# Patient Record
Sex: Male | Born: 1990 | Race: Black or African American | Hispanic: No | Marital: Single | Smoking: Current every day smoker
Health system: Southern US, Community
[De-identification: ages and names within clinical notes are randomized; demographics above are authoritative.]

## PROBLEM LIST (undated history)

## (undated) ENCOUNTER — Emergency Department (HOSPITAL_COMMUNITY): Discharge status: Absent without leave | Payer: PRIVATE HEALTH INSURANCE

## (undated) DIAGNOSIS — K121 Other forms of stomatitis: Secondary | ICD-10-CM

## (undated) DIAGNOSIS — Z22322 Carrier or suspected carrier of Methicillin resistant Staphylococcus aureus: Secondary | ICD-10-CM

## (undated) DIAGNOSIS — K1379 Other lesions of oral mucosa: Secondary | ICD-10-CM

## (undated) DIAGNOSIS — K1230 Oral mucositis (ulcerative), unspecified: Secondary | ICD-10-CM

## (undated) DIAGNOSIS — B009 Herpesviral infection, unspecified: Secondary | ICD-10-CM

## (undated) DIAGNOSIS — M352 Behcet's disease: Secondary | ICD-10-CM

## (undated) HISTORY — PX: BIOPSY PHARYNX: SUR141

---

## 2010-11-26 ENCOUNTER — Emergency Department (HOSPITAL_BASED_OUTPATIENT_CLINIC_OR_DEPARTMENT_OTHER)
Admission: EM | Admit: 2010-11-26 | Discharge: 2010-11-26 | Disposition: A | Discharge status: Home / Self Care | Payer: Medicaid Other | Attending: Emergency Medicine | Admitting: Emergency Medicine

## 2010-11-26 DIAGNOSIS — L989 Disorder of the skin and subcutaneous tissue, unspecified: Secondary | ICD-10-CM | POA: Insufficient documentation

## 2010-11-26 DIAGNOSIS — B009 Herpesviral infection, unspecified: Secondary | ICD-10-CM | POA: Insufficient documentation

## 2011-07-08 ENCOUNTER — Emergency Department (HOSPITAL_BASED_OUTPATIENT_CLINIC_OR_DEPARTMENT_OTHER)
Admission: EM | Admit: 2011-07-08 | Discharge: 2011-07-08 | Disposition: A | Discharge status: Home / Self Care | Payer: Self-pay | Attending: Emergency Medicine | Admitting: Emergency Medicine

## 2011-07-08 ENCOUNTER — Encounter: Payer: Self-pay | Admitting: *Deleted

## 2011-07-08 DIAGNOSIS — F17200 Nicotine dependence, unspecified, uncomplicated: Secondary | ICD-10-CM | POA: Insufficient documentation

## 2011-07-08 DIAGNOSIS — H00019 Hordeolum externum unspecified eye, unspecified eyelid: Secondary | ICD-10-CM | POA: Insufficient documentation

## 2011-07-08 MED ORDER — DOXYCYCLINE HYCLATE 100 MG PO CAPS: 100.0000 mg | ORAL_CAPSULE | Freq: Two times a day (BID) | ORAL | Status: AC

## 2011-07-08 MED ORDER — TOBRAMYCIN 0.3 % OP SOLN: 1.0000 [drp] | OPHTHALMIC | Status: AC

## 2011-07-08 NOTE — ED Provider Notes (Signed)
History     CSN: [id number] Arrival date & time: 07/08/2011  7:25 PM  Chief Complaint  Patient presents with  . Stye    (Consider location/radiation/quality/duration/timing/severity/associated sxs/prior treatment) Patient is a 20 y.o. male presenting with eye problem. The history is provided by the patient and the spouse. No language interpreter was used.  Eye Problem  This is a new problem. The current episode started more than 2 days ago. The problem occurs constantly. The problem has been gradually worsening. There is pain in the left eye. There was no injury mechanism. The pain is at a severity of 6/10. The pain is moderate. There is no history of trauma to the eye. There is no known exposure to pink eye. He does not wear contacts. Associated symptoms include eye redness. He has tried nothing for the symptoms. The treatment provided no relief.  Pt complains of swelling to left eyelid, pt has drainage from eye. Pt has a history of mrsa.  History reviewed. No pertinent past medical history.  History reviewed. No pertinent past surgical history.  History reviewed. No pertinent family history.  History  Substance Use Topics  . Smoking status: Current Some Day Smoker  . Smokeless tobacco: Not on file  . Alcohol Use: No      Review of Systems  Unable to perform ROS Eyes: Positive for redness.  All other systems reviewed and are negative.    Allergies  Review of patient's allergies indicates no known allergies.  Home Medications   Current Outpatient Rx  Name Route Sig Dispense Refill  . VALACYCLOVIR HCL 1 G PO TABS Oral Take 1,000 mg by mouth 2 (two) times daily.      Marland Kitchen VALACYCLOVIR HCL 500 MG PO TABS Oral Take 500 mg by mouth daily.        BP 141/91  Pulse 79  Temp(Src) 98.1 F (36.7 C) (Oral)  Resp 16  SpO2 100%  Physical Exam  Nursing note and vitals reviewed. Constitutional: He appears well-developed and well-nourished.  HENT:  Head: Normocephalic and  atraumatic.  Eyes: Conjunctivae and EOM are normal. Pupils are equal, round, and reactive to light. Left eye exhibits discharge.       Erythematous, swollen arealeft upper eyelid  Neck: Normal range of motion. Neck supple.  Cardiovascular: Normal rate.   Pulmonary/Chest: Effort normal.  Musculoskeletal: Normal range of motion.  Neurological: He is alert.  Skin: Skin is warm and dry.  Psychiatric: He has a normal mood and affect.    ED Course  Procedures (including critical care time)  Labs Reviewed - No data to display No results found.   No diagnosis found.    MDM  Pt counseled on stye and need fot follow up.        Langston Masker, [place] 07/08/11 2025

## 2011-07-08 NOTE — ED Notes (Signed)
Pt with left eye irritation x 2 weeks pt with drainage

## 2011-07-09 NOTE — ED Provider Notes (Signed)
Medical screening examination/treatment/procedure(s) were performed by non-physician practitioner and as supervising physician I was immediately available for consultation/collaboration.   Labarron Durnin A Glendon Dunwoody, MD 07/09/11 1746 

## 2011-07-17 ENCOUNTER — Emergency Department (HOSPITAL_COMMUNITY)
Admission: EM | Admit: 2011-07-17 | Discharge: 2011-07-17 | Disposition: A | Discharge status: Home / Self Care | Payer: Self-pay | Attending: Emergency Medicine | Admitting: Emergency Medicine

## 2011-07-17 DIAGNOSIS — B009 Herpesviral infection, unspecified: Secondary | ICD-10-CM | POA: Insufficient documentation

## 2011-07-17 LAB — URINALYSIS, ROUTINE W REFLEX MICROSCOPIC
Glucose, UA: NEGATIVE mg/dL
Ketones, ur: 15 mg/dL — AB
pH: 6 (ref 5.0–8.0)

## 2011-07-17 LAB — URINE MICROSCOPIC-ADD ON

## 2011-07-18 LAB — URINE CULTURE

## 2011-07-18 LAB — GC/CHLAMYDIA PROBE AMP, GENITAL: GC Probe Amp, Genital: NEGATIVE

## 2012-03-02 ENCOUNTER — Emergency Department (HOSPITAL_BASED_OUTPATIENT_CLINIC_OR_DEPARTMENT_OTHER)
Admission: EM | Admit: 2012-03-02 | Discharge: 2012-03-02 | Disposition: A | Discharge status: Home / Self Care | Payer: Self-pay | Attending: Emergency Medicine | Admitting: Emergency Medicine

## 2012-03-02 ENCOUNTER — Encounter (HOSPITAL_BASED_OUTPATIENT_CLINIC_OR_DEPARTMENT_OTHER): Payer: Self-pay | Admitting: Emergency Medicine

## 2012-03-02 DIAGNOSIS — K1370 Unspecified lesions of oral mucosa: Secondary | ICD-10-CM | POA: Insufficient documentation

## 2012-03-02 DIAGNOSIS — K121 Other forms of stomatitis: Secondary | ICD-10-CM

## 2012-03-02 DIAGNOSIS — Z8614 Personal history of Methicillin resistant Staphylococcus aureus infection: Secondary | ICD-10-CM | POA: Insufficient documentation

## 2012-03-02 DIAGNOSIS — F17200 Nicotine dependence, unspecified, uncomplicated: Secondary | ICD-10-CM | POA: Insufficient documentation

## 2012-03-02 HISTORY — DX: Carrier or suspected carrier of methicillin resistant Staphylococcus aureus: Z22.322

## 2012-03-02 MED ORDER — ACYCLOVIR 400 MG PO TABS: 400.0000 mg | ORAL_TABLET | Freq: Three times a day (TID) | ORAL | Status: AC

## 2012-03-02 MED ORDER — LIDOCAINE VISCOUS 2 % MT SOLN: 20.0000 mL | OROMUCOSAL | Status: AC | PRN

## 2012-03-02 MED ORDER — IBUPROFEN 800 MG PO TABS: 800.0000 mg | ORAL_TABLET | Freq: Three times a day (TID) | ORAL | Status: AC

## 2012-03-02 NOTE — ED Notes (Signed)
Pt having mouth ulcers for three days.  Pt states he has been tested in the past and was told he had MRSA.  Pt states ulcers are painful and difficult to eat.

## 2012-03-02 NOTE — ED Provider Notes (Signed)
History     CSN: [id number]  Arrival date & time 03/02/12  1141   First MD Initiated Contact with Patient 03/02/12 1206      12:10 PM Pt reports oral lesions forming for 3 days. States initially felt like burning in his moth. Reports previously diagnosed with oral herpes. States usually lesions begin in his mouth and then start on his lips. States he does not have a pcp after losing his insurance.  Patient is a 21 y.o. male presenting with mouth sores.  Mouth Lesions  The current episode started 3 to 5 days ago. The onset was gradual. The problem occurs continuously. The problem has been gradually worsening. The problem is moderate. The symptoms are relieved by nothing. The symptoms are aggravated by eating and drinking. Associated symptoms include mouth sores. Pertinent negatives include no fever, no nausea, no vomiting, no congestion, no headaches, no rhinorrhea, no sore throat, no swollen glands and no cough. He has been drinking less than usual and eating less than usual.    Past Medical History  Diagnosis Date  . Mouth ulcers   . MRSA (methicillin resistant staph aureus) culture positive     History reviewed. No pertinent past surgical history.  History reviewed. No pertinent family history.  History  Substance Use Topics  . Smoking status: Current Some Day Smoker  . Smokeless tobacco: Not on file  . Alcohol Use: No      Review of Systems  Constitutional: Negative for fever.  HENT: Positive for mouth sores. Negative for congestion, sore throat, rhinorrhea and trouble swallowing.   Respiratory: Negative for cough.   Gastrointestinal: Negative for nausea and vomiting.  Neurological: Negative for headaches.  All other systems reviewed and are negative.    Allergies  Review of patient's allergies indicates no known allergies.  Home Medications  No current outpatient prescriptions on file.  BP 127/99  Pulse 84  Temp(Src) 97.9 F (36.6 C) (Oral)  Resp 14  Ht 6'  (1.829 m)  Wt 155 lb (70.308 kg)  BMI 21.02 kg/m2  SpO2 100%  Physical Exam  Constitutional: He is oriented to person, place, and time. He appears well-developed and well-nourished.  HENT:  Head: Normocephalic and atraumatic.  Right Ear: Tympanic membrane, external ear and ear canal normal.  Left Ear: Tympanic membrane, external ear and ear canal normal.  Nose: Nose normal.  Mouth/Throat: Oropharynx is clear and moist and mucous membranes are normal. Oral lesions (small blister on right tongue. Small ulcerations on right buccal mucosa. ) present.  Eyes: Pupils are equal, round, and reactive to light.  Neurological: He is alert and oriented to person, place, and time.  Skin: Skin is warm and dry. No rash noted. No erythema. No pallor.  Psychiatric: He has a normal mood and affect. His behavior is normal.    ED Course  Procedures   MDM   Treat patient with acyclovir, viscous lidocaine, and resource guide and referral for community clinic in [facility]. Advised preventive measures at the first sign of herpes outbreak but he must followup with his primary care physician. Patient and family agree with plan and are ready for discharge     Thomasene Lot, Cordelia Poche 03/02/12 1240

## 2012-03-02 NOTE — Discharge Instructions (Signed)
RESOURCE GUIDE  Dental Problems  Patients with Medicaid: [facility]                     [phone] [address]                                           Phone:  [phone]                                                  If unable to pay or uninsured, contact:  [facility] or [facility]. to become qualified for the adult dental clinic.  Chronic Pain Problems Contact [facility]  [phone] Patients need to be referred by their primary care doctor.  Insufficient Money for Medicine Contact [employer] Way:  call "211" or [facility] [phone].  No Primary Care Doctor Call [facility]  [phone] Other agencies that provide inexpensive medical care    [facility]  [phone]    [facility]  [phone]    [facility]  [phone]    [facility]  [phone]    [facility]  [phone]    [facility]  [phone]  [facility] clinic of [facility] [address] [place], [place] [address] Telephone number [phone]  Substance Abuse Resources [facility] Drug Services  [phone] [facility] [phone] [facility] [phone] [facility] [phone] Residential & Outpatient Substance Abuse Program  [phone]  Psychological Services [facility] Behavioral Health  [phone] [facility]  [phone] [facility]   [phone] (emergency services [phone])  Abuse/Neglect [facility] Child Abuse Hotline [phone] [facility] Child Abuse Hotline [phone] (After Hours)  Emergency Shelter [facility] [phone]  Maternity Homes [facility] [place] [facility] [phone] [facility] [phone]  MRSA Hotline #:   401-130-0138    [facility] Resources  [facility]  [employer]                           [facility] Dept. [address]  [place]                     [address]         [address] [place] [address]  Blondell Reveal Phone:  [phone]                                  Phone:  [phone]                   Phone:  [phone]  [facility] Mental Health Phone:  [phone]  [facility] Child Abuse Hotline [phone] [phone] (After Hours)

## 2012-03-03 NOTE — ED Provider Notes (Signed)
History/physical exam/procedure(s) were performed by non-physician practitioner and as supervising physician I was immediately available for consultation/collaboration. I have reviewed all notes and am in agreement with care and plan.   Hilario Quarry, MD 03/03/12 [phone]

## 2012-03-08 ENCOUNTER — Encounter (HOSPITAL_BASED_OUTPATIENT_CLINIC_OR_DEPARTMENT_OTHER): Payer: Self-pay

## 2012-03-08 ENCOUNTER — Emergency Department (HOSPITAL_BASED_OUTPATIENT_CLINIC_OR_DEPARTMENT_OTHER)
Admission: EM | Admit: 2012-03-08 | Discharge: 2012-03-08 | Disposition: A | Discharge status: Home / Self Care | Payer: Self-pay | Attending: Emergency Medicine | Admitting: Emergency Medicine

## 2012-03-08 DIAGNOSIS — Z8614 Personal history of Methicillin resistant Staphylococcus aureus infection: Secondary | ICD-10-CM | POA: Insufficient documentation

## 2012-03-08 DIAGNOSIS — F17200 Nicotine dependence, unspecified, uncomplicated: Secondary | ICD-10-CM | POA: Insufficient documentation

## 2012-03-08 DIAGNOSIS — K1379 Other lesions of oral mucosa: Secondary | ICD-10-CM

## 2012-03-08 DIAGNOSIS — K1370 Unspecified lesions of oral mucosa: Secondary | ICD-10-CM | POA: Insufficient documentation

## 2012-03-08 HISTORY — DX: Herpesviral infection, unspecified: B00.9

## 2012-03-08 MED ORDER — HYDROCODONE-ACETAMINOPHEN 5-325 MG PO TABS: 2.0000 | ORAL_TABLET | ORAL | Status: DC | PRN

## 2012-03-08 MED ORDER — HYDROCODONE-ACETAMINOPHEN 5-325 MG PO TABS
2.0000 | ORAL_TABLET | Freq: Once | ORAL | Status: AC
  Administered 2012-03-08: 2 via ORAL
  Filled 2012-03-08: qty 2

## 2012-03-08 NOTE — ED Provider Notes (Signed)
History     CSN: [id number]  Arrival date & time 03/08/12  1948   First MD Initiated Contact with Patient 03/08/12 2009      Chief Complaint  Patient presents with  . Mouth Lesions    (Consider location/radiation/quality/duration/timing/severity/associated sxs/prior treatment) Patient is a 21 y.o. male presenting with mouth sores. The history is provided by the patient. No language interpreter was used.  Mouth Lesions  The current episode started 5 to 7 days ago. The problem occurs continuously. The problem has been rapidly worsening. The problem is moderate. The symptoms are relieved by nothing. The symptoms are aggravated by nothing. Associated symptoms include mouth sores. He has been drinking less than usual.  Pt complains of a mouth pain.   Pt has a history of oral herpes.   Pt reports worse today,  Can not drink because of pain.   Past Medical History  Diagnosis Date  . Mouth ulcers   . MRSA (methicillin resistant staph aureus) culture positive   . Herpes     History reviewed. No pertinent past surgical history.  No family history on file.  History  Substance Use Topics  . Smoking status: Current Everyday Smoker  . Smokeless tobacco: Not on file  . Alcohol Use: No      Review of Systems  HENT: Positive for mouth sores.   Skin: Positive for wound.  All other systems reviewed and are negative.    Allergies  Review of patient's allergies indicates no known allergies.  Home Medications   Current Outpatient Rx  Name Route Sig Dispense Refill  . ACYCLOVIR 400 MG PO TABS Oral Take 1 tablet (400 mg total) by mouth 3 (three) times daily. 30 tablet 0  . IBUPROFEN 800 MG PO TABS Oral Take 1 tablet (800 mg total) by mouth 3 (three) times daily. 21 tablet 0  . LIDOCAINE VISCOUS 2 % MT SOLN Oral Take 20 mLs by mouth as needed for pain. 100 mL 0    Gargle swish and spit out q 3 hours, prn pain     BP 111/84  Pulse 106  Temp(Src) 98.4 F (36.9 C) (Oral)  Resp 16   Ht 6' (1.829 m)  Wt 140 lb (63.504 kg)  BMI 18.99 kg/m2  SpO2 98%  Physical Exam  Nursing note and vitals reviewed. Constitutional: He is oriented to person, place, and time. He appears well-developed and well-nourished.  HENT:  Head: Normocephalic and atraumatic.  Nose: Nose normal.       Multiple oral lesions scabbed areas and new blisters,  Eyes: Conjunctivae and EOM are normal. Pupils are equal, round, and reactive to light.  Neck: Normal range of motion. Neck supple.  Cardiovascular: Normal rate and normal heart sounds.   Pulmonary/Chest: Effort normal.  Musculoskeletal: Normal range of motion.  Neurological: He is alert and oriented to person, place, and time.  Skin: Skin is warm and dry.  Psychiatric: He has a normal mood and affect.    ED Course  Procedures (including critical care time)  Labs Reviewed - No data to display No results found.   No diagnosis found.    MDM  Cont acyclovir.   Pt given 2 vicodin and rx for 10.   Primary care referrals and note for job.        Lonia Skinner Glendo, [place] 03/08/12 2025

## 2012-03-08 NOTE — ED Notes (Signed)
C/o mouth lesions since 02/28/12-was seen here taking meds but states he can not eat or drink and can not work-wife is talking on pt behalf due to pt c/o pain when talks

## 2012-03-08 NOTE — Discharge Instructions (Signed)
Oral Ulcers  Oral ulcers are painful, shallow sores around the lining of the mouth. They can affect the gums, the inside of the lips and the cheeks (sores on the outside of the lips and on the face are different). They typically first occur in school aged children and teenagers. Oral ulcers may also be called canker sores or cold sores.  CAUSES   Canker sores and cold sores can be caused by many factors including:  · Infection.  · Injury.  · Sun exposure.  · Medications.  · Emotional stress.  · Food allergies.  · Vitamin deficiencies.  · Toothpastes containing sodium lauryl sulfate.  The Herpes Virus can be the cause of mouth ulcers. The first infection can be severe and cause 10 or more ulcers on the gums, tongue and lips with fever and difficulty in swallowing. This infection usually occurs between the ages of 1 and 3 years.   SYMPTOMS   The typical sore is about ¼ inch (6 mm) in size, is an oval or round ulcer with red borders.  DIAGNOSIS   Your caregiver can diagnose simple oral ulcers by examination. Additional testing is usually not required.   TREATMENT   Treatment is aimed at pain relief. Generally, oral ulcers resolve by themselves within 1 to 2 weeks without medication and are not contagious unless caused by Herpes (and other viruses). Antibiotics are not effective with mouth sores. Avoid direct contact with others until the ulcer is completely healed. See your caregiver for follow-up care as recommended. Also:  · Offer a soft diet.  · Encourage plenty of fluids to prevent dehydration. Popsicles and milk shakes can be helpful.  · Avoid acidic and salty foods and drinks such as orange juice.  · Infants and young children will often refuse to drink because of pain. Using a teaspoon, cup or syringe to give small amounts of fluids frequently can help prevent dehydration.  · Cold compresses on the face may help reduce pain.  · Pain medication can help control soreness.  · A solution of diphenhydramine mixed  with a liquid antacid can be useful to decrease the soreness of ulcers. Consult a caregiver for the dosing.  · Liquids or ointments with a numbing ingredient may be helpful when used as recommended.  · Older children and teenagers can rinse their mouth with a salt-water mixture (1/2 teaspoonof salt in 8 ounces of water) four times a day. This treatment is uncomfortable but may reduce the time the ulcers are present.  · There are many over the counter throat lozenges and medications available for oral ulcers. There effectiveness has not been studied.  · Consult your medical caregiver prior to using homeopathic treatments for oral ulcers.  SEEK MEDICAL CARE IF:   · You think your child needs to be seen.  · The pain worsens and you cannot control it.  · There are 4 or more ulcers.  · The lips and gums begin to bleed and crust.  · A single mouth ulcer is near a tooth that is causing a toothache or pain.  · Your child has a fever, swollen face, or swollen glands.  · The ulcers began after starting a medication.  · Mouth ulcers keep re-occurring or last more than 2 weeks.  · You think your child is not taking adequate fluids.  SEEK IMMEDIATE MEDICAL CARE IF:   · Your child has a high fever.  · Your child is unable to swallow or becomes dehydrated.  ·   a chemical your child accidentally put in their mouth.  Document Released: 11/02/2004 Document Revised: 09/14/2011 Document Reviewed: 06/17/2009 [facility] Patient Information 2012 [place], [place]. Go to www.goodrx.com to look up your medications. This will give you a list of where you can find your prescriptions at the most affordable prices.   Call [facility]  [phone]  If you have no primary doctor, here are some resources that may be helpful:  Medicaid-accepting [facility] Providers:   - [facility]- [address]      [phone]      Mon-Fri 9am-7pm, Sat 9am-1pm   - [facility]- [address] [place]      [phone]   - [facility]- [address] [place]      [phone]   [facility]- [address]      [phone]   - Renaye Rakers- [address] [place], [address]      [phone]      Only accepts [place] patients       after they have her name applied to their card   Self Pay (no insurance) in [place]:   - Sickle Cell Patients: Dr Willey Blade, [facility]      [address] [place]      [phone]   - [facility] [phone]   - Physician Referral Service- [phone]   - [facility]- [address] [place]      [phone]   [facility]- [address]   - [facility]- see information above      (Speak to [employer] if you do not have insurance)   - [facility]- [address] [place]      [phone]   - [facility] [place]- [address]      [phone]   - [facility]- [address]      [phone]   - Dr Julio Sicks-  [address], [place]      [phone]   - [facility]- [address]      [phone]   - [facility]- [address]      [phone]      Also [address]      [phone]   - [facility]- [address]      [phone]      1st and 3rd Saturday every month, 10am-1pm    Other agencies that provide inexpensive medical care:     [facility]  [phone]    [facility] Internal Medicine  [phone]    [facility]  [phone]    [facility]  [phone] [address] [place] [address]    [facility]  [phone]    [facility]  [phone] [facility] [phone]   [address] [place], [place] [address]  Chronic Pain Problems Contact [facility]  [phone] Patients need to be referred by their primary care doctor.  [facility] of [place]     [employer]  [facility] Dept. [address] [place]                       [address]      [address] [place] [address]   [phone] (After Hours)  General Information: Finding a doctor when you do not have health insurance can be tricky. Although you are not limited by an insurance plan, you are of course limited by her finances and how much but he can pay out of pocket.  What are your options if you don't have health insurance?   1) Find a Librarian, academic and Pay Out of Pocket Although you won't have to find out who is covered by your insurance plan, it is a good idea to ask around and get recommendations. You will then need to call the office and see if the doctor you have chosen will accept you as a new patient and what types of options they offer for patients who are self-pay. Some doctors offer discounts or will set up payment plans for their patients who do not have insurance, but you will need to ask so you aren't surprised when you get to your appointment.  2) Contact Your Local Health Department Not all health departments have doctors that can see patients for sick visits, but many do, so it is worth a call to see if yours does. If you don't know where your local health department is, you can check in your phone book. The CDC also has a tool to help you locate your state's health department, and many state websites also have listings of all of their local health departments.  3) Find a Walk-in Clinic If your illness is not likely to be very severe or complicated, you may want to try a walk in clinic. These are popping up all over the country in pharmacies, drugstores, and shopping centers. They're usually staffed by nurse practitioners or physician assistants that have been trained to treat  common illnesses and complaints. They're usually fairly quick and inexpensive. However, if you have serious medical issues or chronic medical problems, these are probably not your best option

## 2012-03-08 NOTE — ED Provider Notes (Signed)
Medical screening examination/treatment/procedure(s) were performed by non-physician practitioner and as supervising physician I was immediately available for consultation/collaboration.  Evadne Ose, MD 03/08/12 2050 

## 2012-03-08 NOTE — ED Notes (Signed)
Per patient's wife, pt has had these lesions since he was 23 and they have always gone away with medication, pt states he has been taking the acyclovir medication and using lidocaine but patient cont. To be unable to eat and drink

## 2012-04-17 ENCOUNTER — Emergency Department (HOSPITAL_BASED_OUTPATIENT_CLINIC_OR_DEPARTMENT_OTHER)
Admission: EM | Admit: 2012-04-17 | Discharge: 2012-04-17 | Disposition: A | Discharge status: Home / Self Care | Payer: Self-pay | Attending: Emergency Medicine | Admitting: Emergency Medicine

## 2012-04-17 ENCOUNTER — Encounter (HOSPITAL_BASED_OUTPATIENT_CLINIC_OR_DEPARTMENT_OTHER): Payer: Self-pay | Admitting: *Deleted

## 2012-04-17 DIAGNOSIS — F17200 Nicotine dependence, unspecified, uncomplicated: Secondary | ICD-10-CM | POA: Insufficient documentation

## 2012-04-17 DIAGNOSIS — Z8614 Personal history of Methicillin resistant Staphylococcus aureus infection: Secondary | ICD-10-CM | POA: Insufficient documentation

## 2012-04-17 DIAGNOSIS — B009 Herpesviral infection, unspecified: Secondary | ICD-10-CM | POA: Insufficient documentation

## 2012-04-17 MED ORDER — HYDROCODONE-ACETAMINOPHEN 5-500 MG PO TABS: 1.0000 | ORAL_TABLET | Freq: Four times a day (QID) | ORAL | Status: AC | PRN

## 2012-04-17 MED ORDER — ACYCLOVIR 400 MG PO TABS: 400.0000 mg | ORAL_TABLET | Freq: Four times a day (QID) | ORAL | Status: AC

## 2012-04-17 NOTE — ED Notes (Signed)
MD at bedside. 

## 2012-04-17 NOTE — ED Notes (Signed)
Pt c/o herpes in mouth , seen here x 1 month ago for same

## 2012-04-17 NOTE — ED Provider Notes (Signed)
History     CSN: [id number]  Arrival date & time 04/17/12  2018   First MD Initiated Contact with Patient 04/17/12 2106      Chief Complaint  Patient presents with  . Herpes Zoster    (Consider location/radiation/quality/duration/timing/severity/associated sxs/prior treatment) HPI Comments: Patient presents with complaints of mouth pain. He has a history of oral herpes and presents with about a 3 week history of new oral lesions. He was treated in May with acyclovir for the same lesions. He states she's had multiple recurrent episodes of oral herpes. He has blistering on his tongue and soreness on his tongue with difficulty eating due to the blistering and soreness. He denies any sore throat, fever, vomiting, diarrhea, or other skin lesions. Denies any genital lesion  The history is provided by the patient.    Past Medical History  Diagnosis Date  . Mouth ulcers   . MRSA (methicillin resistant staph aureus) culture positive   . Herpes     History reviewed. No pertinent past surgical history.  History reviewed. No pertinent family history.  History  Substance Use Topics  . Smoking status: Current Everyday Smoker  . Smokeless tobacco: Not on file  . Alcohol Use: No      Review of Systems  Constitutional: Negative for fever, chills, diaphoresis and fatigue.  HENT: Positive for mouth sores. Negative for congestion, rhinorrhea and sneezing.   Eyes: Negative.   Respiratory: Negative for cough, chest tightness and shortness of breath.   Cardiovascular: Negative for chest pain and leg swelling.  Gastrointestinal: Negative for nausea, vomiting, abdominal pain, diarrhea and blood in stool.  Genitourinary: Negative for frequency, hematuria, flank pain and difficulty urinating.  Musculoskeletal: Negative for back pain and arthralgias.  Skin: Negative for rash.  Neurological: Negative for dizziness, speech difficulty, weakness, numbness and headaches.    Allergies  Review of  patient's allergies indicates no known allergies.  Home Medications   Current Outpatient Rx  Name Route Sig Dispense Refill  . VALACYCLOVIR HCL 1 G PO TABS Oral Take 1,000 mg by mouth 2 (two) times daily.    . ACYCLOVIR 400 MG PO TABS Oral Take 1 tablet (400 mg total) by mouth 4 (four) times daily. 20 tablet 0  . HYDROCODONE-ACETAMINOPHEN 5-500 MG PO TABS Oral Take 1-2 tablets by mouth every 6 (six) hours as needed for pain. 15 tablet 0    BP 117/86  Pulse 71  Temp 98 F (36.7 C) (Oral)  Ht 6' (1.829 m)  Wt 150 lb (68.04 kg)  BMI 20.34 kg/m2  SpO2 100%  Physical Exam  Constitutional: He is oriented to person, place, and time. He appears well-developed and well-nourished.  HENT:  Head: Normocephalic and atraumatic.  Mouth/Throat: Oropharynx is clear and moist.       Patient has multiple blisters on his tongue. Some are older and scabbed over and some are  fluid filled blisters.  Eyes: Pupils are equal, round, and reactive to light.  Neck: Normal range of motion. Neck supple.  Cardiovascular: Normal rate, regular rhythm and normal heart sounds.   Pulmonary/Chest: Effort normal and breath sounds normal. No respiratory distress. He has no wheezes. He has no rales. He exhibits no tenderness.  Abdominal: Soft. Bowel sounds are normal. There is no tenderness. There is no rebound and no guarding.  Musculoskeletal: Normal range of motion. He exhibits no edema.  Lymphadenopathy:    He has no cervical adenopathy.  Neurological: He is alert and oriented to person, place, and  time.  Skin: Skin is warm and dry. No rash noted.  Psychiatric: He has a normal mood and affect.    ED Course  Procedures (including critical care time)  Labs Reviewed - No data to display No results found.   1. Herpes simplex virus infection       MDM  Patient presents with a recurrence of his oral herpes. Will start acyclovir. He does not appear to be any distress. Does not appear to be dehydrated. He  has no difficulty swallowing or controlling his secretions        [name], MD 04/17/12 2209

## 2012-04-17 NOTE — ED Notes (Signed)
DR Belfi at bedside. 

## 2012-06-16 ENCOUNTER — Encounter (HOSPITAL_BASED_OUTPATIENT_CLINIC_OR_DEPARTMENT_OTHER): Payer: Self-pay | Admitting: *Deleted

## 2012-06-16 DIAGNOSIS — K6289 Other specified diseases of anus and rectum: Secondary | ICD-10-CM | POA: Insufficient documentation

## 2012-06-16 NOTE — ED Notes (Signed)
Pt. States that he had  "a little" rectal bleeding today after he had a bowel movment with pain to the rectal area.

## 2012-06-17 ENCOUNTER — Emergency Department (HOSPITAL_BASED_OUTPATIENT_CLINIC_OR_DEPARTMENT_OTHER)
Admission: EM | Admit: 2012-06-17 | Discharge: 2012-06-17 | Disposition: A | Discharge status: Home / Self Care | Payer: Self-pay | Attending: Emergency Medicine | Admitting: Emergency Medicine

## 2012-07-03 ENCOUNTER — Emergency Department (HOSPITAL_BASED_OUTPATIENT_CLINIC_OR_DEPARTMENT_OTHER)
Admission: EM | Admit: 2012-07-03 | Discharge: 2012-07-03 | Disposition: A | Discharge status: Home / Self Care | Payer: Medicaid Other | Attending: Emergency Medicine | Admitting: Emergency Medicine

## 2012-07-03 ENCOUNTER — Encounter (HOSPITAL_BASED_OUTPATIENT_CLINIC_OR_DEPARTMENT_OTHER): Payer: Self-pay

## 2012-07-03 DIAGNOSIS — Z8614 Personal history of Methicillin resistant Staphylococcus aureus infection: Secondary | ICD-10-CM | POA: Insufficient documentation

## 2012-07-03 DIAGNOSIS — B001 Herpesviral vesicular dermatitis: Secondary | ICD-10-CM

## 2012-07-03 DIAGNOSIS — F17200 Nicotine dependence, unspecified, uncomplicated: Secondary | ICD-10-CM | POA: Insufficient documentation

## 2012-07-03 DIAGNOSIS — B009 Herpesviral infection, unspecified: Secondary | ICD-10-CM | POA: Insufficient documentation

## 2012-07-03 MED ORDER — OXYCODONE-ACETAMINOPHEN 5-325 MG PO TABS: 1.0000 | ORAL_TABLET | ORAL | Status: DC | PRN

## 2012-07-03 MED ORDER — ACYCLOVIR 400 MG PO TABS: 400.0000 mg | ORAL_TABLET | Freq: Three times a day (TID) | ORAL | Status: DC

## 2012-07-03 NOTE — ED Notes (Signed)
Patient here with reported outbreak of herpes x 3 days. Reports only in mouth and lips, pain with speaking and swallowing

## 2012-07-03 NOTE — ED Provider Notes (Addendum)
History     CSN: [id number]  Arrival date & time 07/03/12  1507   First MD Initiated Contact with Patient 07/03/12 1556      Chief Complaint  Patient presents with  . Rash    (Consider location/radiation/quality/duration/timing/severity/associated sxs/prior treatment) Patient is a 21 y.o. male presenting with rash. The history is provided by the patient.  Rash   He has an outbreak of oral herpes infection which has been present for the last 3 days. He has had numerous episodes of the same infection. Oral antiviral medication is usually effective. Pain is severe and he rates it at 8/10. He is not having any difficulty swallowing. He denies fever or chills.  Past Medical History  Diagnosis Date  . Mouth ulcers   . MRSA (methicillin resistant staph aureus) culture positive   . Herpes     History reviewed. No pertinent past surgical history.  No family history on file.  History  Substance Use Topics  . Smoking status: Current Every Day Smoker  . Smokeless tobacco: Not on file  . Alcohol Use: Yes     occ      Review of Systems  Skin: Positive for rash.  All other systems reviewed and are negative.    Allergies  Review of patient's allergies indicates no known allergies.  Home Medications   Current Outpatient Rx  Name Route Sig Dispense Refill  . VALACYCLOVIR HCL 1 G PO TABS Oral Take 1,000 mg by mouth 2 (two) times daily.      BP 116/79  Pulse 84  Temp 97.6 F (36.4 C)  Resp 16  Wt 150 lb (68.04 kg)  SpO2 99%  Physical Exam  Nursing note and vitals reviewed. 21 year old male, resting comfortably and in no acute distress. Vital signs are normal. Oxygen saturation is 99%, which is normal. Head is normocephalic and atraumatic. PERRLA, EOMI. Oropharynx is clear. Numerous vesicles are present on the lips with mild swelling of the lips. This is consistent with the herpes labialis. Pharynx has no swelling and there no intraoral lesions. Neck is nontender and  supple without adenopathy or JVD. Back is nontender and there is no CVA tenderness. Lungs are clear without rales, wheezes, or rhonchi. Chest is nontender. Heart has regular rate and rhythm without murmur. Abdomen is soft, flat, nontender without masses or hepatosplenomegaly and peristalsis is normoactive. Extremities have no cyanosis or edema, full range of motion is present. Skin is warm and dry without rash. Neurologic: Mental status is normal, cranial nerves are intact, there are no motor or sensory deficits.   ED Course  Procedures (including critical care time)  Labs Reviewed - No data to display No results found.   No diagnosis found.    MDM  Recurrent oral herpes infection. Review of old records shows several ED visits with the same presentation. He is sent home with prescription for acyclovir and Percocet for pain. Discuss with him the need for evaluation for immune system problems given his recurrent infections. I have also discussed with him possible need for chronic suppressive therapy.    Dione Booze, MD 07/03/12 1615  Dione Booze, MD 07/03/12 [phone]

## 2012-07-07 ENCOUNTER — Inpatient Hospital Stay (HOSPITAL_COMMUNITY)
Admission: EM | Admit: 2012-07-07 | Discharge: 2012-07-12 | DRG: 866 | Disposition: A | Discharge status: Home / Self Care | Payer: Medicaid Other | Attending: Internal Medicine | Admitting: Internal Medicine

## 2012-07-07 ENCOUNTER — Encounter (HOSPITAL_COMMUNITY): Payer: Self-pay

## 2012-07-07 DIAGNOSIS — N179 Acute kidney failure, unspecified: Secondary | ICD-10-CM

## 2012-07-07 DIAGNOSIS — Z9119 Patient's noncompliance with other medical treatment and regimen: Secondary | ICD-10-CM

## 2012-07-07 DIAGNOSIS — R739 Hyperglycemia, unspecified: Secondary | ICD-10-CM | POA: Diagnosis present

## 2012-07-07 DIAGNOSIS — Z8614 Personal history of Methicillin resistant Staphylococcus aureus infection: Secondary | ICD-10-CM

## 2012-07-07 DIAGNOSIS — Z91199 Patient's noncompliance with other medical treatment and regimen due to unspecified reason: Secondary | ICD-10-CM

## 2012-07-07 DIAGNOSIS — F17210 Nicotine dependence, cigarettes, uncomplicated: Secondary | ICD-10-CM | POA: Diagnosis present

## 2012-07-07 DIAGNOSIS — R7309 Other abnormal glucose: Secondary | ICD-10-CM | POA: Diagnosis present

## 2012-07-07 DIAGNOSIS — E860 Dehydration: Secondary | ICD-10-CM | POA: Diagnosis present

## 2012-07-07 DIAGNOSIS — B0089 Other herpesviral infection: Secondary | ICD-10-CM

## 2012-07-07 DIAGNOSIS — A6001 Herpesviral infection of penis: Secondary | ICD-10-CM | POA: Diagnosis present

## 2012-07-07 DIAGNOSIS — F17200 Nicotine dependence, unspecified, uncomplicated: Secondary | ICD-10-CM | POA: Diagnosis present

## 2012-07-07 DIAGNOSIS — B009 Herpesviral infection, unspecified: Secondary | ICD-10-CM

## 2012-07-07 LAB — COMPREHENSIVE METABOLIC PANEL
Albumin: 4.2 g/dL (ref 3.5–5.2)
Alkaline Phosphatase: 87 U/L (ref 39–117)
BUN: 15 mg/dL (ref 6–23)
Chloride: 94 mEq/L — ABNORMAL LOW (ref 96–112)
Glucose, Bld: 80 mg/dL (ref 70–99)
Potassium: 3.9 mEq/L (ref 3.5–5.1)
Total Bilirubin: 1.3 mg/dL — ABNORMAL HIGH (ref 0.3–1.2)

## 2012-07-07 LAB — PROTIME-INR: Prothrombin Time: 13.9 seconds (ref 11.6–15.2)

## 2012-07-07 LAB — CBC
HCT: 47.4 % (ref 39.0–52.0)
Hemoglobin: 17.2 g/dL — ABNORMAL HIGH (ref 13.0–17.0)
RDW: 11.6 % (ref 11.5–15.5)
WBC: 6.8 10*3/uL (ref 4.0–10.5)

## 2012-07-07 MED ORDER — METHYLPREDNISOLONE SODIUM SUCC 125 MG IJ SOLR
125.0000 mg | Freq: Once | INTRAMUSCULAR | Status: AC
  Administered 2012-07-07: 125 mg via INTRAVENOUS
  Filled 2012-07-07: qty 2

## 2012-07-07 MED ORDER — MAGIC MOUTHWASH
5.0000 mL | Freq: Once | ORAL | Status: AC
  Administered 2012-07-07: 5 mL via ORAL
  Filled 2012-07-07: qty 5

## 2012-07-07 MED ORDER — HYDROMORPHONE HCL PF 1 MG/ML IJ SOLN
1.0000 mg | Freq: Once | INTRAMUSCULAR | Status: AC
  Administered 2012-07-07: 1 mg via INTRAVENOUS
  Filled 2012-07-07: qty 1

## 2012-07-07 MED ORDER — DEXTROSE 5 % IV SOLN
10.0000 mg/kg | Freq: Once | INTRAVENOUS | Status: AC
  Administered 2012-07-07: 680 mg via INTRAVENOUS
  Filled 2012-07-07: qty 13.6

## 2012-07-07 MED ORDER — SODIUM CHLORIDE 0.9 % IV SOLN
Freq: Once | INTRAVENOUS | Status: AC
  Administered 2012-07-07: 23:00:00 via INTRAVENOUS

## 2012-07-07 MED ORDER — ONDANSETRON HCL 4 MG/2ML IJ SOLN
4.0000 mg | Freq: Once | INTRAMUSCULAR | Status: AC
  Administered 2012-07-07: 4 mg via INTRAVENOUS
  Filled 2012-07-07: qty 2

## 2012-07-07 NOTE — ED Provider Notes (Signed)
History     CSN: [id number]  Arrival date & time 07/07/12  [place]   First MD Initiated Contact with Patient 07/07/12 2138      Chief Complaint  Patient presents with  . SEXUALLY TRANSMITTED DISEASE    Herpes per patient    (Consider location/radiation/quality/duration/timing/severity/associated sxs/prior treatment) HPI Comments: Patient was seen 3 days ago, diagnosed with oral herpes started on a cycle.  There states he is been taking his Naprosyn with worsening oral lesions throughout his entire mouth.  He also has lesions on his left eyelid and diffusely over trunk and extremities or painful.  He is unable to eat or drink except to take his medications due to pain  The history is provided by the patient and the spouse.    Past Medical History  Diagnosis Date  . Mouth ulcers   . MRSA (methicillin resistant staph aureus) culture positive   . Herpes     History reviewed. No pertinent past surgical history.  History reviewed. No pertinent family history.  History  Substance Use Topics  . Smoking status: Current Every Day Smoker -- 0.2 packs/day    Types: Cigarettes  . Smokeless tobacco: Not on file  . Alcohol Use: Yes     occ      Review of Systems  Constitutional: Positive for fever. Negative for chills.  HENT: Positive for mouth sores and trouble swallowing.   Gastrointestinal: Negative for nausea and vomiting.  Skin: Positive for rash and wound.  Neurological: Positive for weakness. Negative for dizziness and headaches.    Allergies  Review of patient's allergies indicates no known allergies.  Home Medications   No current outpatient prescriptions on file.  BP 117/76  Pulse 76  Temp 97.3 F (36.3 C) (Oral)  Resp 16  Ht 6' (1.829 m)  Wt 135 lb 2.3 oz (61.3 kg)  BMI 18.33 kg/m2  SpO2 100%  Physical Exam  Constitutional: He appears well-developed and well-nourished. He appears distressed.  HENT:  Head: Normocephalic.  Mouth/Throat: Mucous membranes  are dry. Oral lesions present. Oropharyngeal exudate present.  Eyes: Pupils are equal, round, and reactive to light.       Lesion on L upper eye lid  Neck: Normal range of motion.  Cardiovascular: Normal rate.   Pulmonary/Chest: Effort normal.  Abdominal: Soft. He exhibits no distension.  Genitourinary: Rectum normal and penis normal.  Musculoskeletal: Normal range of motion. He exhibits no edema and no tenderness.  Skin: Rash noted.       Lesion on extremities, back, face neck     ED Course  Procedures (including critical care time)  Labs Reviewed  CBC - Abnormal; Notable for the following:    Hemoglobin 17.2 (*)     MCHC 36.3 (*)     All other components within normal limits  COMPREHENSIVE METABOLIC PANEL - Abnormal; Notable for the following:    Sodium 134 (*)     Chloride 94 (*)     Total Protein 9.1 (*)     Total Bilirubin 1.3 (*)     All other components within normal limits  BASIC METABOLIC PANEL - Abnormal; Notable for the following:    Sodium 132 (*)     Chloride 95 (*)     Glucose, Bld 121 (*)     Creatinine, Ser 1.43 (*)     GFR calc non Af Amer 68 (*)     GFR calc Af Amer 79 (*)     All other components within normal  limits  PROTIME-INR  RAPID HIV SCREEN ([facility])  HERPES SIMPLEX VIRUS(HSV) DNA BY PCR  HERPES SIMPLEX VIRUS CULTURE  BASIC METABOLIC PANEL   No results found.   1. Herpes simplex with complication   2. Acute kidney injury       MDM  disseminated Herpes        Arman Filter, NP 07/08/12 2317

## 2012-07-07 NOTE — ED Notes (Signed)
Pt presents with herpes sores external to mouth and lips and genital area x 2 weeks.  Pt with increased herpes to mouth x 3 days.  Pt is able to speak and swallow.  Pt has not been complaint with medications

## 2012-07-07 NOTE — ED Notes (Signed)
Pt from home c/o oral and skin lesions. Patient unable to speak, eat or drink at this time. Sts he has not eaten in 3 days. Patient oral membranes blistered and not intact. Patient wife sts they are unable to keep his medication up to date due to the expense of acyclovir. Patient also has lesions on his arms, shoulders and his left eye and a small amount on his genitals.

## 2012-07-08 ENCOUNTER — Encounter (HOSPITAL_COMMUNITY): Payer: Self-pay

## 2012-07-08 DIAGNOSIS — N179 Acute kidney failure, unspecified: Secondary | ICD-10-CM

## 2012-07-08 DIAGNOSIS — R739 Hyperglycemia, unspecified: Secondary | ICD-10-CM | POA: Diagnosis present

## 2012-07-08 DIAGNOSIS — B0089 Other herpesviral infection: Principal | ICD-10-CM

## 2012-07-08 DIAGNOSIS — B009 Herpesviral infection, unspecified: Secondary | ICD-10-CM

## 2012-07-08 DIAGNOSIS — F17210 Nicotine dependence, cigarettes, uncomplicated: Secondary | ICD-10-CM | POA: Diagnosis present

## 2012-07-08 LAB — BASIC METABOLIC PANEL
CO2: 24 mEq/L (ref 19–32)
Chloride: 95 mEq/L — ABNORMAL LOW (ref 96–112)
Glucose, Bld: 121 mg/dL — ABNORMAL HIGH (ref 70–99)
Sodium: 132 mEq/L — ABNORMAL LOW (ref 135–145)

## 2012-07-08 MED ORDER — DEXTROSE 5 % IV SOLN
680.0000 mg | Freq: Three times a day (TID) | INTRAVENOUS | Status: DC
  Administered 2012-07-08: 680 mg via INTRAVENOUS
  Filled 2012-07-08 (×2): qty 13.6

## 2012-07-08 MED ORDER — ONDANSETRON HCL 4 MG/2ML IJ SOLN: 4.0000 mg | Freq: Four times a day (QID) | INTRAMUSCULAR | Status: DC | PRN

## 2012-07-08 MED ORDER — ACETAMINOPHEN 325 MG PO TABS: 650.0000 mg | ORAL_TABLET | Freq: Four times a day (QID) | ORAL | Status: DC | PRN

## 2012-07-08 MED ORDER — MORPHINE SULFATE 2 MG/ML IJ SOLN
2.0000 mg | INTRAMUSCULAR | Status: DC | PRN
  Administered 2012-07-08 (×2): 2 mg via INTRAVENOUS
  Filled 2012-07-08 (×2): qty 1

## 2012-07-08 MED ORDER — ACETAMINOPHEN 650 MG RE SUPP: 650.0000 mg | Freq: Four times a day (QID) | RECTAL | Status: DC | PRN

## 2012-07-08 MED ORDER — ONDANSETRON HCL 4 MG PO TABS: 4.0000 mg | ORAL_TABLET | Freq: Four times a day (QID) | ORAL | Status: DC | PRN

## 2012-07-08 MED ORDER — LIDOCAINE VISCOUS 2 % MT SOLN
20.0000 mL | OROMUCOSAL | Status: DC | PRN
  Administered 2012-07-08 (×2): 20 mL via OROMUCOSAL
  Filled 2012-07-08 (×3): qty 20

## 2012-07-08 MED ORDER — HALOPERIDOL LACTATE 5 MG/ML IJ SOLN: 2.0000 mg | Freq: Four times a day (QID) | INTRAMUSCULAR | Status: DC | PRN

## 2012-07-08 MED ORDER — SODIUM CHLORIDE 0.9 % IV SOLN
INTRAVENOUS | Status: DC
  Administered 2012-07-08: 1000 mL via INTRAVENOUS
  Administered 2012-07-09 (×2): via INTRAVENOUS
  Administered 2012-07-10: 1000 mL via INTRAVENOUS
  Administered 2012-07-10 – 2012-07-12 (×5): via INTRAVENOUS

## 2012-07-08 MED ORDER — ACYCLOVIR SODIUM 50 MG/ML IV SOLN
10.0000 mg/kg | Freq: Three times a day (TID) | INTRAVENOUS | Status: DC
  Administered 2012-07-08 – 2012-07-12 (×13): 615 mg via INTRAVENOUS
  Filled 2012-07-08 (×14): qty 12.3

## 2012-07-08 NOTE — Progress Notes (Signed)
ANTIBIOTIC CONSULT NOTE - INITIAL  Pharmacy Consult for IV Acyclovir Indication: Disseminated Herpes Simplex  No Known Allergies  Patient Measurements: Height: 6' (182.9 cm) Weight: 150 lb (68.04 kg) IBW/kg (Calculated) : 77.6    Vital Signs: Temp: 98.5 F (36.9 C) (09/30 0016) Temp src: Oral (09/30 0016) BP: 129/82 mmHg (09/30 0016) Pulse Rate: 104  (09/30 0016) Intake/Output from previous day:   Intake/Output from this shift:    Labs:  Basename 07/07/12 2312  WBC 6.8  HGB 17.2*  PLT 207  LABCREA --  CREATININE 1.02   Estimated Creatinine Clearance: 108.3 ml/min (by C-G formula based on Cr of 1.02). No results found for this basename: VANCOTROUGH:2,VANCOPEAK:2,VANCORANDOM:2,GENTTROUGH:2,GENTPEAK:2,GENTRANDOM:2,TOBRATROUGH:2,TOBRAPEAK:2,TOBRARND:2,AMIKACINPEAK:2,AMIKACINTROU:2,AMIKACIN:2, in the last 72 hours   Microbiology: No results found for this or any previous visit (from the past 720 hour(s)).  Medical History: Past Medical History  Diagnosis Date  . Mouth ulcers   . MRSA (methicillin resistant staph aureus) culture positive   . Herpes     Medications:  Scheduled:    . sodium chloride   Intravenous Once  . acyclovir  10 mg/kg Intravenous Once  .  HYDROmorphone (DILAUDID) injection  1 mg Intravenous Once  . magic mouthwash  5 mL Oral Once  . methylPREDNISolone (SOLU-MEDROL) injection  125 mg Intravenous Once  . ondansetron  4 mg Intravenous Once   Infusions:    . sodium chloride     Assessment:  21 year old with herpes simplex with lesions in mouth, lips, eyelid, arm and penis x 2 weeks.  Unable to eat or drink  Patient has had similar episodes in the past  Received IV Acyclovir 680mg  (10 mg/kg) in ED X 1 @ 23:34 on 07/07/12  IV Acyclovir to continue until patient can tolerate PO  Goal of Therapy:  Eradication of infection  Plan:  Acyclovir 680 mg (10mg /kg) IV q8h Follow plan to change to PO once appropriate.  Rogen Porte, Joselyn Glassman,  PharmD 07/08/2012,3:13 AM

## 2012-07-08 NOTE — ED Notes (Signed)
Patient being admitted to 1339. Report called to Pandora, Charity fundraiser. All questions answered.

## 2012-07-08 NOTE — Care Management Note (Signed)
    Page 1 of 1   07/08/2012     10:59:34 AM   CARE MANAGEMENT NOTE 07/08/2012  Patient:  Paul House, Paul House   Account Number:  [id number]  Date Initiated:  07/08/2012  Documentation initiated by:  Lorenda Ishihara  Subjective/Objective Assessment:   21 yo male admitted with failed OP treatment of herpes outbreak. PTA lived at home with spouse.     Action/Plan:   ID consulted   Anticipated DC Date:  07/10/2012   Anticipated DC Plan:  HOME/SELF CARE      DC Planning Services  CM consult  Medication Assistance  PCP issues      Choice offered to / List presented to:             Status of service:  In process, will continue to follow Medicare Important Message given?   (If response is "NO", the following Medicare IM given date fields will be blank) Date Medicare IM given:   Date Additional Medicare IM given:    Discharge Disposition:    Per UR Regulation:  Reviewed for med. necessity/level of care/duration of stay  If discussed at Long Length of Stay Meetings, dates discussed:    Comments:  07-08-12 Lorenda Ishihara RN CM 1100 Per chart patient does not have PCP currently, having issues with meds due to lack of money, recently lost insurance. Will follow for d/c planning, med assistance once ID confirms duration of treatment and suppressive therapy needed

## 2012-07-08 NOTE — ED Notes (Signed)
Pt c/o pain in stomach at this time. Denies nausea.

## 2012-07-08 NOTE — Progress Notes (Signed)
Pt has blisters in mouth and lips that are bleeding. There is crusting on his lips. He is having a difficult time eating and drinking. He is rating his mouth pain a 10 and states the morephine 2mg  IV doesn't help that much. Also using the lidocaine mouth rinse. Ambulating to the bathroom with standby assist. Wife remains in the room with him. Herpes virus culture obtained .
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
## 2012-07-08 NOTE — Progress Notes (Signed)
INITIAL ADULT NUTRITION ASSESSMENT Date: 07/08/2012   Time: 11:21 AM Reason for Assessment: Nutrition risk  INTERVENTION: Anti-emetics per MD. Hopefully magic mouthwash can help with pt's oral pain symptoms. Assisted pt with ordering lunch. Recommend nursing re-weigh pt. Will monitor.     ASSESSMENT: Male 21 y.o.  Dx: Herpes simplex infection  Food/Nutrition Related Hx: Pt reports being unable to eat for the past 3 days r/t oral herpes infection outbreak and pain in that region. Pt reports before then he was eating small amounts of food. Pt reports drinking water without difficulty but had not ordered clear liquid diet yet. Pt denies any difficulty swallowing. Noted pt's weight on 9/25 was 150 pounds, and weight today was 135 pounds, recommend nursing re-weigh pt to ensure accurate weight.   Hx:  Past Medical History  Diagnosis Date  . Mouth ulcers   . MRSA (methicillin resistant staph aureus) culture positive   . Herpes    Related Meds:  Scheduled Meds:   . sodium chloride   Intravenous Once  . acyclovir  10 mg/kg Intravenous Q8H  . acyclovir  10 mg/kg Intravenous Once  .  HYDROmorphone (DILAUDID) injection  1 mg Intravenous Once  . magic mouthwash  5 mL Oral Once  . methylPREDNISolone (SOLU-MEDROL) injection  125 mg Intravenous Once  . ondansetron  4 mg Intravenous Once  . DISCONTD: acyclovir  680 mg Intravenous Q8H   Continuous Infusions:   . sodium chloride     PRN Meds:.acetaminophen, acetaminophen, lidocaine, morphine injection, ondansetron (ZOFRAN) IV, ondansetron, DISCONTD: haloperidol lactate  Ht: 6' (182.9 cm)  Wt: 135 lb 2.3 oz (61.3 kg)  Ideal Wt: 178 lb % Ideal Wt: 76  Usual Wt: 150 lb per pt report  % Usual Wt: 90  Wt Readings from Last 10 Encounters:  07/08/12 135 lb 2.3 oz (61.3 kg)  07/03/12 150 lb (68.04 kg)  04/17/12 150 lb (68.04 kg)  03/08/12 140 lb (63.504 kg)  03/02/12 155 lb (70.308 kg)    Body mass index is 18.33 kg/(m^2).   Labs:    CMP     Component Value Date/Time   NA 132* 07/08/2012 0515   K 4.7 07/08/2012 0515   CL 95* 07/08/2012 0515   CO2 24 07/08/2012 0515   GLUCOSE 121* 07/08/2012 0515   BUN 21 07/08/2012 0515   CREATININE 1.43* 07/08/2012 0515   CALCIUM 9.3 07/08/2012 0515   PROT 9.1* 07/07/2012 2312   ALBUMIN 4.2 07/07/2012 2312   AST 18 07/07/2012 2312   ALT 10 07/07/2012 2312   ALKPHOS 87 07/07/2012 2312   BILITOT 1.3* 07/07/2012 2312   GFRNONAA 68* 07/08/2012 0515   GFRAA 79* 07/08/2012 0515   No intake or output data in the 24 hours ending 07/08/12 1130  Last BM - 9/27  Diet Order: Clear Liquid   IVF:    sodium chloride    Estimated Nutritional Needs:   Kcal:2100-2450 Protein:90-110g Fluid:2.1-2.4L  NUTRITION DIAGNOSIS: -Inadequate oral intake (NI-2.1).  Status: Ongoing  RELATED TO: mouth pain from oral herpes simplex   AS EVIDENCE BY: pt statement, clear liquid diet  MONITORING/EVALUATION(Goals): Advance diet as tolerated to regular diet.   EDUCATION NEEDS: -No education needs identified at this time   Dietitian #: [phone]  DOCUMENTATION CODES Per approved criteria  -Not applicable     Marshall Cork 07/08/2012, 11:21 AM

## 2012-07-08 NOTE — Progress Notes (Signed)
[facility] PROGRESS NOTE  Paul House ZOX:[id number] DOB: 07-24-1991 DOA: 07/07/2012 PCP: No primary provider on file.   Brief narrative: 21 year old male with history of recurrent herpes simplex infection who was recently seen in the ED for  herpes simplex  involving the lips ,  Right arm,  left eyelid and penis and sent home on a course of acyclovir return to the ED with worsening of symptoms and unable to take medication by mouth.   Assessment/Plan: Principal Problem:  *Herpes simplex infection Has diffuse lesions over his lips, tongue , left  eyelid, right forearm and penis. Patient informs having a recurrent herpetic breakouts for past 10 years and because of insurance issues has not followed with a physician regularly. He also has been having difficulty keeping up jobs because of these breakouts and goes to the ED to get medications. -Patient started on IV acyclovir every 8 hours which will be continued. -Discussed with ID Dr. Orvan Falconer who recommends getting a viral culture to confirm HSV and differentiating it from any of her autoimmune skin lesions. -Continue IV hydration. -Clear liquids as tolerated. Pain control with as needed morphine and oral lidocaine viscous solution -ID consult for duration of treatment and for the suppressive therapy given the recurrent symptoms.  Acute kidney injury Likely in the setting of dehydration. We'll continue with IV hydration and monitor.  Code Status: Full code Family Communication: Wife at bedside Disposition Plan: Home once stable    Consultants:  Infectious disease  Procedures:  None  Antibiotics:  IV acyclovir since 9/29  HPI/Subjective: Has a lot of pain around the mouth and tongue.  Objective: Filed Vitals:   07/07/12 2250 07/08/12 0016 07/08/12 0343 07/08/12 0624  BP: 138/87 129/82 126/77 116/68  Pulse: 93 104 88 91  Temp: 99.5 F (37.5 C) 98.5 F (36.9 C) 97.9 F (36.6 C) 98.2 F (36.8 C)    TempSrc: Oral Oral Oral Oral  Resp: 17 20 20 16   Height:   6' (1.829 m)   Weight:   61.3 kg (135 lb 2.3 oz)   SpO2: 97% 99% 99% 98%   No intake or output data in the 24 hours ending 07/08/12 0933 Filed Weights   07/07/12 1935 07/08/12 0343  Weight: 68.04 kg (150 lb) 61.3 kg (135 lb 2.3 oz)    Exam:   General: young male lying in bed in no acute distress  HEENT: herpetic lesions over upper and lower lips and tongue and left eye lids, has difficulty speaking and protruding his tongue out due to pain. No pallor, no conjunctival congestion, pupils ractive b/l, EOMI   Cardiovascular: NS1&S2, no murmurs  Respiratory:clear b/l  Abdomen: SOFT, nt, nd, bs+  EXT: Warm, 3 small herpetic lesions over left forearm, 3 small lesions over the shaft of penis  CNS: AAOX3  Data Reviewed: Basic Metabolic Panel:  Lab 07/08/12 8119 07/07/12 2312  NA 132* 134*  K 4.7 3.9  CL 95* 94*  CO2 24 23  GLUCOSE 121* 80  BUN 21 15  CREATININE 1.43* 1.02  CALCIUM 9.3 9.7  MG -- --  PHOS -- --   Liver Function Tests:  Lab 07/07/12 2312  AST 18  ALT 10  ALKPHOS 87  BILITOT 1.3*  PROT 9.1*  ALBUMIN 4.2   No results found for this basename: LIPASE:5,AMYLASE:5 in the last 168 hours No results found for this basename: AMMONIA:5 in the last 168 hours CBC:  Lab 07/07/12 2312  WBC 6.8  NEUTROABS --  HGB 17.2*  HCT 47.4  MCV 87.8  PLT 207   Cardiac Enzymes: No results found for this basename: CKTOTAL:5,CKMB:5,CKMBINDEX:5,TROPONINI:5 in the last 168 hours BNP (last 3 results) No results found for this basename: PROBNP:3 in the last 8760 hours CBG: No results found for this basename: GLUCAP:5 in the last 168 hours  No results found for this or any previous visit (from the past 240 hour(s)).   Studies: No results found.  Scheduled Meds:   . sodium chloride   Intravenous Once  . acyclovir  10 mg/kg Intravenous Once  . acyclovir  680 mg Intravenous Q8H  .  HYDROmorphone (DILAUDID)  injection  1 mg Intravenous Once  . magic mouthwash  5 mL Oral Once  . methylPREDNISolone (SOLU-MEDROL) injection  125 mg Intravenous Once  . ondansetron  4 mg Intravenous Once   Continuous Infusions:   . sodium chloride        Time spent: 25 Minutes    Paul House  [facility] Pager [phone] If 8PM-8AM, please contact night-coverage at [email or web address], password [facility] 07/08/2012, 9:33 AM  LOS: 1 day

## 2012-07-08 NOTE — H&P (Signed)
History and Physical  Paul House NWG:[id number] DOB: Dec 21, 1990 DOA: 07/07/2012  Referring physician: Mont Dutton, NP PCP: No primary provider on file.   Chief Complaint: mouth pain  HPI:  21 year old man PMH herpes simplex presented to ED with lesions in mouth, on lips, left eyelid, right arm and penis for 2 weeks. Unable to eat or drink. Was given steroids and IV acyclovir and referred for admission for severe mucocutaneous/disseminated herpes simplex.   Patient has a history of multiple episodes of same in the past and it has been disseminated in the past. Treated with acyclovir in the past. He was seen in ED 5 days ago and given Rx for acyclovir but was unable to take properly secondary to finances. He denies any visual changes in the left eye.  In ED noted to be afebrile with stable vitals. HIV screen negative.  Chart Review:  07/03/2012 ED visit--diagnosed with recurrent oral herpes infection. Discharged from ED with Rx for acyclovir and Percocet.  Review of Systems:  Negative for fever, visual changes, new muscle aches, chest pain, SOB, dysuria, bleeding.  Positive for nausea.  Past Medical History  Diagnosis Date  . Mouth ulcers   . MRSA (methicillin resistant staph aureus) culture positive   . Herpes     History reviewed. No pertinent past surgical history.  Social History:  reports that he has been smoking.  He does not have any smokeless tobacco history on file. He reports that he drinks alcohol. He reports that he does not use illicit drugs.  No Known Allergies  No family history on file.   Prior to Admission medications   Medication Sig Start Date End Date Taking? Authorizing Provider  acyclovir (ZOVIRAX) 400 MG tablet Take 1 tablet (400 mg total) by mouth 3 (three) times daily. 07/03/12  Yes Dione Booze, MD   Physical Exam: Filed Vitals:   07/07/12 1925 07/07/12 1935 07/07/12 2250 07/08/12 0016  BP: 125/79 126/88 138/87 129/82  Pulse: 92 88 93 104    Temp:  99.3 F (37.4 C) 99.5 F (37.5 C) 98.5 F (36.9 C)  TempSrc:  Oral Oral Oral  Resp: 18 18 17 20   Height:  6' (1.829 m)    Weight:  68.04 kg (150 lb)    SpO2: 100% 100% 97% 99%    General:  Examined in ED, appears calm, mildly uncomforable. Eyes: pupils/sclera/corneas appear normal bilaterally; left upper eyelid with crusted vesicle. Irises appear normal. ENT: grossly normal hearing. Dry mucous membranes, severe mucocutaneous involvement of the lips. Neck: no LAD, masses or thyromegaly Cardiovascular: No LE edema. Respiratory: Normal respiratory effort. Abdomen: soft, ntnd Skin: few vesicles seen right arm, crusted GU: vesicle noted on circumcised penis, scrotum appears unremarkable Musculoskeletal: grossly normal tone BUE/BLE Psychiatric: grossly normal mood and affect, speech fluent and appropriate Neurologic: grossly non-focal.   Labs on Admission:  Basic Metabolic Panel:  Lab 07/07/12 5784  NA 134*  K 3.9  CL 94*  CO2 23  GLUCOSE 80  BUN 15  CREATININE 1.02  CALCIUM 9.7  MG --  PHOS --    Liver Function Tests:  Lab 07/07/12 2312  AST 18  ALT 10  ALKPHOS 87  BILITOT 1.3*  PROT 9.1*  ALBUMIN 4.2   CBC:  Lab 07/07/12 2312  WBC 6.8  NEUTROABS --  HGB 17.2*  HCT 47.4  MCV 87.8  PLT 207   Principal Problem:  *Herpes simplex infection   Assessment/Plan 1. Recurrent herpes simplex, disseminated--severe mucocutaneous involvement. Admit for IV  fluids, IV acyclovir until can tolerate PO. No evidence of involvement of eye itself at this time. Monitor. Viscous lidocaine. Per CDC guidelines--contact precautions. 2. Dehydration--IVF. 3. Non-compliance--if discharged on oral therapy will need medication assistance if possible.  Code Status: Full code Family Communication: discussed with wife at bedside Disposition Plan: home when improved  Time spent: 98 minutes  Brendia Sacks, MD  [facility] Team 6 Pager [phone]. If 8PM-8AM, please  contact night-coverage at [email or web address], password [facility] 07/08/2012, 12:49 AM

## 2012-07-09 LAB — URINALYSIS, ROUTINE W REFLEX MICROSCOPIC
Hgb urine dipstick: NEGATIVE
Leukocytes, UA: NEGATIVE
Nitrite: NEGATIVE
Protein, ur: NEGATIVE mg/dL
Urobilinogen, UA: 1 mg/dL (ref 0.0–1.0)

## 2012-07-09 LAB — BASIC METABOLIC PANEL
Calcium: 8.7 mg/dL (ref 8.4–10.5)
GFR calc Af Amer: 80 mL/min — ABNORMAL LOW (ref >90)
GFR calc non Af Amer: 69 mL/min — ABNORMAL LOW (ref >90)
Potassium: 3.7 mEq/L (ref 3.5–5.1)
Sodium: 137 mEq/L (ref 135–145)

## 2012-07-09 MED ORDER — BOOST / RESOURCE BREEZE PO LIQD
1.0000 | Freq: Three times a day (TID) | ORAL | Status: DC
  Administered 2012-07-09: 1 via ORAL

## 2012-07-09 MED ORDER — MORPHINE SULFATE 2 MG/ML IJ SOLN
2.0000 mg | INTRAMUSCULAR | Status: DC | PRN
  Administered 2012-07-09: 2 mg via INTRAVENOUS
  Filled 2012-07-09: qty 1

## 2012-07-09 NOTE — Progress Notes (Signed)
   INFECTIOUS DISEASE PROGRESS NOTE  ID: Paul House is a 21 y.o. male with   Principal Problem:  *Herpes simplex infection Active Problems:  Acute kidney injury  Hyperglycemia  Cigarette smoker  Subjective: No distress, not able to eat  Abtx:  Anti-infectives     Start     Dose/Rate Route Frequency Ordered Stop   07/08/12 1600   acyclovir (ZOVIRAX) 615 mg in dextrose 5 % 100 mL IVPB        10 mg/kg  61.3 kg 112.3 mL/hr over 60 Minutes Intravenous Every 8 hours 07/08/12 1033     07/08/12 0800   acyclovir (ZOVIRAX) 680 mg in dextrose 5 % 100 mL IVPB  Status:  Discontinued        680 mg 113.6 mL/hr over 60 Minutes Intravenous Every 8 hours 07/08/12 0321 07/08/12 1033   07/07/12 2315   acyclovir (ZOVIRAX) 680 mg in dextrose 5 % 100 mL IVPB        10 mg/kg  68 kg 113.6 mL/hr over 60 Minutes Intravenous  Once 07/07/12 2253 07/08/12 0034          Medications:  Scheduled:   . acyclovir  10 mg/kg Intravenous Q8H    Objective: Vital signs in last 24 hours: Temp:  [97.3 F (36.3 C)-97.4 F (36.3 C)] 97.4 F (36.3 C) (10/01 1478) Pulse Rate:  [69-81] 81  (10/01 0633) Resp:  [16] 16  (10/01 0633) BP: (113-137)/(70-85) 113/70 mmHg (10/01 0633) SpO2:  [100 %] 100 % (10/01 2956)   General appearance: alert, cooperative and mild distress Throat: abnormal findings: edema of lips, herpetic lesion and marked swelling of lips, crusting. Resp: clear to auscultation bilaterally Cardio: regular rate and rhythm GI: normal findings: bowel sounds normal and soft, non-tender  Lab Results  Basename 07/09/12 0340 07/08/12 0515 07/07/12 2312  WBC -- -- 6.8  HGB -- -- 17.2*  HCT -- -- 47.4  NA 137 132* --  K 3.7 4.7 --  CL 101 95* --  CO2 25 24 --  BUN 21 21 --  CREATININE 1.42* 1.43* --  GLU -- -- --   Liver Panel  Basename 07/07/12 2312  PROT 9.1*  ALBUMIN 4.2  AST 18  ALT 10  ALKPHOS 87  BILITOT 1.3*  BILIDIR --  IBILI --   Sedimentation Rate No  results found for this basename: ESRSEDRATE in the last 72 hours C-Reactive Protein No results found for this basename: CRP:2 in the last 72 hours  Microbiology: Recent Results (from the past 240 hour(s))  HERPES SIMPLEX VIRUS CULTURE     Status: Normal (Preliminary result)   Collection Time   07/08/12  7:20 PM      Component Value Range Status Comment   Specimen Description MOUTH   Final    Special Requests Normal   Final    Culture Culture has been initiated.   Final    Report Status PENDING   Incomplete     Studies/Results: No results found.   Assessment/Plan: Day 5 ACV  Disseminated HSV Await viral Cx (although notoriously not very sensitive). PCR would be a better test, not sure it would be useful at this point after 4 days of ACV Would check CH50, serum Ig's,  The etiology of his continued recurrences is bewildering.   Paul House Infectious Diseases [phone] 07/09/2012, 1:54 PM   LOS: 2 days

## 2012-07-09 NOTE — ED Provider Notes (Signed)
Medical screening examination/treatment/procedure(s) were performed by non-physician practitioner and as supervising physician I was immediately available for consultation/collaboration.  On my exam the patient was in no distress.  I saw the relevant labs and studies - I agree with the interpretation.  Young M w systemic herpes.  Admit.   Gerhard Munch, MD 07/09/12 [phone]

## 2012-07-09 NOTE — Progress Notes (Signed)
Nutrition Brief Note  Intervention: Resource Breeze TID, pt can add water and mix if pt thinks too sweet, in order to maximize nutrition of liquids he is able to take in. Pt willing to try. Will monitor.   Pt continues with no oral intake other than water r/t mouth pain. RN reports pt has been not able to wrap lips around straw to suck in liquids, however wife thinks he will do better with normal straw rather than tiny straw that comes with [employer] container. If inadequate oral intake continues, recommend MD discuss temporary enteral nutrition if panda tube able to be placed.   Levon Hedger MS, RD, LDN [phone] Pager [phone] After Hours Pager

## 2012-07-09 NOTE — Progress Notes (Signed)
[facility] PROGRESS NOTE  Paul House ZOX:[id number] DOB: 1991/05/28 DOA: 07/07/2012 PCP: No primary provider on file.  Brief narrative:  21 year old male with history of recurrent herpes simplex infection who was recently seen in the ED for herpes simplex involving the lips , Right arm, left eyelid and penis and sent home on a course of acyclovir return to the ED with worsening of symptoms and unable to take medication by mouth.    Assessment/Plan:  Principal Problem:  *Severe Herpes simplex infection  Has diffuse lesions over his lips, tongue , left eyelid, right forearm and penis. Patient informs having a recurrent herpetic breakouts for past 10 years and because of insurance issues has not followed with a physician regularly. He also has been having difficulty keeping up jobs because of these breakouts and goes to the ED to get medications.  -Patient started on IV acyclovir every 8 hours which will be continued. (Day 2) -Discussed with ID Dr. Orvan Falconer who recommends getting a viral culture to confirm HSV and differentiating it from any of her autoimmune skin lesions. Culture sent out and pending. Further recommendation for duration of treatment and long-term suppression therapy per ID. -Continue IV hydration.  -Clear liquids as tolerated.  Pain control with as needed morphine and oral lidocaine viscous solution . Increase morphine to every 2 hours.  Acute kidney injury  Likely in the setting of dehydration. Will check UA and urine sodium. continue with IV hydration.  Code Status: Full code  Family Communication: Wife at bedside  Disposition Plan: Home once stable        Consultants:  Cliffton Asters (ID)  Procedures:  None  Antibiotics:  On IV acyclovir since 9/30  HPI/Subjective: Complains of worsening pain over his lips.  Objective: Filed Vitals:   07/08/12 0624 07/08/12 1400 07/08/12 2238 07/09/12 0633  BP: 116/68 137/85 117/76 113/70  Pulse: 91  69 76 81  Temp: 98.2 F (36.8 C) 97.4 F (36.3 C) 97.3 F (36.3 C) 97.4 F (36.3 C)  TempSrc: Oral Axillary Oral Oral  Resp: 16 16 16 16   Height:      Weight:      SpO2: 98% 100% 100% 100%    Intake/Output Summary (Last 24 hours) at 07/09/12 1324 Last data filed at 07/09/12 0700  Gross per 24 hour  Intake 1459.97 ml  Output      0 ml  Net 1459.97 ml   Filed Weights   07/07/12 1935 07/08/12 0343  Weight: 68.04 kg (150 lb) 61.3 kg (135 lb 2.3 oz)    Exam:  General: young male lying in bed in no acute distress  HEENT: History all herpetic lesions over upper and lower lips and tongue and left eye lids, some of the blisters over his lips are bleeding.  has difficulty speaking and protruding his tongue out due to pain. No pallor, no conjunctival congestion, pupils reactive b/l, EOMI  Cardiovascular: NS1&S2, no murmurs  Respiratory:clear b/l  Abdomen: SOFT, nt, nd, bs+  EXT: Warm, 3 small herpetic lesions over left forearm, 3 small lesions over the shaft of penis  CNS: AAOX3   Data Reviewed: Basic Metabolic Panel:  Lab 07/09/12 8119 07/08/12 0515 07/07/12 2312  NA 137 132* 134*  K 3.7 4.7 3.9  CL 101 95* 94*  CO2 25 24 23   GLUCOSE 103* 121* 80  BUN 21 21 15   CREATININE 1.42* 1.43* 1.02  CALCIUM 8.7 9.3 9.7  MG -- -- --  PHOS -- -- --   Liver  Function Tests:  Lab 07/07/12 2312  AST 18  ALT 10  ALKPHOS 87  BILITOT 1.3*  PROT 9.1*  ALBUMIN 4.2   No results found for this basename: LIPASE:5,AMYLASE:5 in the last 168 hours No results found for this basename: AMMONIA:5 in the last 168 hours CBC:  Lab 07/07/12 2312  WBC 6.8  NEUTROABS --  HGB 17.2*  HCT 47.4  MCV 87.8  PLT 207   Cardiac Enzymes: No results found for this basename: CKTOTAL:5,CKMB:5,CKMBINDEX:5,TROPONINI:5 in the last 168 hours BNP (last 3 results) No results found for this basename: PROBNP:3 in the last 8760 hours CBG: No results found for this basename: GLUCAP:5 in the last 168  hours  No results found for this or any previous visit (from the past 240 hour(s)).   Studies: No results found.  Scheduled Meds:   . acyclovir  10 mg/kg Intravenous Q8H   Continuous Infusions:   . sodium chloride 125 mL/hr at 07/09/12 0530       Time spent: 25 minutes    Miki Blank  [facility] Pager [phone]. If 8PM-8AM, please contact night-coverage at [email or web address], password [facility] 07/09/2012, 1:24 PM  LOS: 2 days

## 2012-07-10 DIAGNOSIS — B009 Herpesviral infection, unspecified: Secondary | ICD-10-CM

## 2012-07-10 LAB — BASIC METABOLIC PANEL
CO2: 25 mEq/L (ref 19–32)
Chloride: 104 mEq/L (ref 96–112)
Creatinine, Ser: 1.15 mg/dL (ref 0.50–1.35)
Glucose, Bld: 96 mg/dL (ref 70–99)

## 2012-07-10 LAB — SODIUM, URINE, RANDOM: Sodium, Ur: 58 mEq/L

## 2012-07-10 LAB — IGG, IGA, IGM: IgM, Serum: 102 mg/dL (ref 41–251)

## 2012-07-10 NOTE — Progress Notes (Signed)
[facility] PROGRESS NOTE  Paul House UJW:[id number] DOB: 10-08-1991 DOA: 07/07/2012 PCP: No primary provider on file.  Brief narrative:  21 year old male with history of recurrent herpes simplex infection who was recently seen in the ED for herpes simplex involving the lips , Right arm, left eyelid and penis and sent home on a course of acyclovir return to the ED with worsening of symptoms and unable to take medication by mouth.    Assessment/Plan:  Principal Problem:  *Severe Herpes simplex infection  Has diffuse lesions over his lips, tongue , left eyelid, right forearm and penis. Patient informs having a recurrent herpetic breakouts for past 10 years and because of insurance issues has not followed with a physician regularly. He also has been having difficulty keeping up jobs because of these breakouts and goes to the ED to get medications.  -Patient started on IV acyclovir every 8 hours which will be continued.  -Discussed with ID Dr. Orvan Falconer who recommends getting a viral culture to confirm HSV and differentiating it from any of her autoimmune skin lesions. Culture sent out and pending. Further recommendation for duration of treatment and long-term suppression therapy per ID. -Continue IV hydration.  -Clear liquids as tolerated.  Pain control with as needed morphine and oral lidocaine viscous solution . Increase morphine to every 2 hours.  Acute kidney injury : RESOLVED. Likely in the setting of dehydration.  continue with IV hydration.  Code Status: Full code  Family Communication: Wife at bedside  Disposition Plan: Home once stable        Consultants:  Cliffton Asters (ID)  Procedures:  None  Antibiotics:  On IV acyclovir since 9/30  HPI/Subjective: Complains of worsening pain over his lips.  Objective: Filed Vitals:   07/08/12 2238 07/09/12 0633 07/09/12 1439 07/09/12 2230  BP: 117/76 113/70 121/77 122/64  Pulse: 76 81 68 85  Temp: 97.3 F  (36.3 C) 97.4 F (36.3 C) 97.6 F (36.4 C) 98.2 F (36.8 C)  TempSrc: Oral Oral  Oral  Resp: 16 16 20 18   Height:      Weight:      SpO2: 100% 100% 100% 100%    Intake/Output Summary (Last 24 hours) at 07/10/12 1246 Last data filed at 07/10/12 0700  Gross per 24 hour  Intake 3674.13 ml  Output      0 ml  Net 3674.13 ml   Filed Weights   07/07/12 1935 07/08/12 0343  Weight: 68.04 kg (150 lb) 61.3 kg (135 lb 2.3 oz)    Exam:  General: young male lying in bed in no acute distress  HEENT: History all herpetic lesions over upper and lower lips and tongue and left eye lids, some of the blisters over his lips are bleeding.  has difficulty speaking and protruding his tongue out due to pain. No pallor, no conjunctival congestion, pupils reactive b/l, EOMI  Cardiovascular: NS1&S2, no murmurs  Respiratory:clear b/l  Abdomen: SOFT, nt, nd, bs+  EXT: Warm, 3 small herpetic lesions over left forearm, 3 small lesions over the shaft of penis  CNS: AAOX3   Data Reviewed: Basic Metabolic Panel:  Lab 07/10/12 5621 07/09/12 0340 07/08/12 0515 07/07/12 2312  NA 137 137 132* 134*  K 4.0 3.7 4.7 3.9  CL 104 101 95* 94*  CO2 25 25 24 23   GLUCOSE 96 103* 121* 80  BUN 18 21 21 15   CREATININE 1.15 1.42* 1.43* 1.02  CALCIUM 8.7 8.7 9.3 9.7  MG -- -- -- --  PHOS -- -- -- --  Liver Function Tests:  Lab 07/07/12 2312  AST 18  ALT 10  ALKPHOS 87  BILITOT 1.3*  PROT 9.1*  ALBUMIN 4.2   No results found for this basename: LIPASE:5,AMYLASE:5 in the last 168 hours No results found for this basename: AMMONIA:5 in the last 168 hours CBC:  Lab 07/07/12 2312  WBC 6.8  NEUTROABS --  HGB 17.2*  HCT 47.4  MCV 87.8  PLT 207   Cardiac Enzymes: No results found for this basename: CKTOTAL:5,CKMB:5,CKMBINDEX:5,TROPONINI:5 in the last 168 hours BNP (last 3 results) No results found for this basename: PROBNP:3 in the last 8760 hours CBG: No results found for this basename: GLUCAP:5 in the  last 168 hours  Recent Results (from the past 240 hour(s))  HERPES SIMPLEX VIRUS CULTURE     Status: Normal (Preliminary result)   Collection Time   07/08/12  7:20 PM      Component Value Range Status Comment   Specimen Description MOUTH   Final    Special Requests Normal   Final    Culture Culture has been initiated.   Final    Report Status PENDING   Incomplete      Studies: No results found.  Scheduled Meds:    . acyclovir  10 mg/kg Intravenous Q8H  . feeding supplement  1 Container Oral TID BM   Continuous Infusions:    . sodium chloride 1,000 mL (07/10/12 1051)       Time spent: 25 minutes    [facility]  [facility] Pager [phone]. If 8PM-8AM, please contact night-coverage at [email or web address], password [facility] 07/10/2012, 12:46 PM  LOS: 3 days

## 2012-07-10 NOTE — Progress Notes (Addendum)
ANTIBIOTIC CONSULT NOTE - FOLLOW-UP  Pharmacy Consult for IV Acyclovir Indication: Disseminated Herpes Simplex  No Known Allergies  Patient Measurements: Height: 6' (182.9 cm) Weight: 135 lb 2.3 oz (61.3 kg) IBW/kg (Calculated) : 77.6    Vital Signs:   Intake/Output from previous day: 10/01 0701 - 10/02 0700 In: 3674.1 [P.O.:720; I.V.:2728.8; IV Piggyback:225.3] Out: -  Intake/Output from this shift:    Labs:  Basename 07/10/12 0446 07/09/12 0340 07/08/12 0515 07/07/12 2312  WBC -- -- -- 6.8  HGB -- -- -- 17.2*  PLT -- -- -- 207  LABCREA -- -- -- --  CREATININE 1.15 1.42* 1.43* --   Estimated Creatinine Clearance: 86.6 ml/min (by C-G formula based on Cr of 1.15). No results found for this basename: VANCOTROUGH:2,VANCOPEAK:2,VANCORANDOM:2,GENTTROUGH:2,GENTPEAK:2,GENTRANDOM:2,TOBRATROUGH:2,TOBRAPEAK:2,TOBRARND:2,AMIKACINPEAK:2,AMIKACINTROU:2,AMIKACIN:2, in the last 72 hours   Microbiology: Recent Results (from the past 720 hour(s))  HERPES SIMPLEX VIRUS CULTURE     Status: Normal (Preliminary result)   Collection Time   07/08/12  7:20 PM      Component Value Range Status Comment   Specimen Description MOUTH   Final    Special Requests Normal   Final    Culture Culture has been initiated.   Final    Report Status PENDING   Incomplete     Medical History: Past Medical History  Diagnosis Date  . Mouth ulcers   . MRSA (methicillin resistant staph aureus) culture positive   . Herpes     Medications:  Scheduled:     . acyclovir  10 mg/kg Intravenous Q8H  . feeding supplement  1 Container Oral TID BM   Infusions:     . sodium chloride 1,000 mL (07/10/12 1051)   Assessment:  21 year old with herpes simplex with lesions in mouth, lips, eyelid, arm and penis x 2 weeks.  Unable to eat or drink  Patient has had similar episodes in the past  Received IV Acyclovir 680mg  (10 mg/kg) in ED X 1 @ 23:34 on 07/07/12  IV Acyclovir to continue until patient can  tolerate PO  ID following  D4 acyclovir 615 IV every 8 hours  HSV culture pending  Goal of Therapy:  Eradication of infection  Plan:  Continue Acyclovir 615 mg (10mg /kg) IV q8h Will follow renal function Follow plan to change to PO once appropriate.  Renisha Cockrum, Lorra Hals, PharmD 07/10/2012,11:34 AM

## 2012-07-10 NOTE — Progress Notes (Signed)
INFECTIOUS DISEASE PROGRESS NOTE  ID: Paul House is a 21 y.o. male with   Principal Problem:  *Herpes simplex infection Active Problems:  Acute kidney injury  Hyperglycemia  Cigarette smoker  Subjective: Trying to start eating. Thinks swelling may be better.  Abtx:  Anti-infectives     Start     Dose/Rate Route Frequency Ordered Stop   07/08/12 1600   acyclovir (ZOVIRAX) 615 mg in dextrose 5 % 100 mL IVPB        10 mg/kg  61.3 kg 112.3 mL/hr over 60 Minutes Intravenous Every 8 hours 07/08/12 1033     07/08/12 0800   acyclovir (ZOVIRAX) 680 mg in dextrose 5 % 100 mL IVPB  Status:  Discontinued        680 mg 113.6 mL/hr over 60 Minutes Intravenous Every 8 hours 07/08/12 0321 07/08/12 1033   07/07/12 2315   acyclovir (ZOVIRAX) 680 mg in dextrose 5 % 100 mL IVPB        10 mg/kg  68 kg 113.6 mL/hr over 60 Minutes Intravenous  Once 07/07/12 2253 07/08/12 0034          Medications:  Scheduled:   . acyclovir  10 mg/kg Intravenous Q8H  . feeding supplement  1 Container Oral TID BM    Objective: Vital signs in last 24 hours: Temp:  [98.2 F (36.8 C)-98.6 F (37 C)] 98.6 F (37 C) (10/02 1442) Pulse Rate:  [64-85] 64  (10/02 1442) Resp:  [16-18] 16  (10/02 1442) BP: (122-124)/(64-81) 124/81 mmHg (10/02 1442) SpO2:  [100 %] 100 % (10/02 1442)   General appearance: alert, cooperative and no distress Throat: abnormal findings: bloody ulcerations of lips. Resp: clear to auscultation bilaterally Cardio: regular rate and rhythm GI: normal findings: bowel sounds normal and soft, non-tender  Lab Results  Basename 07/10/12 0446 07/09/12 0340 07/07/12 2312  WBC -- -- 6.8  HGB -- -- 17.2*  HCT -- -- 47.4  NA 137 137 --  K 4.0 3.7 --  CL 104 101 --  CO2 25 25 --  BUN 18 21 --  CREATININE 1.15 1.42* --  GLU -- -- --   Liver Panel  Basename 07/07/12 2312  PROT 9.1*  ALBUMIN 4.2  AST 18  ALT 10  ALKPHOS 87  BILITOT 1.3*  BILIDIR --  IBILI --     Sedimentation Rate No results found for this basename: ESRSEDRATE in the last 72 hours C-Reactive Protein No results found for this basename: CRP:2 in the last 72 hours  Microbiology: Recent Results (from the past 240 hour(s))  HERPES SIMPLEX VIRUS CULTURE     Status: Normal (Preliminary result)   Collection Time   07/08/12  7:20 PM      Component Value Range Status Comment   Specimen Description MOUTH   Final    Special Requests Normal   Final    Culture     Final    Value: This specimen is toxic for cell cultures. The specimen will be diluted and the culture repeated.   Report Status PENDING   Incomplete     Studies/Results: No results found.   Assessment/Plan:  Day 6 ACV  Disseminated HSV  Await viral Cx (although notoriously not very sensitive). PCR would be a better test, not sure it would be useful at this point after 5 days of ACV  Would check CH50,  Serum Ig's not decreased. HIV (-)  Key may be getting good follow up, drug assistance program to  make sure he can stay on suppresive ACV or valtrex   [name] Infect[facility] Diseases [phone] 07/10/2012, 3:50 PM   LOS: 3 days

## 2012-07-11 NOTE — Progress Notes (Signed)
Nutrition Brief Note  - Met with pt who reports mouth pain improving. Pt reports eating better and pt has been consuming 50-75% of meals on regular diet which consisted of soft solid foods like eggs and grits. Pt does not like [employer] - will d/c. Will continue to monitor intake.   Levon Hedger MS, RD, LDN [phone] Pager [phone] After Hours Pager

## 2012-07-11 NOTE — Progress Notes (Signed)
   INFECTIOUS DISEASE PROGRESS NOTE  ID: Paul House is a 21 y.o. male with  Principal Problem:  *Herpes simplex infection Active Problems:  Acute kidney injury  Hyperglycemia  Cigarette smoker  Subjective: Without complaints  Abtx:  Anti-infectives     Start     Dose/Rate Route Frequency Ordered Stop   07/08/12 1600   acyclovir (ZOVIRAX) 615 mg in dextrose 5 % 100 mL IVPB        10 mg/kg  61.3 kg 112.3 mL/hr over 60 Minutes Intravenous Every 8 hours 07/08/12 1033     07/08/12 0800   acyclovir (ZOVIRAX) 680 mg in dextrose 5 % 100 mL IVPB  Status:  Discontinued        680 mg 113.6 mL/hr over 60 Minutes Intravenous Every 8 hours 07/08/12 0321 07/08/12 1033   07/07/12 2315   acyclovir (ZOVIRAX) 680 mg in dextrose 5 % 100 mL IVPB        10 mg/kg  68 kg 113.6 mL/hr over 60 Minutes Intravenous  Once 07/07/12 2253 07/08/12 0034          Medications:  Scheduled:   . acyclovir  10 mg/kg Intravenous Q8H  . feeding supplement  1 Container Oral TID BM    Objective: Vital signs in last 24 hours: Temp:  [97.4 F (36.3 C)-98.4 F (36.9 C)] 98.4 F (36.9 C) (10/03 1349) Pulse Rate:  [56-63] 56  (10/03 1349) Resp:  [16-18] 16  (10/03 1349) BP: (107-126)/(73-76) 118/75 mmHg (10/03 1349) SpO2:  [99 %-100 %] 99 % (10/03 1349)   General appearance: alert, cooperative and no distress Throat: decreased erythema, scabbing. able to open more.   Lab Results  Basename 07/10/12 0446 07/09/12 0340  WBC -- --  HGB -- --  HCT -- --  NA 137 137  K 4.0 3.7  CL 104 101  CO2 25 25  BUN 18 21  CREATININE 1.15 1.42*  GLU -- --   Liver Panel No results found for this basename: PROT:2,ALBUMIN:2,AST:2,ALT:2,ALKPHOS:2,BILITOT:2,BILIDIR:2,IBILI:2 in the last 72 hours Sedimentation Rate No results found for this basename: ESRSEDRATE in the last 72 hours C-Reactive Protein No results found for this basename: CRP:2 in the last 72 hours  Microbiology: Recent Results (from  the past 240 hour(s))  HERPES SIMPLEX VIRUS CULTURE     Status: Normal (Preliminary result)   Collection Time   07/08/12  7:20 PM      Component Value Range Status Comment   Specimen Description MOUTH   Final    Special Requests Normal   Final    Culture CULTURE IN PROGRESS   Final    Report Status PENDING   Incomplete     Studies/Results: No results found.   Assessment/Plan: Day 7 ACV  Disseminated HSV  Await viral Cx (although notoriously not very sensitive). PCR would be a better test... Would check CH50,  Serum Ig's not decreased. HIV (-)  Key may be getting good follow up, drug assistance program to make sure he can stay on suppresive ACV or valtrex  He is trying to eat more.     [name] Infectious Diseases [phone] 07/11/2012, 3:11 PM   LOS: 4 days

## 2012-07-11 NOTE — Progress Notes (Signed)
[facility] PROGRESS NOTE  Durand Wittmeyer AVW:[id number] DOB: 21-Dec-1990 DOA: 07/07/2012 PCP: No primary provider on file.  Brief narrative:  21 year old male with history of recurrent herpes simplex infection who was recently seen in the ED for herpes simplex involving the lips , Right arm, left eyelid and penis and sent home on a course of acyclovir return to the ED with worsening of symptoms and unable to take medication by mouth.    Assessment/Plan:  Principal Problem:  *Severe Herpes simplex infection  Has diffuse lesions over his lips, tongue , left eyelid, right forearm and penis. Patient informs having a recurrent herpetic breakouts for past 10 years and because of insurance issues has not followed with a physician regularly. He also has been having difficulty keeping up jobs because of these breakouts and goes to the ED to get medications.  -Patient started on IV acyclovir every 8 hours which will be continued.  -Discussed with ID Dr. Orvan Falconer who recommends getting a viral culture to confirm HSV and differentiating it from any of her autoimmune skin lesions. Culture sent out and pending. Further recommendation for duration of treatment and long-term suppression therapy per ID. -Continue IV hydration.  -Clear liquids as tolerated.  Pain control with as needed morphine and oral lidocaine viscous solution . Increase morphine to every 2 hours.  Acute kidney injury : RESOLVED. Likely in the setting of dehydration.  continue with IV hydration.  Code Status: Full code  Family Communication: Wife at bedside  Disposition Plan: Home once stable        Consultants:  Cliffton Asters (ID)  Procedures:  None  Antibiotics:  On IV acyclovir since 9/30  HPI/Subjective: Complains of worsening pain over his lips.  Objective: Filed Vitals:   07/10/12 1442 07/10/12 2200 07/11/12 0501 07/11/12 1349  BP: 124/81 126/76 107/73 118/75  Pulse: 64 63 62 56  Temp: 98.6 F  (37 C) 97.5 F (36.4 C) 97.4 F (36.3 C) 98.4 F (36.9 C)  TempSrc:  Axillary Axillary   Resp: 16 16 18 16   Height:      Weight:      SpO2: 100% 100% 100% 99%    Intake/Output Summary (Last 24 hours) at 07/11/12 1735 Last data filed at 07/11/12 1121  Gross per 24 hour  Intake    240 ml  Output   1150 ml  Net   -910 ml   Filed Weights   07/07/12 1935 07/08/12 0343  Weight: 68.04 kg (150 lb) 61.3 kg (135 lb 2.3 oz)    Exam:  General: young male lying in bed in no acute distress  HEENT: History all herpetic lesions over upper and lower lips and tongue and left eye lids, some of the blisters over his lips are bleeding.  has difficulty speaking and protruding his tongue out due to pain. No pallor, no conjunctival congestion, pupils reactive b/l, EOMI  Cardiovascular: NS1&S2, no murmurs  Respiratory:clear b/l  Abdomen: SOFT, nt, nd, bs+  EXT: Warm, 3 small herpetic lesions over left forearm, 3 small lesions over the shaft of penis  CNS: AAOX3   Data Reviewed: Basic Metabolic Panel:  Lab 07/10/12 8295 07/09/12 0340 07/08/12 0515 07/07/12 2312  NA 137 137 132* 134*  K 4.0 3.7 4.7 3.9  CL 104 101 95* 94*  CO2 25 25 24 23   GLUCOSE 96 103* 121* 80  BUN 18 21 21 15   CREATININE 1.15 1.42* 1.43* 1.02  CALCIUM 8.7 8.7 9.3 9.7  MG -- -- -- --  PHOS -- -- -- --   Liver Function Tests:  Lab 07/07/12 2312  AST 18  ALT 10  ALKPHOS 87  BILITOT 1.3*  PROT 9.1*  ALBUMIN 4.2   No results found for this basename: LIPASE:5,AMYLASE:5 in the last 168 hours No results found for this basename: AMMONIA:5 in the last 168 hours CBC:  Lab 07/07/12 2312  WBC 6.8  NEUTROABS --  HGB 17.2*  HCT 47.4  MCV 87.8  PLT 207   Cardiac Enzymes: No results found for this basename: CKTOTAL:5,CKMB:5,CKMBINDEX:5,TROPONINI:5 in the last 168 hours BNP (last 3 results) No results found for this basename: PROBNP:3 in the last 8760 hours CBG: No results found for this basename: GLUCAP:5 in the  last 168 hours  Recent Results (from the past 240 hour(s))  HERPES SIMPLEX VIRUS CULTURE     Status: Normal (Preliminary result)   Collection Time   07/08/12  7:20 PM      Component Value Range Status Comment   Specimen Description MOUTH   Final    Special Requests Normal   Final    Culture CULTURE IN PROGRESS   Final    Report Status PENDING   Incomplete      Studies: No results found.  Scheduled Meds:    . acyclovir  10 mg/kg Intravenous Q8H  . DISCONTD: feeding supplement  1 Container Oral TID BM   Continuous Infusions:    . sodium chloride 125 mL/hr at 07/11/12 0534       Time spent: 25 minutes    [facility]  [facility] Pager [phone]. If 8PM-8AM, please contact night-coverage at [email or web address], password [facility] 07/11/2012, 5:35 PM  LOS: 4 days

## 2012-07-12 LAB — BASIC METABOLIC PANEL
CO2: 25 mEq/L (ref 19–32)
Calcium: 8.6 mg/dL (ref 8.4–10.5)
Glucose, Bld: 110 mg/dL — ABNORMAL HIGH (ref 70–99)
Sodium: 141 mEq/L (ref 135–145)

## 2012-07-12 LAB — HERPES SIMPLEX VIRUS CULTURE
Culture: NOT DETECTED
Special Requests: NORMAL

## 2012-07-12 MED ORDER — VALACYCLOVIR HCL 500 MG PO TABS: 500.0000 mg | ORAL_TABLET | Freq: Two times a day (BID) | ORAL | Status: AC

## 2012-07-12 MED ORDER — POTASSIUM CHLORIDE 20 MEQ/15ML (10%) PO LIQD
40.0000 meq | Freq: Once | ORAL | Status: AC
  Administered 2012-07-12: 40 meq via ORAL
  Filled 2012-07-12: qty 30

## 2012-07-12 MED ORDER — VALACYCLOVIR HCL 500 MG PO TABS: 500.0000 mg | ORAL_TABLET | Freq: Every day | ORAL | Status: DC

## 2012-07-12 MED ORDER — LIDOCAINE VISCOUS 2 % MT SOLN: 20.0000 mL | OROMUCOSAL | Status: DC | PRN

## 2012-07-12 NOTE — Discharge Summary (Signed)
Physician Discharge Summary  Paul House JXB:[id number] DOB: 1991/07/27 DOA: 07/07/2012  PCP: No primary provider on file.  Admit date: 07/07/2012 Discharge date: 07/12/2012  Recommendations for Outpatient Follow-up:  1. Follow up with ID in 1 to 2 weeks.  Discharge Diagnoses:  Principal Problem:  *Herpes simplex infection Active Problems:  Acute kidney injury  Hyperglycemia  Cigarette smoker   Discharge Condition: stable  Diet recommendation: regular  Filed Weights   07/07/12 1935 07/08/12 0343  Weight: 68.04 kg (150 lb) 61.3 kg (135 lb 2.3 oz)    History of present illness:  21 year old male with history of recurrent herpes simplex infection who was recently seen in the ED for herpes simplex involving the lips , Right arm, left eyelid and penis and sent home on a course of acyclovir return to the ED with worsening of symptoms and unable to take medication by mouth.   Hospital Course:   Severe Herpes simplex infection  Has diffuse lesions over his lips, tongue , left eyelid, right forearm and penis. Patient informs having a recurrent herpetic breakouts for past 10 years and because of insurance issues has not followed with a physician regularly. He also has been having difficulty keeping up jobs because of these breakouts and goes to the ED to get medications.  -Patient started on IV acyclovir every 8 hours whichWAS TRANSITIONED TO oral valtrex today. He will be discharged home with 6 more days of valtrex after which he will be on a daily dose. Recommend following up with ID clinic in 1 to 2 weeks.   Pain control with as needed morphine and oral lidocaine viscous solution .   Acute kidney injury : RESOLVED.  Likely in the setting of dehydration. continue with IV hydration.   Consultations:  ID  Discharge Exam: Filed Vitals:   07/11/12 0501 07/11/12 1349 07/11/12 2152 07/12/12 0635  BP: 107/73 118/75 136/85 113/79  Pulse: 62 56 69 59  Temp: 97.4 F (36.3 C)  98.4 F (36.9 C) 97.7 F (36.5 C) 97.5 F (36.4 C)  TempSrc: Axillary  Oral Oral  Resp: 18 16 18 20   Height:      Weight:      SpO2: 100% 99% 100% 100%    General: young male lying in bed in no acute distress  HEENT: History all herpetic lesions over upper and lower lips and tongue and left eye lids, some of the blisters over his lips are bleeding. has difficulty speaking and protruding his tongue out due to pain. No pallor, no conjunctival congestion, pupils reactive b/l, Cardiovascular: NS1&S2, no murmurs  Respiratory:clear b/l  Abdomen: SOFT, nt, nd, bs+  EXT: Warm, 3 small herpetic lesions over left forearm, 3 small lesions over the shaft of penis CNS: AAOX3   Discharge Instructions  Discharge Orders    Future Orders Please Complete By Expires   Diet general      Discharge instructions      Comments:   Follow up with ID in 2 weeks before the anti viral medications are completed.   Activity as tolerated - No restrictions          Medication List     As of 07/12/2012 11:51 AM    STOP taking these medications         acyclovir 400 MG tablet   Commonly known as: ZOVIRAX      TAKE these medications         lidocaine 2 % solution   Commonly known as: XYLOCAINE  Take 20 mLs by mouth every 3 (three) hours as needed.      valACYclovir 500 MG tablet   Commonly known as: VALTREX   Take 1 tablet (500 mg total) by mouth daily.      valACYclovir 500 MG tablet   Commonly known as: VALTREX   Take 1 tablet (500 mg total) by mouth 2 (two) times daily.   Start taking on: 07/13/2012          The results of significant diagnostics from this hospitalization (including imaging, microbiology, ancillary and laboratory) are listed below for reference.    Significant Diagnostic Studies: No results found.  Microbiology: Recent Results (from the past 240 hour(s))  HERPES SIMPLEX VIRUS CULTURE     Status: Normal (Preliminary result)   Collection Time   07/08/12  7:20 PM       Component Value Range Status Comment   Specimen Description MOUTH   Final    Special Requests Normal   Final    Culture CULTURE IN PROGRESS   Final    Report Status PENDING   Incomplete      Labs: Basic Metabolic Panel:  Lab 07/12/12 4098 07/10/12 0446 07/09/12 0340 07/08/12 0515 07/07/12 2312  NA 141 137 137 132* 134*  K 3.4* 4.0 3.7 4.7 3.9  CL 107 104 101 95* 94*  CO2 25 25 25 24 23   GLUCOSE 110* 96 103* 121* 80  BUN 10 18 21 21 15   CREATININE 0.98 1.15 1.42* 1.43* 1.02  CALCIUM 8.6 8.7 8.7 9.3 9.7  MG -- -- -- -- --  PHOS -- -- -- -- --   Liver Function Tests:  Lab 07/07/12 2312  AST 18  ALT 10  ALKPHOS 87  BILITOT 1.3*  PROT 9.1*  ALBUMIN 4.2   No results found for this basename: LIPASE:5,AMYLASE:5 in the last 168 hours No results found for this basename: AMMONIA:5 in the last 168 hours CBC:  Lab 07/07/12 2312  WBC 6.8  NEUTROABS --  HGB 17.2*  HCT 47.4  MCV 87.8  PLT 207   Cardiac Enzymes: No results found for this basename: CKTOTAL:5,CKMB:5,CKMBINDEX:5,TROPONINI:5 in the last 168 hours BNP: BNP (last 3 results) No results found for this basename: PROBNP:3 in the last 8760 hours CBG: No results found for this basename: GLUCAP:5 in the last 168 hours    Signed:  Nailah Luepke  [facility] 07/12/2012, 11:51 AM

## 2012-07-12 NOTE — Progress Notes (Signed)
CARE MANAGEMENT NOTE 07/12/2012  Patient:  Paul House, Paul House   Account Number:  [id number]  Date Initiated:  07/08/2012  Documentation initiated by:  Lorenda Ishihara  Subjective/Objective Assessment:   21 yo male admitted with failed OP treatment of herpes outbreak. PTA lived at home with spouse.     Action/Plan:   ID consulted   Anticipated DC Date:  07/10/2012   Anticipated DC Plan:  HOME/SELF CARE      DC Planning Services  CM consult  Medication Assistance  PCP issues      Choice offered to / List presented to:             Status of service:  In process, will continue to follow Medicare Important Message given?   (If response is "NO", the following Medicare IM given date fields will be blank) Date Medicare IM given:   Date Additional Medicare IM given:    Discharge Disposition:    Per UR Regulation:  Reviewed for med. necessity/level of care/duration of stay  If discussed at Long Length of Stay Meetings, dates discussed:    Comments:  07/12/12 MPearson, RN, BSN Generic brans for Valtrex, Zovirax Acyclovir 200mg  cap, can be purchased for $4.00 at [employer], [employer] and [employer]. Pt is aware and states he can afford to purchase for $4.00.   07-08-12 Lorenda Ishihara RN CM 1100 Per chart patient does not have PCP currently, having issues with meds due to lack of money, recently lost insurance. Will follow for d/c planning, med assistance once ID confirms duration of treatment and suppressive therapy needed

## 2012-07-12 NOTE — Progress Notes (Signed)
   INFECTIOUS DISEASE PROGRESS NOTE  ID: Paul House is a 21 y.o. male with   Principal Problem:  *Herpes simplex infection Active Problems:  Acute kidney injury  Hyperglycemia  Cigarette smoker  Subjective: Has been taking po, not clear if solids (from pt)  Abtx:  Anti-infectives     Start     Dose/Rate Route Frequency Ordered Stop   07/08/12 1600   acyclovir (ZOVIRAX) 615 mg in dextrose 5 % 100 mL IVPB        10 mg/kg  61.3 kg 112.3 mL/hr over 60 Minutes Intravenous Every 8 hours 07/08/12 1033     07/08/12 0800   acyclovir (ZOVIRAX) 680 mg in dextrose 5 % 100 mL IVPB  Status:  Discontinued        680 mg 113.6 mL/hr over 60 Minutes Intravenous Every 8 hours 07/08/12 0321 07/08/12 1033   07/07/12 2315   acyclovir (ZOVIRAX) 680 mg in dextrose 5 % 100 mL IVPB        10 mg/kg  68 kg 113.6 mL/hr over 60 Minutes Intravenous  Once 07/07/12 2253 07/08/12 0034          Medications:  Scheduled:   . acyclovir  10 mg/kg Intravenous Q8H  . DISCONTD: feeding supplement  1 Container Oral TID BM    Objective: Vital signs in last 24 hours: Temp:  [97.5 F (36.4 C)-98.4 F (36.9 C)] 97.5 F (36.4 C) (10/04 0635) Pulse Rate:  [56-69] 59  (10/04 0635) Resp:  [16-20] 20  (10/04 0635) BP: (113-136)/(75-85) 113/79 mmHg (10/04 0635) SpO2:  [99 %-100 %] 100 % (10/04 0635)   General appearance: alert, cooperative and no distress Throat: abnormal findings: multiple sores on his lips. he has decreased bleeding/wound on his lips.   Lab Results  Basename 07/12/12 0323 07/10/12 0446  WBC -- --  HGB -- --  HCT -- --  NA 141 137  K 3.4* 4.0  CL 107 104  CO2 25 25  BUN 10 18  CREATININE 0.98 1.15  GLU -- --   Liver Panel No results found for this basename: PROT:2,ALBUMIN:2,AST:2,ALT:2,ALKPHOS:2,BILITOT:2,BILIDIR:2,IBILI:2 in the last 72 hours Sedimentation Rate No results found for this basename: ESRSEDRATE in the last 72 hours C-Reactive Protein No results  found for this basename: CRP:2 in the last 72 hours  Microbiology: Recent Results (from the past 240 hour(s))  HERPES SIMPLEX VIRUS CULTURE     Status: Normal (Preliminary result)   Collection Time   07/08/12  7:20 PM      Component Value Range Status Comment   Specimen Description MOUTH   Final    Special Requests Normal   Final    Culture CULTURE IN PROGRESS   Final    Report Status PENDING   Incomplete     Studies/Results: No results found.   Assessment/Plan: Day 8 ACV  Disseminated HSV  Await viral Cx (although notoriously not very sensitive). CH50 normal,  Serum Ig's not decreased. HIV (-)  Spoke with CM, he can get his rx filled at [facility] pharmacy.  Would- complete 14 days of antiviral rx (when able to take Po, change to valtrex 500mg  bid to complete course). When he completes this, change to valtrex 500mg  qday.  Glad to see him in ID clinic for f/u if desired. Available if questions  [name] [facility] [phone] 07/12/2012, 9:07 AM   LOS: 5 days

## 2012-09-16 ENCOUNTER — Emergency Department (HOSPITAL_COMMUNITY)
Admission: EM | Admit: 2012-09-16 | Discharge: 2012-09-16 | Disposition: A | Discharge status: Home / Self Care | Payer: Medicaid Other | Attending: Emergency Medicine | Admitting: Emergency Medicine

## 2012-09-16 ENCOUNTER — Encounter (HOSPITAL_COMMUNITY): Payer: Self-pay | Admitting: *Deleted

## 2012-09-16 DIAGNOSIS — Z8619 Personal history of other infectious and parasitic diseases: Secondary | ICD-10-CM | POA: Insufficient documentation

## 2012-09-16 DIAGNOSIS — A5909 Other urogenital trichomoniasis: Secondary | ICD-10-CM | POA: Insufficient documentation

## 2012-09-16 DIAGNOSIS — F17200 Nicotine dependence, unspecified, uncomplicated: Secondary | ICD-10-CM | POA: Insufficient documentation

## 2012-09-16 DIAGNOSIS — J3489 Other specified disorders of nose and nasal sinuses: Secondary | ICD-10-CM | POA: Insufficient documentation

## 2012-09-16 DIAGNOSIS — Z8614 Personal history of Methicillin resistant Staphylococcus aureus infection: Secondary | ICD-10-CM | POA: Insufficient documentation

## 2012-09-16 DIAGNOSIS — A599 Trichomoniasis, unspecified: Secondary | ICD-10-CM

## 2012-09-16 LAB — WET PREP, GENITAL: Clue Cells Wet Prep HPF POC: NONE SEEN

## 2012-09-16 MED ORDER — CEFTRIAXONE SODIUM 250 MG IJ SOLR
250.0000 mg | Freq: Once | INTRAMUSCULAR | Status: AC
  Administered 2012-09-16: 250 mg via INTRAMUSCULAR
  Filled 2012-09-16: qty 250

## 2012-09-16 MED ORDER — METRONIDAZOLE 500 MG PO TABS: 500.0000 mg | ORAL_TABLET | Freq: Two times a day (BID) | ORAL | Status: DC

## 2012-09-16 MED ORDER — AZITHROMYCIN 1 G PO PACK
1.0000 g | PACK | Freq: Once | ORAL | Status: AC
  Administered 2012-09-16: 1 g via ORAL
  Filled 2012-09-16: qty 1

## 2012-09-16 MED ORDER — LIDOCAINE HCL (PF) 1 % IJ SOLN
INTRAMUSCULAR | Status: AC
  Administered 2012-09-16: 0.9 mL
  Filled 2012-09-16: qty 5

## 2012-09-16 NOTE — ED Notes (Signed)
Pt reports that his wife was tested 2 days ago and diagnosised with an STD.  Reports that he only has 1 sexual partner.  No symptoms of an STD-no discharge or itching or burning.

## 2012-09-16 NOTE — ED Notes (Signed)
Pt was exposed to GC/Chlamydia. Denies sx at this time.

## 2012-09-16 NOTE — ED Notes (Signed)
Discharge instructions reviewed. Pt verbalized understanding.  

## 2012-09-16 NOTE — ED Provider Notes (Signed)
History   This chart was scribed for Jones Skene, MD by Leone Payor, ED Scribe. This patient was seen in room TR09C/TR09C and the patient's care was started at 2:38PM .   CSN: [id number]  Arrival date & time 09/16/12  1438   None     Chief Complaint  Patient presents with  . SEXUALLY TRANSMITTED DISEASE     The history is provided by the patient. No language interpreter was used.    Paul House is a 21 y.o. male who presents to the Emergency Department complaining of potential STD (possibly trichomoniasis) starting 2 days ago. Pt reports that his wife was tested and diagnosed with an STD 2 days ago. Pt denies penile discharge, itching, burning, penis/testicular pain, chest pain, SOB, abdominal pain, nausea, vomiting, diarrhea. He has mild runny nose and congestion.   Pt has h/o mouth ulcers, herpes, MRSA. Pt is a current everyday smoker and occasional alcohol user. Past Medical History  Diagnosis Date  . Mouth ulcers   . MRSA (methicillin resistant staph aureus) culture positive   . Herpes     History reviewed. No pertinent past surgical history.  History reviewed. No pertinent family history.  History  Substance Use Topics  . Smoking status: Current Every Day Smoker -- 0.2 packs/day    Types: Cigarettes  . Smokeless tobacco: Not on file  . Alcohol Use: Yes     Comment: occ      Review of Systems At least 10pt or greater review of systems completed and are negative except where specified in the HPI. Allergies  Review of patient's allergies indicates no known allergies.  Home Medications   Current Outpatient Rx  Name  Route  Sig  Dispense  Refill  . VALACYCLOVIR HCL 500 MG PO TABS   Oral   Take 1 tablet (500 mg total) by mouth daily.   15 tablet   0     Please follow up with ID before the medication run ...     BP 141/83  Pulse 75  Temp 97.8 F (36.6 C) (Oral)  Resp 18  SpO2 100%  Physical Exam  Nursing notes reviewed.  Electronic  medical record reviewed. VITAL SIGNS:   Filed Vitals:   09/16/12 1449  BP: 141/83  Pulse: 75  Temp: 97.8 F (36.6 C)  TempSrc: Oral  Resp: 18  SpO2: 100%   CONSTITUTIONAL: Awake, oriented, appears non-toxic, wearing dirty clothing HENT: Atraumatic, normocephalic, oral mucosa pink and moist, airway patent. Nares patent without drainage. External ears normal. EYES: Conjunctiva clear, EOMI, PERRLA NECK: Trachea midline, non-tender, supple CARDIOVASCULAR: Normal heart rate, Normal rhythm, No murmurs, rubs, gallops PULMONARY/CHEST: Clear to auscultation, no rhonchi, wheezes, or rales. Symmetrical breath sounds. Non-tender. ABDOMINAL: Non-distended, soft, non-tender - no rebound or guarding.  BS normal. GU: Normal circumcised male, no testicular tenderness to palpation, no skin lesions, no lymphadenopathy, no hernias appreciated NEUROLOGIC: Non-focal, moving all four extremities, no gross sensory or motor deficits. EXTREMITIES: No clubbing, cyanosis, or edema SKIN: Warm, Dry, No erythema, No rash  ED Course  Procedures (including critical care time)  DIAGNOSTIC STUDIES: Oxygen Saturation is 100% on room air, normal by my interpretation.    COORDINATION OF CARE:  3:58 PM Discussed treatment plan which includes lab work with pt at bedside and pt agreed to plan.     Labs Reviewed - No data to display No results found.   1. Trichomonas       MDM  Patient exposed to Trichomonas, positive on  wet prep-will treat for GC, Chlamydia, and trichomonas. Patient is cautioned not to drink alcohol when taking metronidazole. Patient understands accepts medical plan as it's been dictated and does realize he should get tested for further STDs.  I explained the diagnosis and have given explicit precautions to return to the ER including any other new or worsening symptoms. The patient understands and accepts the medical plan as it's been dictated and I have answered their questions. Discharge  instructions concerning home care and prescriptions have been given.  The patient is STABLE and is discharged to home in good condition.   I personally performed the services described in this documentation, which was scribed in my presence. The recorded information has been reviewed and is accurate. Jones Skene, M.D.    Jones Skene, MD 09/16/12 1734

## 2012-10-02 ENCOUNTER — Inpatient Hospital Stay (HOSPITAL_COMMUNITY)
Admission: EM | Admit: 2012-10-02 | Discharge: 2012-10-07 | DRG: 607 | Disposition: A | Discharge status: Home / Self Care | Payer: Medicaid Other | Attending: Internal Medicine | Admitting: Internal Medicine

## 2012-10-02 ENCOUNTER — Encounter (HOSPITAL_COMMUNITY): Payer: Self-pay

## 2012-10-02 DIAGNOSIS — R739 Hyperglycemia, unspecified: Secondary | ICD-10-CM

## 2012-10-02 DIAGNOSIS — F17200 Nicotine dependence, unspecified, uncomplicated: Secondary | ICD-10-CM | POA: Diagnosis present

## 2012-10-02 DIAGNOSIS — Z8614 Personal history of Methicillin resistant Staphylococcus aureus infection: Secondary | ICD-10-CM

## 2012-10-02 DIAGNOSIS — N179 Acute kidney failure, unspecified: Secondary | ICD-10-CM

## 2012-10-02 DIAGNOSIS — K5900 Constipation, unspecified: Secondary | ICD-10-CM | POA: Diagnosis present

## 2012-10-02 DIAGNOSIS — R1310 Dysphagia, unspecified: Secondary | ICD-10-CM | POA: Diagnosis present

## 2012-10-02 DIAGNOSIS — F17210 Nicotine dependence, cigarettes, uncomplicated: Secondary | ICD-10-CM

## 2012-10-02 DIAGNOSIS — B009 Herpesviral infection, unspecified: Principal | ICD-10-CM

## 2012-10-02 DIAGNOSIS — A6001 Herpesviral infection of penis: Secondary | ICD-10-CM | POA: Diagnosis present

## 2012-10-02 LAB — COMPREHENSIVE METABOLIC PANEL
ALT: 10 U/L (ref 0–53)
Albumin: 3.8 g/dL (ref 3.5–5.2)
Calcium: 9.7 mg/dL (ref 8.4–10.5)
GFR calc Af Amer: >90 mL/min (ref >90)
Glucose, Bld: 85 mg/dL (ref 70–99)
Sodium: 136 mEq/L (ref 135–145)
Total Protein: 8.8 g/dL — ABNORMAL HIGH (ref 6.0–8.3)

## 2012-10-02 LAB — URINALYSIS, ROUTINE W REFLEX MICROSCOPIC
Glucose, UA: NEGATIVE mg/dL
Leukocytes, UA: NEGATIVE
Protein, ur: 100 mg/dL — AB
Specific Gravity, Urine: 1.042 — ABNORMAL HIGH (ref 1.005–1.030)
pH: 6 (ref 5.0–8.0)

## 2012-10-02 LAB — CBC WITH DIFFERENTIAL/PLATELET
Hemoglobin: 15.1 g/dL (ref 13.0–17.0)
Lymphocytes Relative: 23 % (ref 12–46)
Lymphs Abs: 1.6 10*3/uL (ref 0.7–4.0)
Monocytes Relative: 14 % — ABNORMAL HIGH (ref 3–12)
Neutro Abs: 4.2 10*3/uL (ref 1.7–7.7)
Neutrophils Relative %: 60 % (ref 43–77)
Platelets: 214 10*3/uL (ref 150–400)
RBC: 4.71 MIL/uL (ref 4.22–5.81)
WBC: 6.9 10*3/uL (ref 4.0–10.5)

## 2012-10-02 LAB — URINE MICROSCOPIC-ADD ON

## 2012-10-02 MED ORDER — ENOXAPARIN SODIUM 40 MG/0.4ML ~~LOC~~ SOLN
40.0000 mg | SUBCUTANEOUS | Status: DC
  Administered 2012-10-02 – 2012-10-06 (×5): 40 mg via SUBCUTANEOUS
  Filled 2012-10-02 (×7): qty 0.4

## 2012-10-02 MED ORDER — ACETAMINOPHEN 325 MG PO TABS: 650.0000 mg | ORAL_TABLET | Freq: Four times a day (QID) | ORAL | Status: DC | PRN

## 2012-10-02 MED ORDER — ONDANSETRON HCL 4 MG/2ML IJ SOLN
4.0000 mg | Freq: Once | INTRAMUSCULAR | Status: AC
  Administered 2012-10-02: 4 mg via INTRAVENOUS
  Filled 2012-10-02: qty 2

## 2012-10-02 MED ORDER — SODIUM CHLORIDE 0.9 % IV SOLN
INTRAVENOUS | Status: AC
  Administered 2012-10-02: 18:00:00 via INTRAVENOUS

## 2012-10-02 MED ORDER — HYDROMORPHONE HCL PF 1 MG/ML IJ SOLN
1.0000 mg | INTRAMUSCULAR | Status: AC | PRN
  Administered 2012-10-02: 1 mg via INTRAVENOUS
  Filled 2012-10-02: qty 1

## 2012-10-02 MED ORDER — MORPHINE SULFATE 2 MG/ML IJ SOLN
1.0000 mg | INTRAMUSCULAR | Status: DC | PRN
  Administered 2012-10-03 – 2012-10-04 (×2): 1 mg via INTRAVENOUS
  Filled 2012-10-02 (×3): qty 1

## 2012-10-02 MED ORDER — ONDANSETRON HCL 4 MG PO TABS: 4.0000 mg | ORAL_TABLET | Freq: Four times a day (QID) | ORAL | Status: DC | PRN

## 2012-10-02 MED ORDER — LIDOCAINE VISCOUS 2 % MT SOLN
20.0000 mL | OROMUCOSAL | Status: DC | PRN
  Filled 2012-10-02: qty 20

## 2012-10-02 MED ORDER — MORPHINE SULFATE 4 MG/ML IJ SOLN
4.0000 mg | Freq: Once | INTRAMUSCULAR | Status: AC
  Administered 2012-10-02: 4 mg via INTRAVENOUS
  Filled 2012-10-02: qty 1

## 2012-10-02 MED ORDER — ONDANSETRON HCL 4 MG/2ML IJ SOLN: 4.0000 mg | Freq: Three times a day (TID) | INTRAMUSCULAR | Status: AC | PRN

## 2012-10-02 MED ORDER — SODIUM CHLORIDE 0.9 % IV BOLUS (SEPSIS)
2000.0000 mL | Freq: Once | INTRAVENOUS | Status: AC
  Administered 2012-10-02: 2000 mL via INTRAVENOUS

## 2012-10-02 MED ORDER — ONDANSETRON HCL 4 MG/2ML IJ SOLN: 4.0000 mg | Freq: Four times a day (QID) | INTRAMUSCULAR | Status: DC | PRN

## 2012-10-02 MED ORDER — DEXTROSE 5 % IV SOLN
10.0000 mg/kg | Freq: Three times a day (TID) | INTRAVENOUS | Status: DC
  Administered 2012-10-02 – 2012-10-07 (×14): 610 mg via INTRAVENOUS
  Filled 2012-10-02 (×16): qty 12.2

## 2012-10-02 MED ORDER — METHYLPREDNISOLONE SODIUM SUCC 125 MG IJ SOLR: 60.0000 mg | Freq: Once | INTRAMUSCULAR | Status: DC

## 2012-10-02 NOTE — ED Notes (Signed)
Pt had herpes 1 and herpes 2 outbreak x 2 weeks. Pt is unable to talk or eat. Pt has outbreaks aprox every 3 months. Pt is currently on Acyclovir but has been unable to take it beginning Sunday due to severe oral pain. Pt wife is at bedside and is able to communicate HX

## 2012-10-02 NOTE — H&P (Signed)
[facility] History and Physical  Paul House WUJ:[id number] DOB: [birth year] DOA: 10/02/2012   PCP: Default, Provider, MD  Specialists: Dr Ninetta Lights in the past  Chief Complaint: pain and recurrence of HSV in the mouth.   HPI: Paul House is a 21 y.o. 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 He came in with oral ulcers with pain and erythema and with some pus draining from the gums.  He is unable to talk because of the pain in th emouth , but his airway is patent. He is being admitted to medical service for management of disseminated HSV infection.    Review of Systems: The patient denies anorexia, fever, weight loss,, vision loss, decreased hearing, hoarseness, chest pain, syncope, dyspnea on exertion, peripheral edema, balance deficits, hemoptysis, abdominal pain, melena, hematochezia, severe indigestion/heartburn, hematuria, incontinence, muscle weakness,  transient blindness, difficulty walking, depression, unusual weight change,  enlarged lymph nodes, angioedema, and breast masses.    Past Medical History  Diagnosis Date  . Mouth ulcers   . MRSA (methicillin resistant staph aureus) culture positive   . Herpes    History reviewed. No pertinent past surgical history. Social History:  reports that he has been smoking Cigarettes.  He has been smoking about .25 packs per day. He has never used smokeless tobacco. He reports that he drinks alcohol. He reports that he does not use illicit drugs.  where does patient live--home,  No Known Allergies  Family History  Problem Relation Age of Onset  . Heart disease Mother     Prior to Admission medications   Medication Sig Start Date End Date Taking? Authorizing Provider  valACYclovir (VALTREX) 500 MG tablet Take 1 tablet (500 mg total) by mouth daily. 07/12/12  Yes Kathlen Mody, MD   Physical Exam: Filed Vitals:   10/02/12  1328 10/02/12 1700  BP: 146/87   Pulse: 96   Temp: 98.5 F (36.9 C)   TempSrc: Axillary   Resp: 16   Weight:  61.236 kg (135 lb)  SpO2: 100%     Constitutional: Vital signs reviewed.  Patient is a well-developed and well-nourished in no acute distress and cooperative with exam. Alert and oriented x3.  Head: Normocephalic and atraumatic Mouth: crusty lesions over the lips , ulcerations over the oral mucosa, tongue, gingiva. Foul odor. Airway is patent.  Eyes: PERRL, EOMI, conjunctivae normal, No scleral icterus.  Neck: Supple, Trachea midline normal ROM, No JVD, mass, thyromegaly, or carotid bruit present.  Cardiovascular: RRR, S1 normal, S2 normal, no MRG, pulses symmetric and intact bilaterally Pulmonary/Chest: CTAB, no wheezes, rales, or rhonchi Abdominal: Soft. Non-tender, non-distended, bowel sounds are normal, no masses, organomegaly, or guarding present.  Musculoskeletal: No joint deformities, erythema, or stiffness, ROM full and no nontender Hematology: no cervical, inginal, or axillary adenopathy.  Neurological: A&O x3, Strength is normal and symmetric bilaterally, cranial nerve II-XII are grossly intact, no focal motor deficit, sensory intact to light touch bilaterally.  Skin: Warm, dry and intact. No rash, cyanosis, or clubbing.      Labs on Admission:  Basic Metabolic Panel:  Lab 10/02/12 9562  NA 136  K 4.3  CL 97  CO2 25  GLUCOSE 85  BUN 16  CREATININE 1.02  CALCIUM 9.7  MG --  PHOS --   Liver Function Tests:  Lab 10/02/12 1504  AST 19  ALT 10  ALKPHOS 86  BILITOT 1.8*  PROT 8.8*  ALBUMIN 3.8  No results found for this basename: LIPASE:5,AMYLASE:5 in the last 168 hours No results found for this basename: AMMONIA:5 in the last 168 hours CBC:  Lab 10/02/12 1504  WBC 6.9  NEUTROABS 4.2  HGB 15.1  HCT 41.7  MCV 88.5  PLT 214   Cardiac Enzymes: No results found for this basename: CKTOTAL:5,CKMB:5,CKMBINDEX:5,TROPONINI:5 in the last 168  hours  BNP (last 3 results) No results found for this basename: PROBNP:3 in the last 8760 hours CBG: No results found for this basename: GLUCAP:5 in the last 168 hours  Radiological Exams on Admission: No results found.    Assessment/Plan Active Problems:    1.Disseminated HSV  Infection:  - admit for IV ACYCLOVIR - start IV SOLUMEDROL 60 mg Q12hr to decrease the inflammation.  -  Oral viscous lidocaine - pain control and IV Fluids -  Clear liquid diet. - will call ID consult in am for further recommendations.   2. DVT prophylaxis.    Code Status: full code Family Communication: none at bedside Disposition Plan: possibly 2 to 3 days.     [facility] Pager [phone]  If 7PM-7AM, please contact night-coverage [email or web address] Password [facility] 10/02/2012, 7:13 PM

## 2012-10-02 NOTE — ED Provider Notes (Signed)
History     CSN: [id number]  Arrival date & time 10/02/12  1308   First MD Initiated Contact with Patient 10/02/12 1314      No chief complaint on file.   (Consider location/radiation/quality/duration/timing/severity/associated sxs/prior treatment) HPI 21 year old male presents to the emergency department for chief complaint of herpetic outbreak break.  He has a past medical history of disseminated herpes simplex.  He has had more frequent outbreaks over the past year every 3 months.  He was recently admitted in September of this year for the same complaint.  His test results and workup by infectious disease Negative HIV.  Onset of symptoms was 5 days ago.  Has been worsening he has been unable to eat drink or swallow.  He is having to spit out his throat secretions and saliva.  He is been unable to take his acyclovir ear due to his odynophagia.  He has no difficulty breathing.  He has had some subjective fever.  He currently has an a genital outbreak as well.  He has no other complaints at this time.  History is gathered mostly by his wife.  He can nod yes and no.  Past Medical History  Diagnosis Date  . Mouth ulcers   . MRSA (methicillin resistant staph aureus) culture positive   . Herpes     No past surgical history on file.  No family history on file.  History  Substance Use Topics  . Smoking status: Current Every Day Smoker -- 0.2 packs/day    Types: Cigarettes  . Smokeless tobacco: Not on file  . Alcohol Use: Yes     Comment: occ      Review of Systems Ten systems reviewed and are negative for acute change, except as noted in the HPI.   Allergies  Review of patient's allergies indicates no known allergies.  Home Medications   Current Outpatient Rx  Name  Route  Sig  Dispense  Refill  . VALACYCLOVIR HCL 500 MG PO TABS   Oral   Take 1 tablet (500 mg total) by mouth daily.   15 tablet   0     Please follow up with ID before the medication run ...     BP  146/87  Pulse 96  Temp 98.5 F (36.9 C) (Axillary)  Resp 16  SpO2 100%  Physical Exam Physical Exam  Nursing note and vitals reviewed. Constitutional: He appears well-developed and well-nourished. No distress.  HENT:  Head: Normocephalic and atraumatic.  Eyes: Conjunctivae normal are normal. No scleral icterus.  Neck: Normal range of motion. Neck supple.  Cardiovascular: Normal rate, regular rhythm and normal heart sounds.   Pulmonary/Chest: Effort normal and breath sounds normal. No respiratory distress.  Abdominal: Soft. There is no tenderness.  Musculoskeletal: He exhibits no edema.  Neurological: He is alert.  Skin: Skin is warm and dry. He is not diaphoretic.  Patient has lesions covering the lips, oral mucosa, pharynx, tongue and gingiva.  He has trismus and swelling.  He has extremely foul odor of breath and appears to have purulent and mucousy discharge from the back of the throat.  His airway is patent.   Penile exam: circumcised, lesion on the left anterior surface and surrounding the meatus. Psychiatric: His behavior is normal.    ED Course  Procedures (including critical care time)  Labs Reviewed - No data to display No results found.   1. Herpes simplex infection       MDM  2:19 PM BP 146/87  Pulse 96  Temp 98.5 F (36.9 C) (Axillary)  Resp 16  SpO2 100% Patient appears distressed and in pain.  I  Am ordering UA as he had Very dark urine, which may be due to dehydration. May need IV acyclovir and admission.      5:01 PM Patient unable to swallow. I have spoken with Dr. Blake Divine who has agreed to admit the patient for IV acyclovir.  Arthor Captain, PA-C 10/03/12 1624

## 2012-10-02 NOTE — ED Notes (Signed)
RN to obtain labs with start of IV 

## 2012-10-03 DIAGNOSIS — K1370 Unspecified lesions of oral mucosa: Secondary | ICD-10-CM

## 2012-10-03 MED ORDER — DEXTROSE-NACL 5-0.9 % IV SOLN
INTRAVENOUS | Status: DC
  Administered 2012-10-03 – 2012-10-07 (×5): via INTRAVENOUS

## 2012-10-03 NOTE — Progress Notes (Signed)
[facility] PROGRESS NOTE  Paul House QIO:[id number] DOB: 1990-10-31 DOA: 10/02/2012 PCP: Default, Provider, MD  Assessment/Plan: Disseminated HSV Infection Reported taking valcyclovir 500 mg daily. Continue IV Acyclovir and IV Solumedrol 60 mg Q12hr to decrease the inflammation. Continue oral viscous lidocaine and pain control and IV Fluids. Clear liquid diet, advance as tolerated. Briefly discussed with ID, Dr. Ninetta Lights, who agreed with the above management. Has had immunologic workup in September and October of 2013.  Tobacco use Counseled on cessation.  Code Status: Full code. Family Communication: Significant other in the room. Disposition Plan: Pending.  Consultants:  Dr. Ninetta Lights, ID  Procedures:  None  Antibiotics/Antiviral therapy:  Acyclovir 10/02/2012  HPI/Subjective: Still having soreness in his mouth.  Objective: Filed Vitals:   10/02/12 1700 10/02/12 2044 10/02/12 2111 10/03/12 0600  BP:  115/60 120/75 135/76  Pulse:  82 87 94  Temp:  99.4 F (37.4 C) 100.1 F (37.8 C) 99.4 F (37.4 C)  TempSrc:  Oral Oral   Resp:  16 16 16   Height:   6' (1.829 m)   Weight: 61.236 kg (135 lb)  66.089 kg (145 lb 11.2 oz)   SpO2:  96% 99% 98%    Intake/Output Summary (Last 24 hours) at 10/03/12 1111 Last data filed at 10/03/12 0500  Gross per 24 hour  Intake 1729.7 ml  Output    400 ml  Net 1329.7 ml   Filed Weights   10/02/12 1700 10/02/12 2111  Weight: 61.236 kg (135 lb) 66.089 kg (145 lb 11.2 oz)    Exam: Physical Exam: General: Awake, Oriented, No acute distress. HEENT: Oral ulcers with dried blood in his lips. Neck: Supple CV: S1 and S2 Lungs: Clear to ascultation bilaterally Abdomen: Soft, Nontender, Nondistended, +bowel sounds. Ext: Good pulses. Trace edema.  Data Reviewed: Basic Metabolic Panel:  Lab 10/02/12 3244  NA 136  K 4.3  CL 97  CO2 25  GLUCOSE 85  BUN 16  CREATININE 1.02  CALCIUM 9.7  MG --  PHOS --   Liver  Function Tests:  Lab 10/02/12 1504  AST 19  ALT 10  ALKPHOS 86  BILITOT 1.8*  PROT 8.8*  ALBUMIN 3.8   No results found for this basename: LIPASE:5,AMYLASE:5 in the last 168 hours No results found for this basename: AMMONIA:5 in the last 168 hours CBC:  Lab 10/02/12 1504  WBC 6.9  NEUTROABS 4.2  HGB 15.1  HCT 41.7  MCV 88.5  PLT 214   Cardiac Enzymes: No results found for this basename: CKTOTAL:5,CKMB:5,CKMBINDEX:5,TROPONINI:5 in the last 168 hours BNP (last 3 results) No results found for this basename: PROBNP:3 in the last 8760 hours CBG: No results found for this basename: GLUCAP:5 in the last 168 hours  No results found for this or any previous visit (from the past 240 hour(s)).   Studies: No results found.  Scheduled Meds:   . acyclovir  10 mg/kg Intravenous Q8H  . enoxaparin (LOVENOX) injection  40 mg Subcutaneous Q24H  . methylPREDNISolone (SOLU-MEDROL) injection  60 mg Intravenous Once   Continuous Infusions:   Active Problems:  Herpes simplex infection    Time spent: 25 mins    Paul House A  [facility] Pager [phone]. If 8PM-8AM, please contact night-coverage at [email or web address], password [facility] 10/03/2012, 11:11 AM  LOS: 1 day

## 2012-10-03 NOTE — Progress Notes (Signed)
Nutrition Brief Note  Patient identified on the Malnutrition Screening Tool (MST) Report  Body mass index is 19.76 kg/(m^2). Pt meets criteria for wnl based on current BMI.   Current diet order is clear liquids, patient is consuming approximately <50% of meals at this time per his report. Labs and medications reviewed.   Pt states that he is able to consume some liquids when he take mouth-numbing medication.  Pt states that he lost ~6 lbs PTA.  Pt reports some appetite but decreased intake due to pain.  No nutrition interventions warranted at this time.  Will monitor for diet advancement with healing of mouth sores. If nutrition issues arise, please consult RD.   Loyce Dys, MS RD LDN Clinical Inpatient Dietitian Pager: [phone] Weekend/After hours pager: [phone]

## 2012-10-03 NOTE — Progress Notes (Signed)
Infectious disease called to clarify if pt needs to be on any precautions per MD request.  ID stated only standard precautions is needed.

## 2012-10-03 NOTE — Consult Note (Signed)
INFECTIOUS DISEASE CONSULT NOTE  Date of Admission:  10/02/2012  Date of Consult:  10/03/2012  Reason for Consult: Oral ulcers Referring Physician: Betti Cruz  Impression/Recommendation Oral Ulcers  Would- send specimen for HIV Cx, PCR.  Consider Bx of his lesions?  Comment- his case is very unusual. Acyclovir resistance in HSV is extremely rare (<1%) and typically seen in immunocompromised pts. I am suspect that HSV is truly his dx.    Thank you so much for this interesting consult,   Paul House [phone]  Paul House is an 21 y.o. male.  HPI: 21 yo M with hx of recurrent oral ulcers. He was last seen in [facility] 9-29 to 10-4 for this and was treated with acyclovir. His HSV cx was negative, HIV was (-), his complement level and IgG and IgA were all above normal. His IgM was normal  After d/c he was seen by his PCP at [facility] and was continued on his anti-viral rx. He was told to take 1 tab daily and 2 tabs when he has an outbreak.  He returns 12-25 with recurrent oral lesions and d/c from his gums despite taking his anti-viral rx. States lesions started ~ 1-2 weeks ago. He also has penile lesion. Has dysphagia, no sob, some cough.  ROS- has had fevers, no dysuria, has had constipation. See HPI.    Past Medical History  Diagnosis Date  . Mouth ulcers   . MRSA (methicillin resistant staph aureus) culture positive   . Herpes     History reviewed. No pertinent past surgical history.   No Known Allergies  Medications:  Scheduled:   . acyclovir  10 mg/kg Intravenous Q8H  . enoxaparin (LOVENOX) injection  40 mg Subcutaneous Q24H  . methylPREDNISolone (SOLU-MEDROL) injection  60 mg Intravenous Once    Total days of antibiotics 2 (acyclovir)          Social History:  reports that he has been smoking Cigarettes.  He has been smoking about .25 packs per day. He has never used smokeless tobacco. He reports that he drinks alcohol. He reports that he does not use illicit  drugs.  Family History  Problem Relation Age of Onset  . Heart disease Mother     General ROS: see above  Blood pressure 111/79, pulse 84, temperature 98.5 F (36.9 C), temperature source Oral, resp. rate 16, height 6' (1.829 m), weight 66.089 kg (145 lb 11.2 oz), SpO2 99.00%. General appearance: alert, cooperative and mild distress Eyes: negative findings: pupils equal, round, reactive to light and accomodation Throat: diffuse scabs, pustular lesion on his lips.  Neck: no adenopathy and supple, symmetrical, trachea midline Lungs: clear to auscultation bilaterally Heart: regular rate and rhythm Abdomen: normal findings: bowel sounds normal and soft, non-tender Male genitalia: abnormal findings: single, small ulcer on his L lateral corona. no d/c.  Extremities: edema none. Skin: Skin color, texture, turgor normal. No rashes or lesions or except as noted in other exams (throat and genital)   Results for orders placed during the hospital encounter of 10/02/12 (from the past 48 hour(s))  URINALYSIS, ROUTINE W REFLEX MICROSCOPIC     Status: Abnormal   Collection Time   10/02/12  2:15 PM      Component Value Range Comment   Color, Urine AMBER (*) YELLOW BIOCHEMICALS MAY BE AFFECTED BY COLOR   APPearance CLEAR  CLEAR    Specific Gravity, Urine 1.042 (*) 1.005 - 1.030    pH 6.0  5.0 - 8.0    Glucose,  UA NEGATIVE  NEGATIVE mg/dL    Hgb urine dipstick NEGATIVE  NEGATIVE    Bilirubin Urine MODERATE (*) NEGATIVE    Ketones, ur >80 (*) NEGATIVE mg/dL    Protein, ur 409 (*) NEGATIVE mg/dL    Urobilinogen, UA 1.0  0.0 - 1.0 mg/dL    Nitrite NEGATIVE  NEGATIVE    Leukocytes, UA NEGATIVE  NEGATIVE   URINE MICROSCOPIC-ADD ON     Status: Normal   Collection Time   10/02/12  2:15 PM      Component Value Range Comment   WBC, UA 0-2  <3 WBC/hpf    RBC / HPF 0-2  <3 RBC/hpf    Urine-Other MUCOUS PRESENT     CBC WITH DIFFERENTIAL     Status: Abnormal   Collection Time   10/02/12  3:04 PM       Component Value Range Comment   WBC 6.9  4.0 - 10.5 K/uL    RBC 4.71  4.22 - 5.81 MIL/uL    Hemoglobin 15.1  13.0 - 17.0 g/dL    HCT 81.1  91.4 - 78.2 %    MCV 88.5  78.0 - 100.0 fL    MCH 32.1  26.0 - 34.0 pg    MCHC 36.2 (*) 30.0 - 36.0 g/dL    RDW 95.6 (*) 21.3 - 15.5 %    Platelets 214  150 - 400 K/uL    Neutrophils Relative 60  43 - 77 %    Neutro Abs 4.2  1.7 - 7.7 K/uL    Lymphocytes Relative 23  12 - 46 %    Lymphs Abs 1.6  0.7 - 4.0 K/uL    Monocytes Relative 14 (*) 3 - 12 %    Monocytes Absolute 1.0  0.1 - 1.0 K/uL    Eosinophils Relative 3  0 - 5 %    Eosinophils Absolute 0.2  0.0 - 0.7 K/uL    Basophils Relative 0  0 - 1 %    Basophils Absolute 0.0  0.0 - 0.1 K/uL   COMPREHENSIVE METABOLIC PANEL     Status: Abnormal   Collection Time   10/02/12  3:04 PM      Component Value Range Comment   Sodium 136  135 - 145 mEq/L    Potassium 4.3  3.5 - 5.1 mEq/L    Chloride 97  96 - 112 mEq/L    CO2 25  19 - 32 mEq/L    Glucose, Bld 85  70 - 99 mg/dL    BUN 16  6 - 23 mg/dL    Creatinine, Ser 0.86  0.50 - 1.35 mg/dL    Calcium 9.7  8.4 - 57.8 mg/dL    Total Protein 8.8 (*) 6.0 - 8.3 g/dL    Albumin 3.8  3.5 - 5.2 g/dL    AST 19  0 - 37 U/L    ALT 10  0 - 53 U/L    Alkaline Phosphatase 86  39 - 117 U/L    Total Bilirubin 1.8 (*) 0.3 - 1.2 mg/dL    GFR calc non Af Amer >90  >90 mL/min    GFR calc Af Amer >90  >90 mL/min       Component Value Date/Time   SDES MOUTH 07/08/2012 1920   SPECREQUEST Normal 07/08/2012 1920   CULT No Herpes Simplex Virus detected. 07/08/2012 1920   REPTSTATUS 07/12/2012 FINAL 07/08/2012 1920   No results found. No results found for this or any previous  visit (from the past 240 hour(s)).    10/03/2012, 9:40 PM     LOS: 1 day

## 2012-10-04 LAB — CBC
Hemoglobin: 13.2 g/dL (ref 13.0–17.0)
MCH: 31.2 pg (ref 26.0–34.0)
Platelets: 225 10*3/uL (ref 150–400)
RBC: 4.23 MIL/uL (ref 4.22–5.81)
WBC: 5.3 10*3/uL (ref 4.0–10.5)

## 2012-10-04 LAB — BASIC METABOLIC PANEL
CO2: 27 mEq/L (ref 19–32)
Calcium: 9 mg/dL (ref 8.4–10.5)
Chloride: 100 mEq/L (ref 96–112)
Glucose, Bld: 99 mg/dL (ref 70–99)
Potassium: 3.7 mEq/L (ref 3.5–5.1)
Sodium: 135 mEq/L (ref 135–145)

## 2012-10-04 LAB — HERPES SIMPLEX VIRUS(HSV) DNA BY PCR: HSV 1 DNA: NOT DETECTED

## 2012-10-04 MED ORDER — MORPHINE SULFATE 2 MG/ML IJ SOLN
1.0000 mg | INTRAMUSCULAR | Status: DC | PRN
  Administered 2012-10-04 – 2012-10-05 (×2): 2 mg via INTRAVENOUS
  Filled 2012-10-04 (×2): qty 1

## 2012-10-04 NOTE — Progress Notes (Signed)
[facility] PROGRESS NOTE  Paul House WUJ:[id number] DOB: [birth year] DOA: 10/02/2012 PCP: Default, Provider, MD  Assessment/Plan: Presumed disseminated HSV Infection Reported taking valcyclovir 500 mg daily prior to admission. Continue IV Acyclovir and IV Solumedrol 60 mg Q12hr to decrease the inflammation. Continue oral viscous lidocaine and pain control and IV Fluids. Clear liquid diet, advance as tolerated.  Viral culture, herpes simplex virus DNA by PCR, and ANA pending. Has had immunologic workup in September and October of 2013.  Tobacco use Counseled on cessation.  Code Status: Full code. Family Communication: Significant other in the room. Disposition Plan: Pending.  Consultants:  Dr. Ninetta Lights, ID  Procedures:  None  Antibiotics/Antiviral therapy:  Acyclovir 10/02/2012  HPI/Subjective: Continues to complain of soreness in his mouth.  Objective: Filed Vitals:   10/03/12 0600 10/03/12 1620 10/03/12 2134 10/04/12 0605  BP: 135/76 125/74 111/79 121/79  Pulse: 94 90 84 83  Temp: 99.4 F (37.4 C) 97.9 F (36.6 C) 98.5 F (36.9 C) 98.3 F (36.8 C)  TempSrc:  Oral Oral Oral  Resp: 16 16 16 16   Height:      Weight:      SpO2: 98% 99% 99% 99%    Intake/Output Summary (Last 24 hours) at 10/04/12 1123 Last data filed at 10/04/12 0616  Gross per 24 hour  Intake 1476.6 ml  Output    350 ml  Net 1126.6 ml   Filed Weights   10/02/12 1700 10/02/12 2111  Weight: 61.236 kg (135 lb) 66.089 kg (145 lb 11.2 oz)    Exam: Physical Exam: General: Awake, Oriented, No acute distress. HEENT: Oral ulcers with dried blood in his lips. Neck: Supple CV: S1 and S2 Lungs: Clear to ascultation bilaterally Abdomen: Soft, Nontender, Nondistended, +bowel sounds. Ext: Good pulses. Trace edema.  Data Reviewed: Basic Metabolic Panel:  Lab 10/04/12 9562 10/02/12 1504  NA 135 136  K 3.7 4.3  CL 100 97  CO2 27 25  GLUCOSE 99 85  BUN 9 16  CREATININE 0.93  1.02  CALCIUM 9.0 9.7  MG -- --  PHOS -- --   Liver Function Tests:  Lab 10/02/12 1504  AST 19  ALT 10  ALKPHOS 86  BILITOT 1.8*  PROT 8.8*  ALBUMIN 3.8   No results found for this basename: LIPASE:5,AMYLASE:5 in the last 168 hours No results found for this basename: AMMONIA:5 in the last 168 hours CBC:  Lab 10/04/12 0450 10/02/12 1504  WBC 5.3 6.9  NEUTROABS -- 4.2  HGB 13.2 15.1  HCT 37.3* 41.7  MCV 88.2 88.5  PLT 225 214   Cardiac Enzymes: No results found for this basename: CKTOTAL:5,CKMB:5,CKMBINDEX:5,TROPONINI:5 in the last 168 hours BNP (last 3 results) No results found for this basename: PROBNP:3 in the last 8760 hours CBG: No results found for this basename: GLUCAP:5 in the last 168 hours  Recent Results (from the past 240 hour(s))  VIRAL CULTURE VIRC     Status: Normal (Preliminary result)   Collection Time   10/03/12 10:15 PM      Component Value Range Status Comment   Specimen Description MOUTH   Final    Special Requests Normal   Final    Culture Culture has been initiated.   Final    Report Status PENDING   Incomplete      Studies: No results found.  Scheduled Meds:    . acyclovir  10 mg/kg Intravenous Q8H  . enoxaparin (LOVENOX) injection  40 mg Subcutaneous Q24H  . methylPREDNISolone (SOLU-MEDROL) injection  60 mg Intravenous Once   Continuous Infusions:    . dextrose 5 % and 0.9% NaCl 75 mL/hr at 10/04/12 1610    Active Problems:  Herpes simplex infection    Time spent: 20 mins    Karisa Nesser A  [facility] Pager [phone]. If 8PM-8AM, please contact night-coverage at [email or web address], password [facility] 10/04/2012, 11:23 AM  LOS: 2 days

## 2012-10-04 NOTE — Progress Notes (Signed)
Patient complains of pain in his mouth, morphine 1mg  not effective as per patient.  Dr. Betti Cruz notified.  Morphine increased to 2mg .  Will continue to monitor.

## 2012-10-04 NOTE — Progress Notes (Signed)
   INFECTIOUS DISEASE PROGRESS NOTE  ID: Paul House is a 21 y.o. male with   Active Problems:  Herpes simplex infection  Subjective: Without complaints  Abtx:  Anti-infectives     Start     Dose/Rate Route Frequency Ordered Stop   10/02/12 1800   acyclovir (ZOVIRAX) 610 mg in dextrose 5 % 100 mL IVPB        10 mg/kg  61.2 kg 112.2 mL/hr over 60 Minutes Intravenous Every 8 hours 10/02/12 1658            Medications:  Scheduled:   . acyclovir  10 mg/kg Intravenous Q8H  . enoxaparin (LOVENOX) injection  40 mg Subcutaneous Q24H  . methylPREDNISolone (SOLU-MEDROL) injection  60 mg Intravenous Once    Objective: Vital signs in last 24 hours: Temp:  [97.6 F (36.4 C)-98.5 F (36.9 C)] 97.6 F (36.4 C) (12/27 1359) Pulse Rate:  [83-88] 88  (12/27 1359) Resp:  [16-18] 18  (12/27 1359) BP: (111-121)/(78-79) 119/78 mmHg (12/27 1359) SpO2:  [99 %-100 %] 100 % (12/27 1359)   General appearance: alert, cooperative and no distress Throat: abnormal findings: multiple ulcerations on lips, bleeding, crusting scabbing.   Lab Results  Basename 10/04/12 0450 10/02/12 1504  WBC 5.3 6.9  HGB 13.2 15.1  HCT 37.3* 41.7  NA 135 136  K 3.7 4.3  CL 100 97  CO2 27 25  BUN 9 16  CREATININE 0.93 1.02  GLU -- --   Liver Panel  Basename 10/02/12 1504  PROT 8.8*  ALBUMIN 3.8  AST 19  ALT 10  ALKPHOS 86  BILITOT 1.8*  BILIDIR --  IBILI --   Sedimentation Rate No results found for this basename: ESRSEDRATE in the last 72 hours C-Reactive Protein No results found for this basename: CRP:2 in the last 72 hours  Microbiology: Recent Results (from the past 240 hour(s))  VIRAL CULTURE VIRC     Status: Normal (Preliminary result)   Collection Time   10/03/12 10:15 PM      Component Value Range Status Comment   Specimen Description MOUTH   Final    Special Requests Normal   Final    Culture Culture has been initiated.   Final    Report Status PENDING   Incomplete      Studies/Results: No results found.   Assessment/Plan: Oral Ulcers  Await studies, zcank smear, viral Cx. His PCR is negative.  He needs local wound care, suction to bedside.  He needs to be eval at [facility] setting, derm ect to work this is up. Could be bechet's, severe aphthous disease. I am doubtful that this is HSV with acyclovir failure and his PCR (-)  Check ANA  Total days of antibiotics: 3 Acyclovir IV        [name] Infectious Diseases [phone] 10/04/2012, 4:21 PM   LOS: 2 days

## 2012-10-05 MED ORDER — VITAMINS A & D EX OINT
TOPICAL_OINTMENT | CUTANEOUS | Status: AC
  Administered 2012-10-05: 12:00:00
  Filled 2012-10-05: qty 10

## 2012-10-05 NOTE — Progress Notes (Signed)
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 Continue IV Acyclovir and IV Solumedrol 60 mg Q12hr to decrease the inflammation. Continue oral viscous lidocaine and pain control and IV Fluids. Clear liquid diet, advance as tolerated.  Viral culture pending, herpes simplex virus DNA by PCR negative, and ANA pending. Has had immunologic workup in September and October of 2013.  Discussed with Dr. Ninetta Lights, ID, who recommended the patient will eventually need followup at a tertiary center given recurrence of unclear etiology.  Dr. Ninetta Lights indicated that this could be done as an outpatient.  Discussed with patient and family who were opened the possibility that were wondering if a hospital to hospital transfer could be initiated.  [facility] Dr. Wonda Cheng, who preferred the patient to followup as outpatient in clinic and also indicated that they did not have any dermatology staff available over the weekend.  If the patient had persistent pain with poor oral intake to call back [facility] tomorrow or Monday for a possible transfer. Dr. Wonda Cheng, pager, [phone] pager, however he preferred to be called through [facility] call center.  Tobacco use Counseled on cessation.  Code Status: Full code. Family Communication: Significant other in the room. Disposition Plan: Pending.  Consultants:  Dr. Ninetta Lights, ID  Procedures:  None  Antibiotics/Antiviral therapy:  Acyclovir 10/02/2012  HPI/Subjective: Still has significant pain from the oral ulcers and currently tolerating only sips of clears.  Objective: Filed Vitals:   10/03/12 2134 10/04/12 0605 10/04/12 1359 10/04/12 2200  BP: 111/79 121/79 119/78 120/76  Pulse: 84 83 88 80  Temp: 98.5 F (36.9 C) 98.3 F (36.8 C) 97.6 F (36.4 C) 97.9 F (36.6  C)  TempSrc: Oral Oral Axillary Axillary  Resp: 16 16 18 18   Height:      Weight:      SpO2: 99% 99% 100% 99%    Intake/Output Summary (Last 24 hours) at 10/05/12 1051 Last data filed at 10/05/12 0600  Gross per 24 hour  Intake 2099.4 ml  Output   1200 ml  Net  899.4 ml   Filed Weights   10/02/12 1700 10/02/12 2111  Weight: 61.236 kg (135 lb) 66.089 kg (145 lb 11.2 oz)    Exam: Physical Exam: General: Awake, Oriented, No acute distress. HEENT: Oral ulcers with dried blood in his lips. Neck: Supple CV: S1 and S2 Lungs: Clear to ascultation bilaterally Abdomen: Soft, Nontender, Nondistended, +bowel sounds. Ext: Good pulses. Trace edema.  Data Reviewed: Basic Metabolic Panel:  Lab 10/04/12 1027 10/02/12 1504  NA 135 136  K 3.7 4.3  CL 100 97  CO2 27 25  GLUCOSE 99 85  BUN 9 16  CREATININE 0.93 1.02  CALCIUM 9.0 9.7  MG -- --  PHOS -- --   Liver Function Tests:  Lab 10/02/12 1504  AST 19  ALT 10  ALKPHOS 86  BILITOT 1.8*  PROT 8.8*  ALBUMIN 3.8   No results found for this basename: LIPASE:5,AMYLASE:5 in the last 168 hours No results found for this basename: AMMONIA:5 in the last 168 hours CBC:  Lab 10/04/12 0450 10/02/12 1504  WBC 5.3 6.9  NEUTROABS -- 4.2  HGB 13.2 15.1  HCT 37.3* 41.7  MCV 88.2 88.5  PLT 225 214   Cardiac Enzymes: No results found for this basename: CKTOTAL:5,CKMB:5,CKMBINDEX:5,TROPONINI:5 in the last 168 hours BNP (last 3 results) No  results found for this basename: PROBNP:3 in the last 8760 hours CBG: No results found for this basename: GLUCAP:5 in the last 168 hours  Recent Results (from the past 240 hour(s))  VIRAL CULTURE VIRC     Status: Normal (Preliminary result)   Collection Time   10/03/12 10:15 PM      Component Value Range Status Comment   Specimen Description MOUTH   Final    Special Requests Normal   Final    Culture Culture has been initiated.   Final    Report Status PENDING   Incomplete       Studies: No results found.  Scheduled Meds:    . acyclovir  10 mg/kg Intravenous Q8H  . enoxaparin (LOVENOX) injection  40 mg Subcutaneous Q24H  . methylPREDNISolone (SOLU-MEDROL) injection  60 mg Intravenous Once   Continuous Infusions:    . dextrose 5 % and 0.9% NaCl 75 mL/hr at 10/04/12 0616    Active Problems:  Herpes simplex infection    Time spent: 20 mins    Paul House A  [facility] Pager [phone]. If 8PM-8AM, please contact night-coverage at [email or web address], password [facility] 10/05/2012, 10:51 AM  LOS: 3 days

## 2012-10-05 NOTE — Progress Notes (Signed)
   INFECTIOUS DISEASE PROGRESS NOTE  ID: Paul House is a 21 y.o. male with   Active Problems:  Herpes simplex infection  Subjective: Still having difficulty with eating.   Abtx:  Anti-infectives     Start     Dose/Rate Route Frequency Ordered Stop   10/02/12 1800   acyclovir (ZOVIRAX) 610 mg in dextrose 5 % 100 mL IVPB        10 mg/kg  61.2 kg 112.2 mL/hr over 60 Minutes Intravenous Every 8 hours 10/02/12 1658            Medications:  Scheduled:   . acyclovir  10 mg/kg Intravenous Q8H  . enoxaparin (LOVENOX) injection  40 mg Subcutaneous Q24H  . methylPREDNISolone (SOLU-MEDROL) injection  60 mg Intravenous Once    Objective: Vital signs in last 24 hours: Temp:  [97.9 F (36.6 C)] 97.9 F (36.6 C) (12/27 2200) Pulse Rate:  [80] 80  (12/27 2200) Resp:  [18] 18  (12/27 2200) BP: (120)/(76) 120/76 mmHg (12/27 2200) SpO2:  [99 %] 99 % (12/27 2200)   General appearance: alert, cooperative and no distress Throat: lips with scabs, wounds.   Lab Results  Basename 10/04/12 0450 10/02/12 1504  WBC 5.3 6.9  HGB 13.2 15.1  HCT 37.3* 41.7  NA 135 136  K 3.7 4.3  CL 100 97  CO2 27 25  BUN 9 16  CREATININE 0.93 1.02  GLU -- --   Liver Panel  Basename 10/02/12 1504  PROT 8.8*  ALBUMIN 3.8  AST 19  ALT 10  ALKPHOS 86  BILITOT 1.8*  BILIDIR --  IBILI --   Sedimentation Rate No results found for this basename: ESRSEDRATE in the last 72 hours C-Reactive Protein No results found for this basename: CRP:2 in the last 72 hours  Microbiology: Recent Results (from the past 240 hour(s))  VIRAL CULTURE VIRC     Status: Normal (Preliminary result)   Collection Time   10/03/12 10:15 PM      Component Value Range Status Comment   Specimen Description MOUTH   Final    Special Requests Normal   Final    Culture Culture has been initiated.   Final    Report Status PENDING   Incomplete     Studies/Results: No results found.   Assessment/Plan: Oral  Ulcers Would- continue supportive care Have him f/u with tertiary care as outpt.  My great appreciation to Dr Betti Cruz F/u his viral cx, tzanck smear.   Again- i am doubtful that this is HSV. Could be severe aphthous disease, bechet's? Please let me know the results of his f/u.  Dr Drue Second is available if there are any questions.  thanks  Paul House Infectious Diseases [phone] 10/05/2012, 2:19 PM   LOS: 3 days

## 2012-10-06 DIAGNOSIS — F17200 Nicotine dependence, unspecified, uncomplicated: Secondary | ICD-10-CM

## 2012-10-06 LAB — CBC
HCT: 34.7 % — ABNORMAL LOW (ref 39.0–52.0)
Hemoglobin: 12 g/dL — ABNORMAL LOW (ref 13.0–17.0)
MCH: 30.8 pg (ref 26.0–34.0)
MCHC: 34.6 g/dL (ref 30.0–36.0)
MCV: 89 fL (ref 78.0–100.0)
RDW: 11.4 % — ABNORMAL LOW (ref 11.5–15.5)

## 2012-10-06 LAB — BASIC METABOLIC PANEL
BUN: 8 mg/dL (ref 6–23)
Creatinine, Ser: 1.06 mg/dL (ref 0.50–1.35)
GFR calc Af Amer: >90 mL/min (ref >90)
GFR calc non Af Amer: >90 mL/min (ref >90)
Glucose, Bld: 108 mg/dL — ABNORMAL HIGH (ref 70–99)
Potassium: 3.5 mEq/L (ref 3.5–5.1)

## 2012-10-06 NOTE — ED Provider Notes (Signed)
Medical screening examination/treatment/procedure(s) were performed by non-physician practitioner and as supervising physician I was immediately available for consultation/collaboration.  Vickey Ewbank R. Berley Gambrell, MD 10/06/12 0855 

## 2012-10-06 NOTE — Progress Notes (Signed)
[facility] PROGRESS NOTE  Paul House [id number]:[id number] DOB: 10/19/1990 DOA: 10/02/2012 PCP: Default, Provider, MD  Assessment/Plan: Active Problems:  Herpes simplex infection    1. 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 Apparently, he gets about 3 episodes per year. This year however, he has had about 5. He came in with painful oral ulcers, associated with erythema and purulent drainage. Dr [name] provided ID consultation, and he was placed on IV Acyclovir and Solumedrol, clear fluids and oral viscous lidocaine, as well as supportive managment with analgesics and iv fluids. Viral culture is pending, herpes simplex virus DNA by PCR negative, and ANA pending. Patient has had immunologic workup in September and October of 2013. Per Dr Reddy's discussion with Dr. Ninetta Lights, ID, patient is recommended followup at a tertiary center, possibly on an outpatient basis, given recurrence of unclear etiology. Dr Betti Cruz called Dr. Wonda Cheng at [facility], who preferred the patient to followup as outpatient in clinic and also indicated that they did not have any dermatology staff available over the weekend. Will contact [facility] on 10/07/12, for a possible transfer, as patient is currently stable. Have advanced diet today. (Dr. Wonda Cheng, pager, [phone] pager, however he preferred to be called through [facility] call center).  2. Tobacco use: Patient is a smoker. He has been counseled appropriately.     Code Status: Full Code.  Family Communication:  Disposition Plan: To be determined.    Brief narrative: 21 y.o. 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 He came in with oral ulcers with pain and erythema and with some pus draining from the gums. He is unable to talk because of the pain in the mouth, but had  no respiratory difficulties. He was being admitted to medical service for further management.    Consultants:  Dr [name], ID.   Procedures:  N/A.   Antibiotics:  IV Acyclovir.   HPI/Subjective: Feels better.   Objective: Vital signs in last 24 hours: Temp:  [97.3 F (36.3 C)-97.6 F (36.4 C)] 97.6 F (36.4 C) (12/29 0606) Pulse Rate:  [68-85] 68  (12/29 0606) Resp:  [18] 18  (12/29 0606) BP: (99-116)/(66-75) 99/66 mmHg (12/29 0606) SpO2:  [97 %-100 %] 97 % (12/29 0606) Weight change:  Last BM Date: 10/05/12  Intake/Output from previous day: 12/28 0701 - 12/29 0700 In: 900 [I.V.:900] Out: 600 [Urine:600]     Physical Exam: General: Comfortable, alert, communicative, fully oriented, not short of breath at rest.  HEENT:  No clinical pallor, no jaundice, no conjunctival injection or discharge. No clinical pallor. Hydration is satisfactory. Mouth: Has crusted lesions on lower lip, and shallow ulcers are seen in the mouth, with furring of tongue. Only "shotty" cervical and submental LN.  NECK:  Supple, JVP not seen, no carotid bruits, no palpable lymphadenopathy, no palpable goiter. CHEST:  Clinically clear to auscultation, no wheezes, no crackles. HEART:  Sounds 1 and 2 heard, normal, regular, no murmurs. ABDOMEN:  Flat, soft, non-tender, no palpable organomegaly, no palpable masses, normal bowel sounds. GENITALIA:  Not examined. LOWER EXTREMITIES:  No pitting edema, palpable peripheral pulses. MUSCULOSKELETAL SYSTEM:  Unremarkable. CENTRAL NERVOUS SYSTEM:  No focal neurologic deficit on gross examination.  Lab Results:  Basename 10/06/12 0510 10/04/12 0450  WBC 5.1 5.3  HGB 12.0* 13.2  HCT 34.7* 37.3*  PLT 245 225  Basename 10/06/12 0510 10/04/12 0450  NA 142 135  K 3.5 3.7  CL 105 100  CO2 26 27  GLUCOSE 108* 99  BUN 8 9  CREATININE 1.06 0.93  CALCIUM 8.3* 9.0   Recent Results (from the past 240 hour(s))  VIRAL CULTURE VIRC     Status:  Normal (Preliminary result)   Collection Time   10/03/12 10:15 PM      Component Value Range Status Comment   Specimen Description MOUTH   Final    Special Requests Normal   Final    Culture Culture has been initiated.   Final    Report Status PENDING   Incomplete      Studies/Results: No results found.  Medications: Scheduled Meds:   . acyclovir  10 mg/kg Intravenous Q8H  . enoxaparin (LOVENOX) injection  40 mg Subcutaneous Q24H  . methylPREDNISolone (SOLU-MEDROL) injection  60 mg Intravenous Once   Continuous Infusions:   . dextrose 5 % and 0.9% NaCl 75 mL/hr at 10/05/12 1451   PRN Meds:.acetaminophen, lidocaine, morphine injection, ondansetron (ZOFRAN) IV, ondansetron    LOS: 4 days   Paul House,Paul House  [facility] Pager [phone]. If 8PM-8AM, please contact night-coverage at [email or web address], password [facility] 10/06/2012, 1:45 PM  LOS: 4 days

## 2012-10-07 LAB — ANA: Anti Nuclear Antibody(ANA): NEGATIVE

## 2012-10-07 LAB — CBC
HCT: 33.7 % — ABNORMAL LOW (ref 39.0–52.0)
Hemoglobin: 11.7 g/dL — ABNORMAL LOW (ref 13.0–17.0)
MCH: 31 pg (ref 26.0–34.0)
MCHC: 34.7 g/dL (ref 30.0–36.0)
MCV: 89.4 fL (ref 78.0–100.0)

## 2012-10-07 LAB — COMPREHENSIVE METABOLIC PANEL
Alkaline Phosphatase: 67 U/L (ref 39–117)
BUN: 11 mg/dL (ref 6–23)
Creatinine, Ser: 1.23 mg/dL (ref 0.50–1.35)
GFR calc Af Amer: >90 mL/min (ref >90)
Glucose, Bld: 113 mg/dL — ABNORMAL HIGH (ref 70–99)
Potassium: 3.4 mEq/L — ABNORMAL LOW (ref 3.5–5.1)
Total Bilirubin: 0.4 mg/dL (ref 0.3–1.2)
Total Protein: 6.9 g/dL (ref 6.0–8.3)

## 2012-10-07 MED ORDER — VITAMINS A & D EX OINT
TOPICAL_OINTMENT | CUTANEOUS | Status: AC
  Filled 2012-10-07: qty 15

## 2012-10-07 MED ORDER — LIDOCAINE VISCOUS 2 % MT SOLN: 20.0000 mL | OROMUCOSAL | Status: DC | PRN

## 2012-10-07 MED ORDER — OXYCODONE HCL 5 MG PO CAPS: 5.0000 mg | ORAL_CAPSULE | ORAL | Status: DC | PRN

## 2012-10-07 MED ORDER — METRONIDAZOLE 500 MG PO TABS: 500.0000 mg | ORAL_TABLET | Freq: Three times a day (TID) | ORAL | Status: DC

## 2012-10-07 NOTE — Discharge Summary (Signed)
Physician Discharge Summary  Edwen Mclester ZOX:[id number] DOB: June 19, 1991 DOA: 10/02/2012  PCP: Default, Provider, MD  Admit date: 10/02/2012 Discharge date: 10/07/2012  Time spent: 40 minutes  Recommendations for Outpatient Follow-up:  1. Follow up at outpatient clinic, [facility]. An appointment has been scheduled for 11/01/12 at 2:30 PM, with Dr Sherrilyn Rist. [facility] will contact patient with further details.   Discharge Diagnoses:  Active Problems:  Herpes simplex infection   Discharge Condition: Satisfactory.   Diet recommendation: Regular.   Filed Weights   10/02/12 1700 10/02/12 2111  Weight: 61.236 kg (135 lb) 66.089 kg (145 lb 11.2 oz)    History of present illness:  21 y.o. 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 He came in with oral ulcers with pain and erythema and with some pus draining from the gums. He is unable to talk because of the pain in the mouth, but had no respiratory difficulties. He was being admitted to medical service for further management.    Hospital Course:  1. 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 Apparently, he gets about 3 episodes per year. This year however, he has had about 5. He came in with painful oral ulcers, associated with erythema and purulent drainage. Dr [name] provided ID consultation, and he was placed on IV Acyclovir, Solumedrol, clear fluids and oral viscous lidocaine, as well as supportive managment with analgesics and iv fluids, with gradual clinical improvement. He was able to tolerate a soft mechanical diet on by 10/06/12. Viral culture is still pending, Herpes Simplex virus 1 & 2 DNA by PCR was negative, and ANA was negative. Patient has had immunologic workup in September and October of 2013, all of which were  non-revealing. HIV test was negative in 11/2011. Etiology of his lesions, is therefore still unclear, and considerations such as severe aphthous ulcerations, vs Behcet's disease, etc have to be entertained. Per Dr Reddy's discussion with Dr. Ninetta Lights, Iinfectious diseases specialist, patient is likely to benefit from followup at a tertiary center, possibly on an outpatient basis, given recurrence of unclear etiology. Dr Betti Cruz discussed with Dr. Wonda Cheng at [facility], pager, [phone] on 10/05/12, as did I on 10/07/12, and as patient was clinically improved followup in outpatient in clinic is being pursued. Contacted [facility] on 10/07/12, to arrange an expedited outpatient appointment.  2. Tobacco use: Patient is a smoker. He has been counseled appropriately.    Procedures:  N/A.   Consultations: Dr [name], Infectious diseases specialist.    Discharge Exam: Filed Vitals:   10/06/12 0606 10/06/12 1400 10/06/12 2244 10/07/12 1350  BP: 99/66 122/77 96/51 118/65  Pulse: 68 63 83 66  Temp: 97.6 F (36.4 C) 97.6 F (36.4 C) 98.5 F (36.9 C) 97.8 F (36.6 C)  TempSrc: Axillary Axillary Axillary Axillary  Resp: 18 18 18 18   Height:      Weight:      SpO2: 97% 100% 98% 97%    General: Comfortable, alert, communicative, fully oriented, not short of breath at rest.  HEENT: No clinical pallor, no jaundice, no conjunctival injection or discharge. No clinical pallor. Hydration is satisfactory. Mouth: Has crusted lesions on lower lip, and shallow ulcers are seen in the mouth, with furring of tongue. Only "shotty" cervical and submental LN.  NECK: Supple, JVP not seen, no carotid bruits, no palpable lymphadenopathy,  no palpable goiter.  CHEST: Clinically clear to auscultation, no wheezes, no crackles.  HEART: Sounds 1 and 2 heard, normal, regular, no murmurs.  ABDOMEN: Flat, soft, non-tender, no palpable organomegaly, no palpable masses, normal bowel sounds.  GENITALIA: Not examined.  LOWER  EXTREMITIES: No pitting edema, palpable peripheral pulses.  MUSCULOSKELETAL SYSTEM: Unremarkable.  CENTRAL NERVOUS SYSTEM: No focal neurologic deficit on gross examination.  Discharge Instructions      Discharge Orders    Future Orders Please Complete By Expires   Diet general      Increase activity slowly          Medication List     As of 10/07/2012  5:27 PM    TAKE these medications         lidocaine 2 % solution   Commonly known as: XYLOCAINE   Take 20 mLs by mouth every 4 (four) hours as needed for pain.      oxycodone 5 MG capsule   Commonly known as: OXY-IR   Take 1 capsule (5 mg total) by mouth every 4 (four) hours as needed.      valACYclovir 500 MG tablet   Commonly known as: VALTREX   Take 1 tablet (500 mg total) by mouth daily.         Follow-up Information    Please follow up. (Follow up with primary MD as needed)       Please follow up. (Follow up with Dr Sherrilyn Rist at [facility] medical outpatient clinic. Dr Kerrin Champagne office will contact you with details of appointment, scheduled for 11/01/2012 at 2:30 PM.)           The results of significant diagnostics from this hospitalization (including imaging, microbiology, ancillary and laboratory) are listed below for reference.    Significant Diagnostic Studies: No results found.  Microbiology: Recent Results (from the past 240 hour(s))  VIRAL CULTURE VIRC     Status: Normal (Preliminary result)   Collection Time   10/03/12 10:15 PM      Component Value Range Status Comment   Specimen Description MOUTH   Final    Special Requests Normal   Final    Culture CONTINUING TO HOLD   Final    Report Status PENDING   Incomplete      Labs: Basic Metabolic Panel:  Lab 10/07/12 4540 10/06/12 0510 10/04/12 0450 10/02/12 1504  NA 142 142 135 136  K 3.4* 3.5 3.7 4.3  CL 107 105 100 97  CO2 29 26 27 25   GLUCOSE 113* 108* 99 85  BUN 11 8 9 16   CREATININE 1.23 1.06 0.93 1.02    CALCIUM 8.5 8.3* 9.0 9.7  MG -- -- -- --  PHOS -- -- -- --   Liver Function Tests:  Lab 10/07/12 0449 10/02/12 1504  AST 19 19  ALT 9 10  ALKPHOS 67 86  BILITOT 0.4 1.8*  PROT 6.9 8.8*  ALBUMIN 2.8* 3.8   No results found for this basename: LIPASE:5,AMYLASE:5 in the last 168 hours No results found for this basename: AMMONIA:5 in the last 168 hours CBC:  Lab 10/07/12 0449 10/06/12 0510 10/04/12 0450 10/02/12 1504  WBC 5.5 5.1 5.3 6.9  NEUTROABS -- -- -- 4.2  HGB 11.7* 12.0* 13.2 15.1  HCT 33.7* 34.7* 37.3* 41.7  MCV 89.4 89.0 88.2 88.5  PLT 277 245 225 214   Cardiac Enzymes: No results found for this basename: CKTOTAL:5,CKMB:5,CKMBINDEX:5,TROPONINI:5 in the last 168 hours BNP: BNP (last 3  results) No results found for this basename: PROBNP:3 in the last 8760 hours CBG: No results found for this basename: GLUCAP:5 in the last 168 hours     Signed:  Katera Rybka,Vasil  [facility] 10/07/2012, 5:27 PM

## 2012-10-07 NOTE — Progress Notes (Signed)
Patient discharge home with wife, alert and oriented, discharge instructions given, patient verbalize understanding of discharge orders given, patient with follow-up appointment with Dr at [facility], patient in stable condition at this time

## 2012-10-14 LAB — VIRAL CULTURE VIRC: Special Requests: NORMAL

## 2013-01-21 ENCOUNTER — Emergency Department (HOSPITAL_COMMUNITY)
Admission: EM | Admit: 2013-01-21 | Discharge: 2013-01-22 | Payer: Medicaid Other | Attending: Emergency Medicine | Admitting: Emergency Medicine

## 2013-01-21 ENCOUNTER — Encounter (HOSPITAL_COMMUNITY): Payer: Self-pay | Admitting: *Deleted

## 2013-01-21 DIAGNOSIS — Z8619 Personal history of other infectious and parasitic diseases: Secondary | ICD-10-CM | POA: Insufficient documentation

## 2013-01-21 DIAGNOSIS — F17200 Nicotine dependence, unspecified, uncomplicated: Secondary | ICD-10-CM | POA: Insufficient documentation

## 2013-01-21 DIAGNOSIS — K121 Other forms of stomatitis: Secondary | ICD-10-CM

## 2013-01-21 DIAGNOSIS — K1370 Unspecified lesions of oral mucosa: Secondary | ICD-10-CM | POA: Insufficient documentation

## 2013-01-21 DIAGNOSIS — Z8614 Personal history of Methicillin resistant Staphylococcus aureus infection: Secondary | ICD-10-CM | POA: Insufficient documentation

## 2013-01-21 LAB — CBC WITH DIFFERENTIAL/PLATELET
Basophils Absolute: 0 10*3/uL (ref 0.0–0.1)
HCT: 43.7 % (ref 39.0–52.0)
Lymphocytes Relative: 26 % (ref 12–46)
Neutro Abs: 4 10*3/uL (ref 1.7–7.7)
Platelets: 244 10*3/uL (ref 150–400)
RBC: 4.97 MIL/uL (ref 4.22–5.81)
RDW: 11.7 % (ref 11.5–15.5)
WBC: 6.7 10*3/uL (ref 4.0–10.5)

## 2013-01-21 LAB — COMPREHENSIVE METABOLIC PANEL
ALT: 15 U/L (ref 0–53)
AST: 23 U/L (ref 0–37)
Alkaline Phosphatase: 92 U/L (ref 39–117)
CO2: 24 mEq/L (ref 19–32)
Chloride: 98 mEq/L (ref 96–112)
GFR calc non Af Amer: >90 mL/min (ref >90)
Sodium: 136 mEq/L (ref 135–145)
Total Bilirubin: 0.8 mg/dL (ref 0.3–1.2)

## 2013-01-21 MED ORDER — LIDOCAINE VISCOUS 2 % MT SOLN
20.0000 mL | Freq: Once | OROMUCOSAL | Status: AC
  Administered 2013-01-21: 20 mL via OROMUCOSAL
  Filled 2013-01-21: qty 20

## 2013-01-21 MED ORDER — HYDROMORPHONE HCL PF 1 MG/ML IJ SOLN
1.0000 mg | Freq: Once | INTRAMUSCULAR | Status: AC
  Administered 2013-01-21: 1 mg via INTRAVENOUS
  Filled 2013-01-21: qty 1

## 2013-01-21 MED ORDER — SODIUM CHLORIDE 0.9 % IV BOLUS (SEPSIS)
1000.0000 mL | Freq: Once | INTRAVENOUS | Status: AC
  Administered 2013-01-21: 1000 mL via INTRAVENOUS

## 2013-01-21 MED ORDER — ONDANSETRON HCL 4 MG/2ML IJ SOLN
4.0000 mg | Freq: Once | INTRAMUSCULAR | Status: AC
  Administered 2013-01-21: 4 mg via INTRAVENOUS
  Filled 2013-01-21: qty 2

## 2013-01-21 NOTE — ED Notes (Signed)
Pt's wife states pt has a hx of mouth/throat sores, has been here twice and been tested for herpes told it wasn't that, has been admitted to hospital d/t not eating and drinking from pain, about a week ago developed same sore in mouth/throat, pt will not talk d/t pain.

## 2013-01-21 NOTE — ED Provider Notes (Signed)
History     CSN: [id number]  Arrival date & time 01/21/13  2057   First MD Initiated Contact with Patient 01/21/13 2146      Chief Complaint  Patient presents with  . Mouth Lesions    (Consider location/radiation/quality/duration/timing/severity/associated sxs/prior treatment) HPI Comments: 22yo African American male w a hx of recurrent ulcerative mouth lesions presents to the ER c/o an out break that began 10 days ago. Lesions are located on the roof of mouth, lips, and pt states they "feel like they are in my throat".  Pain worsened with eating and drinking causing pt to be anorexic x 2 days. No known alleviating factors. Patient denies fever, arthralgias, headache, dizziness, changes in vision or hearing, cough, sob, cp, n/v/d, changes in bowel / bladder habits, muscle or joint aches. Patient reports 2 previous outbreaks in September and December for which he was admitted to the hospital, diagnosed with hsv and prescribed acyclovir, percocet, and lidocaine. He states he has been adhering to his medication regiment and it has not seemed to decrease the frequency of outbreak. Pt was to f-u with ID at [facility], but was non compliant bc his symptoms resolved. Patient has hx of gonorrhea, chlyamdia, and trich within the last 12 months for which he was treated.   Patient is a 22 y.o. male presenting with mouth sores. The history is provided by the patient.  Mouth Lesions  Associated symptoms include mouth sores.    Past Medical History  Diagnosis Date  . Mouth ulcers   . MRSA (methicillin resistant staph aureus) culture positive   . Herpes     History reviewed. No pertinent past surgical history.  Family History  Problem Relation Age of Onset  . Heart disease Mother     History  Substance Use Topics  . Smoking status: Current Every Day Smoker -- 0.25 packs/day    Types: Cigarettes  . Smokeless tobacco: Never Used  . Alcohol Use: Yes     Comment: occ      Review of  Systems  HENT: Positive for mouth sores.   All other systems reviewed and are negative.    Allergies  Review of patient's allergies indicates no known allergies.  Home Medications   Current Outpatient Rx  Name  Route  Sig  Dispense  Refill  . valACYclovir (VALTREX) 500 MG tablet   Oral   Take 1 tablet (500 mg total) by mouth daily.   15 tablet   0     Please follow up with ID before the medication run ...     BP 132/85  Pulse 93  Temp(Src) 98.5 F (36.9 C) (Oral)  Resp 18  SpO2 100%  Physical Exam  Nursing note and vitals reviewed. Constitutional: He is oriented to person, place, and time. He appears well-developed and well-nourished. No distress.  Mumbled speech d/t pain from ulcers  HENT:  Head: Normocephalic and atraumatic.  Superficial ulcerations on gums, tongue, roof of mouth and tonsils.   Eyes: Conjunctivae and EOM are normal.  Neck: Normal range of motion.  Pulmonary/Chest: Effort normal.  Genitourinary:  Normal penis w no lesions.   Musculoskeletal: Normal range of motion.  Neurological: He is alert and oriented to person, place, and time.  Skin: Skin is warm and dry. No rash noted. He is not diaphoretic.  Psychiatric: He has a normal mood and affect. His behavior is normal.    ED Course  Procedures (including critical care time)  Labs Reviewed  CBC WITH  DIFFERENTIAL - Abnormal; Notable for the following:    Monocytes Relative 13 (*)    All other components within normal limits  COMPREHENSIVE METABOLIC PANEL - Abnormal; Notable for the following:    Total Protein 8.7 (*)    All other components within normal limits  URINALYSIS, ROUTINE W REFLEX MICROSCOPIC   No results found.   No diagnosis found.    MDM  Patient is a 22 year old male who presents emergency department for superficial ulceration oral outbreak.  Patient has been admitted and worked up for similar outbreaks and passed (9/29-10/4, 12/25-12/30). Workup at that time include  negative HIV antibodies, culture or and ANA.  Consultation to infectious disease recommended patient be worked up at [facility] for other possible etiology.  Patient denies any arthralgias, penile symptoms or family history of similar presentation. Pt is unable to eat or drink d/t pain. IVFs, steroids, pain medication & oral lidocaine given. Consult [facility] request the patient be transferred to [facility] this evening for further infectious disease evaluation.  Patient has been accepted by Dr. Eual Fines for admission.  Patient is aware of plan and agreeable.  The patient appears reasonably stabilized for admission considering the current resources, flow, and capabilities available in the ED at this time, and I doubt any other [facility] requiring further screening and/or treatment in the ED prior to admission.         Jaci Carrel, [place] 01/22/13 0131  Medical screening examination/treatment/procedure(s) were performed by non-physician practitioner and as supervising physician I was immediately available for consultation/collaboration.  Derwood Kaplan, MD 01/23/13 [phone]

## 2013-01-22 ENCOUNTER — Telehealth: Payer: Self-pay | Admitting: Family Medicine

## 2013-01-22 MED ORDER — METHYLPREDNISOLONE SODIUM SUCC 125 MG IJ SOLR
125.0000 mg | Freq: Once | INTRAMUSCULAR | Status: AC
  Administered 2013-01-22: 125 mg via INTRAVENOUS
  Filled 2013-01-22: qty 2

## 2013-01-22 NOTE — ED Notes (Signed)
[address] tower Room 15 Floor [phone]

## 2013-01-22 NOTE — ED Notes (Signed)
Pt being transported BLS by [facility]. [facility] aware. Report called to 8N.

## 2013-01-22 NOTE — Telephone Encounter (Signed)
Received call from emergency physician about patient with recurrent flare of HSV oral ulcers. Patient is noted to have had multiple admissions for this in the past with concurrent infectious disease consults. Last admission was in December 2013 with plan followup with [facility] for further management of this issue by [facility] Infectious disease recommendations. Per report, patient did not formally followup with [facility]. Had a relatively lengthy discussion with ED physician. We both agree that it would be prudent for patient to be transferred to [facility] for further management of this issue as the record indicates that there is no further options for management that can be offered here. ED physician will begin process to attempt transfer to [facility]. Will recontact if there are any issues.

## 2013-03-09 ENCOUNTER — Emergency Department (HOSPITAL_COMMUNITY)
Admission: EM | Admit: 2013-03-09 | Discharge: 2013-03-09 | Disposition: A | Discharge status: Home / Self Care | Payer: Medicaid Other | Attending: Emergency Medicine | Admitting: Emergency Medicine

## 2013-03-09 ENCOUNTER — Encounter (HOSPITAL_COMMUNITY): Payer: Self-pay | Admitting: *Deleted

## 2013-03-09 DIAGNOSIS — K120 Recurrent oral aphthae: Secondary | ICD-10-CM | POA: Insufficient documentation

## 2013-03-09 DIAGNOSIS — B009 Herpesviral infection, unspecified: Secondary | ICD-10-CM | POA: Insufficient documentation

## 2013-03-09 DIAGNOSIS — F17200 Nicotine dependence, unspecified, uncomplicated: Secondary | ICD-10-CM | POA: Insufficient documentation

## 2013-03-09 DIAGNOSIS — R000 Tachycardia, unspecified: Secondary | ICD-10-CM | POA: Insufficient documentation

## 2013-03-09 DIAGNOSIS — K121 Other forms of stomatitis: Secondary | ICD-10-CM

## 2013-03-09 DIAGNOSIS — Z8614 Personal history of Methicillin resistant Staphylococcus aureus infection: Secondary | ICD-10-CM | POA: Insufficient documentation

## 2013-03-09 LAB — BASIC METABOLIC PANEL
BUN: 14 mg/dL (ref 6–23)
GFR calc Af Amer: >90 mL/min (ref >90)
GFR calc non Af Amer: 88 mL/min — ABNORMAL LOW (ref >90)
Potassium: 4 mEq/L (ref 3.5–5.1)
Sodium: 135 mEq/L (ref 135–145)

## 2013-03-09 LAB — CBC WITH DIFFERENTIAL/PLATELET
Basophils Relative: 0 % (ref 0–1)
Eosinophils Absolute: 0.2 10*3/uL (ref 0.0–0.7)
MCH: 31.3 pg (ref 26.0–34.0)
MCHC: 35.5 g/dL (ref 30.0–36.0)
Monocytes Relative: 14 % — ABNORMAL HIGH (ref 3–12)
Neutrophils Relative %: 62 % (ref 43–77)
Platelets: 228 10*3/uL (ref 150–400)

## 2013-03-09 MED ORDER — SODIUM CHLORIDE 0.9 % IV BOLUS (SEPSIS)
1000.0000 mL | Freq: Once | INTRAVENOUS | Status: AC
  Administered 2013-03-09: 1000 mL via INTRAVENOUS

## 2013-03-09 MED ORDER — OXYCODONE-ACETAMINOPHEN 5-325 MG PO TABS: 1.0000 | ORAL_TABLET | Freq: Four times a day (QID) | ORAL | Status: DC | PRN

## 2013-03-09 MED ORDER — MORPHINE SULFATE 4 MG/ML IJ SOLN
4.0000 mg | Freq: Once | INTRAMUSCULAR | Status: AC
  Administered 2013-03-09: 4 mg via INTRAVENOUS
  Filled 2013-03-09: qty 1

## 2013-03-09 MED ORDER — OXYCODONE-ACETAMINOPHEN 5-325 MG PO TABS
2.0000 | ORAL_TABLET | Freq: Once | ORAL | Status: AC
  Administered 2013-03-09: 2 via ORAL
  Filled 2013-03-09: qty 2

## 2013-03-09 NOTE — ED Notes (Signed)
Pt has a hx of sores in his mouth, sores are back, pt not eating or drinking d/t pain, x 3 days.

## 2013-03-09 NOTE — ED Notes (Signed)
Thick white coating on tongue. Lips are dry and bloody.

## 2013-03-09 NOTE — ED Provider Notes (Signed)
History     CSN: [id number]  Arrival date & time 03/09/13  9528   First MD Initiated Contact with Patient 03/09/13 2001      Chief Complaint  Patient presents with  . 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 He has been evaluated at [facility] in April by ID, ENT and ophthalmology and after looking through records, diagnosis- Mouth ulcers Severe, recurrent aphthous stomatitis; aphthosis, etiology uncertain; no evidence of systemic illness. HIV, lupus, G6PD, Bechet among multiple other infectious and autoimmune diseases were ruled out. Had follow up last week and was prescribed mouthwash for the aphthosis which has viscous lidocaine which is not helping. Also on colchicine and valtrex. Sores are becoming so severe that he has not been able to eat or drink for the past three days. No fever or chills. Wife is giving history as it is too painful for patient to talk, however he does nod and shake his head to answer questions.  The history is provided by the spouse and the patient.    Past Medical History  Diagnosis Date  . Mouth ulcers   . MRSA (methicillin resistant staph aureus) culture positive   . Herpes     History reviewed. No pertinent past surgical history.  Family History  Problem Relation Age of Onset  . Heart disease Mother     History  Substance Use Topics  . Smoking status: Current Every Day Smoker -- 0.25 packs/day    Types: Cigarettes  . Smokeless tobacco: Never Used  . Alcohol Use: Yes     Comment: occ      Review of Systems  Constitutional: Positive for appetite change. Negative for fever and chills.  HENT: Positive for mouth sores. Negative for trouble swallowing.   All other systems reviewed and are negative.    Allergies  Review of  patient's allergies indicates no known allergies.  Home Medications   Current Outpatient Rx  Name  Route  Sig  Dispense  Refill  . lidocaine (XYLOCAINE) 2 % solution   Oral   Take 20 mLs by mouth as needed for pain.           BP 122/76  Pulse 102  Temp(Src) 98.6 F (37 C) (Oral)  Resp 20  SpO2 100%  Physical Exam  Nursing note and vitals reviewed. Constitutional: He is oriented to person, place, and time. He appears well-developed and well-nourished. No distress.  Not talking due to pain.  HENT:  Head: Normocephalic and atraumatic.  Nose: Nose normal.  Mouth/Throat: Uvula is midline.  Crusting, dryness across upper and lower lip with bleeding, pain to lips when opening mouth, thick white coating on tongue, superficial ulcerations on lower gingiva.  Eyes: Conjunctivae and EOM are normal.  Neck: Normal range of motion. Neck supple.  Cardiovascular: Regular rhythm and normal heart sounds.  Tachycardia present.   Pulmonary/Chest: Effort normal and breath sounds normal. No respiratory distress.  Musculoskeletal: Normal range of motion. He exhibits no edema.  Lymphadenopathy:    He has no cervical adenopathy.  Neurological: He is alert and oriented to person, place, and time.  Skin: Skin is warm and dry. No rash noted. He is not diaphoretic.  Psychiatric: He has a normal mood and affect. His behavior is normal.    ED Course  Procedures (  including critical care time)  Labs Reviewed  CBC WITH DIFFERENTIAL - Abnormal; Notable for the following:    Monocytes Relative 14 (*)    All other components within normal limits  BASIC METABOLIC PANEL - Abnormal; Notable for the following:    GFR calc non Af Amer 88 (*)    All other components within normal limits   No results found.   1. Stomatitis   2. HSV infection       MDM  22 y/o male with apthosis, pain severe at home he cannot eat. He is able to open his mouth, just with pain. IV fluids given, labs unremarkable. Pain  improved slightly with morphine, able to talk. Suggested admission if he cannot eat, however he would like to go home. Will give PO percocet and see if he can tolerate drinking and swallowing. Case discussed with Dr. Juleen China who also evaluated patient and agrees with plan of care. 11:12 PM Patient able to tolerate PO percocet, denies pain or difficulty with doing so. He feels ready to go home. Return precautions discussed. Rx for percocet. He will follow up with his specialist at [facility]. Patient states understanding of plan and is agreeable.   Trevor Mace, PA-C 03/09/13 2314

## 2013-03-10 NOTE — ED Provider Notes (Signed)
Medical screening examination/treatment/procedure(s) were conducted as a shared visit with non-physician practitioner(s) and myself.  I personally evaluated the patient during the encounter.  22 year old male with severe apthostomatitis. Symptomatic to the point currently that it is affecting his nutrition/hydration status. Patient was given IV fluids and pain medications. Patient's had an extensive workup of this including evaluation for possible autoimmune and other infectious etiologies. He is currently using a mouthwash with viscous lidocaine but is not adequately controlling his pain. Patient was given oral narcotics which she was able to tolerate in the emergency room. Given the severity of his symptoms though he was offered admission for further pain control and IV fluids. He is declining at this time would like to go home. I feel that this is reasonable at this time, but patient was instructed that he needs to return for further evaluation if he cannot tolerate adequate oral intake. Patient may ultimately need gastrostomy tube or other intervention if this continues to be significant ongoing issue.  Raeford Razor, MD 03/10/13 1537

## 2013-03-11 ENCOUNTER — Encounter (HOSPITAL_COMMUNITY): Payer: Self-pay | Admitting: *Deleted

## 2013-03-11 ENCOUNTER — Inpatient Hospital Stay (HOSPITAL_COMMUNITY)
Admission: EM | Admit: 2013-03-11 | Discharge: 2013-03-15 | DRG: 157 | Disposition: A | Discharge status: Home / Self Care | Payer: Medicaid Other | Attending: Internal Medicine | Admitting: Internal Medicine

## 2013-03-11 DIAGNOSIS — E43 Unspecified severe protein-calorie malnutrition: Secondary | ICD-10-CM | POA: Diagnosis present

## 2013-03-11 DIAGNOSIS — B009 Herpesviral infection, unspecified: Secondary | ICD-10-CM

## 2013-03-11 DIAGNOSIS — K120 Recurrent oral aphthae: Principal | ICD-10-CM | POA: Diagnosis present

## 2013-03-11 DIAGNOSIS — B9789 Other viral agents as the cause of diseases classified elsewhere: Secondary | ICD-10-CM

## 2013-03-11 DIAGNOSIS — F17200 Nicotine dependence, unspecified, uncomplicated: Secondary | ICD-10-CM | POA: Diagnosis present

## 2013-03-11 DIAGNOSIS — E876 Hypokalemia: Secondary | ICD-10-CM | POA: Diagnosis present

## 2013-03-11 DIAGNOSIS — K121 Other forms of stomatitis: Secondary | ICD-10-CM | POA: Diagnosis present

## 2013-03-11 DIAGNOSIS — IMO0002 Reserved for concepts with insufficient information to code with codable children: Secondary | ICD-10-CM

## 2013-03-11 DIAGNOSIS — B370 Candidal stomatitis: Secondary | ICD-10-CM

## 2013-03-11 DIAGNOSIS — K1230 Oral mucositis (ulcerative), unspecified: Secondary | ICD-10-CM

## 2013-03-11 DIAGNOSIS — E860 Dehydration: Secondary | ICD-10-CM | POA: Diagnosis present

## 2013-03-11 LAB — CBC WITH DIFFERENTIAL/PLATELET
Basophils Absolute: 0 10*3/uL (ref 0.0–0.1)
Basophils Relative: 0 % (ref 0–1)
Hemoglobin: 14.6 g/dL (ref 13.0–17.0)
MCHC: 35.9 g/dL (ref 30.0–36.0)
Monocytes Relative: 13 % — ABNORMAL HIGH (ref 3–12)
Neutro Abs: 4.1 10*3/uL (ref 1.7–7.7)
Neutrophils Relative %: 59 % (ref 43–77)
Platelets: 255 10*3/uL (ref 150–400)

## 2013-03-11 LAB — POCT I-STAT, CHEM 8
BUN: 14 mg/dL (ref 6–23)
Calcium, Ion: 1.03 mmol/L — ABNORMAL LOW (ref 1.12–1.23)
Chloride: 105 mEq/L (ref 96–112)
Glucose, Bld: 83 mg/dL (ref 70–99)
TCO2: 26 mmol/L (ref 0–100)

## 2013-03-11 MED ORDER — SODIUM CHLORIDE 0.9 % IV BOLUS (SEPSIS)
1000.0000 mL | Freq: Once | INTRAVENOUS | Status: AC
  Administered 2013-03-11: 1000 mL via INTRAVENOUS

## 2013-03-11 MED ORDER — HYDROMORPHONE HCL PF 1 MG/ML IJ SOLN
1.0000 mg | Freq: Once | INTRAMUSCULAR | Status: AC
  Administered 2013-03-11: 1 mg via INTRAVENOUS
  Filled 2013-03-11: qty 1

## 2013-03-11 MED ORDER — MAGIC MOUTHWASH
10.0000 mL | Freq: Once | ORAL | Status: AC
  Administered 2013-03-11: 10 mL via ORAL
  Filled 2013-03-11: qty 10

## 2013-03-11 NOTE — ED Notes (Signed)
Pt in c/o sores in his mouth x3 months with decreased PO intake recently, seen here recently for same. Refuses temperature in triage.

## 2013-03-11 NOTE — ED Provider Notes (Signed)
History     CSN: [id number]  Arrival date & time 03/11/13  2100   First MD Initiated Contact with Patient 03/11/13 2218      Chief Complaint  Patient presents with  . mouth sores     (Consider location/radiation/quality/duration/timing/severity/associated sxs/prior treatment) The history is provided by the patient.    Past Medical History  Diagnosis Date  . Mouth ulcers   . MRSA (methicillin resistant staph aureus) culture positive   . Herpes     History reviewed. No pertinent past surgical history.  Family History  Problem Relation Age of Onset  . Heart disease Mother     History  Substance Use Topics  . Smoking status: Current Every Day Smoker -- 0.25 packs/day    Types: Cigarettes  . Smokeless tobacco: Never Used  . Alcohol Use: Yes     Comment: occ      Review of Systems  Unable to perform ROS Constitutional: Positive for appetite change. Negative for fever and chills.  HENT: Positive for mouth sores and trouble swallowing. Negative for drooling.   Gastrointestinal: Negative for abdominal pain.  Genitourinary: Negative for decreased urine volume.  Neurological: Negative for dizziness and weakness.  All other systems reviewed and are negative.    Allergies  Review of patient's allergies indicates no known allergies.  Home Medications   Current Outpatient Rx  Name  Route  Sig  Dispense  Refill  . lidocaine (XYLOCAINE) 2 % solution   Oral   Take 20 mLs by mouth as needed for pain.         Marland Kitchen oxyCODONE-acetaminophen (PERCOCET) 5-325 MG per tablet   Oral   Take 1-2 tablets by mouth every 6 (six) hours as needed for pain.   20 tablet   0     BP 126/89  Pulse 91  Resp 20  SpO2 100%  Physical Exam  Nursing note and vitals reviewed. Constitutional: He is oriented to person, place, and time. He appears well-developed. No distress.  HENT:  Head: Normocephalic.  Mouth/Throat: Mucous membranes are dry. Oropharyngeal exudate and posterior  oropharyngeal erythema present.  Mouth ulcers lips dry and cracked tongue coated with white   Neck: Normal range of motion.  Cardiovascular: Normal rate.   Pulmonary/Chest: Effort normal.  Lymphadenopathy:    He has no cervical adenopathy.  Neurological: He is alert and oriented to person, place, and time.  Skin: Skin is warm. He is not diaphoretic.    ED Course  Procedures (including critical care time)  Labs Reviewed  CBC WITH DIFFERENTIAL - Abnormal; Notable for the following:    Monocytes Relative 13 (*)    All other components within normal limits  POCT I-STAT, CHEM 8 - Abnormal; Notable for the following:    Calcium, Ion 1.03 (*)    All other components within normal limits   No results found.   1. Stomatitis and mucositis       MDM  Will admit for pain control and hydration         Arman Filter, NP 03/12/13 [phone]

## 2013-03-12 ENCOUNTER — Encounter (HOSPITAL_COMMUNITY): Payer: Self-pay | Admitting: *Deleted

## 2013-03-12 DIAGNOSIS — K121 Other forms of stomatitis: Secondary | ICD-10-CM

## 2013-03-12 DIAGNOSIS — E43 Unspecified severe protein-calorie malnutrition: Secondary | ICD-10-CM | POA: Diagnosis present

## 2013-03-12 DIAGNOSIS — K1370 Unspecified lesions of oral mucosa: Secondary | ICD-10-CM

## 2013-03-12 DIAGNOSIS — E876 Hypokalemia: Secondary | ICD-10-CM | POA: Diagnosis not present

## 2013-03-12 LAB — COMPREHENSIVE METABOLIC PANEL
ALT: 9 U/L (ref 0–53)
BUN: 11 mg/dL (ref 6–23)
Calcium: 8.8 mg/dL (ref 8.4–10.5)
Chloride: 99 mEq/L (ref 96–112)
Creatinine, Ser: 0.99 mg/dL (ref 0.50–1.35)
GFR calc Af Amer: >90 mL/min (ref >90)
GFR calc non Af Amer: >90 mL/min (ref >90)
Potassium: 3.3 mEq/L — ABNORMAL LOW (ref 3.5–5.1)
Sodium: 136 mEq/L (ref 135–145)
Total Protein: 7.7 g/dL (ref 6.0–8.3)

## 2013-03-12 LAB — HIV ANTIBODY (ROUTINE TESTING W REFLEX): HIV: NONREACTIVE

## 2013-03-12 LAB — CBC
Hemoglobin: 12.3 g/dL — ABNORMAL LOW (ref 13.0–17.0)
MCHC: 34.5 g/dL (ref 30.0–36.0)
RBC: 4.09 MIL/uL — ABNORMAL LOW (ref 4.22–5.81)
WBC: 6.4 10*3/uL (ref 4.0–10.5)

## 2013-03-12 LAB — VITAMIN B12: Vitamin B-12: 721 pg/mL (ref 211–911)

## 2013-03-12 MED ORDER — HYDROMORPHONE HCL PF 1 MG/ML IJ SOLN
1.0000 mg | INTRAMUSCULAR | Status: DC | PRN
  Administered 2013-03-12: 1 mg via INTRAVENOUS
  Filled 2013-03-12: qty 1

## 2013-03-12 MED ORDER — ONDANSETRON HCL 4 MG/2ML IJ SOLN: 4.0000 mg | Freq: Three times a day (TID) | INTRAMUSCULAR | Status: AC | PRN

## 2013-03-12 MED ORDER — ENOXAPARIN SODIUM 40 MG/0.4ML ~~LOC~~ SOLN
40.0000 mg | SUBCUTANEOUS | Status: DC
  Administered 2013-03-12 – 2013-03-13 (×2): 40 mg via SUBCUTANEOUS
  Filled 2013-03-12 (×5): qty 0.4

## 2013-03-12 MED ORDER — POTASSIUM CHLORIDE 20 MEQ/15ML (10%) PO LIQD
40.0000 meq | Freq: Once | ORAL | Status: DC
  Filled 2013-03-12: qty 30

## 2013-03-12 MED ORDER — HYDROMORPHONE HCL PF 1 MG/ML IJ SOLN
1.0000 mg | Freq: Once | INTRAMUSCULAR | Status: AC
  Administered 2013-03-12: 1 mg via INTRAVENOUS
  Filled 2013-03-12: qty 1

## 2013-03-12 MED ORDER — DEXTROSE 5 % IV SOLN
10.0000 mg/kg | Freq: Three times a day (TID) | INTRAVENOUS | Status: DC
  Administered 2013-03-12: 670 mg via INTRAVENOUS
  Filled 2013-03-12 (×3): qty 13.4

## 2013-03-12 MED ORDER — HYDROCODONE-ACETAMINOPHEN 7.5-325 MG/15ML PO SOLN
10.0000 mL | ORAL | Status: DC | PRN
Start: 2013-03-12 — End: 2013-03-13
  Filled 2013-03-12: qty 15

## 2013-03-12 MED ORDER — COLCHICINE 0.6 MG PO TABS
0.6000 mg | ORAL_TABLET | Freq: Two times a day (BID) | ORAL | Status: DC
  Administered 2013-03-13 – 2013-03-15 (×5): 0.6 mg via ORAL
  Filled 2013-03-12 (×8): qty 1

## 2013-03-12 MED ORDER — GUAIFENESIN-DM 100-10 MG/5ML PO SYRP: 5.0000 mL | ORAL_SOLUTION | ORAL | Status: DC | PRN

## 2013-03-12 MED ORDER — ONDANSETRON HCL 4 MG/2ML IJ SOLN: 4.0000 mg | Freq: Four times a day (QID) | INTRAMUSCULAR | Status: DC | PRN

## 2013-03-12 MED ORDER — POTASSIUM CHLORIDE CRYS ER 20 MEQ PO TBCR
40.0000 meq | EXTENDED_RELEASE_TABLET | Freq: Once | ORAL | Status: AC
  Administered 2013-03-12: 40 meq via ORAL
  Filled 2013-03-12: qty 2

## 2013-03-12 MED ORDER — ACETAMINOPHEN 325 MG PO TABS: 650.0000 mg | ORAL_TABLET | Freq: Four times a day (QID) | ORAL | Status: DC | PRN

## 2013-03-12 MED ORDER — ACETAMINOPHEN 650 MG RE SUPP: 650.0000 mg | Freq: Four times a day (QID) | RECTAL | Status: DC | PRN

## 2013-03-12 MED ORDER — HYDROMORPHONE HCL PF 1 MG/ML IJ SOLN
1.0000 mg | INTRAMUSCULAR | Status: AC | PRN
  Administered 2013-03-12 (×2): 1 mg via INTRAVENOUS
  Filled 2013-03-12 (×2): qty 1

## 2013-03-12 MED ORDER — SODIUM CHLORIDE 0.45 % IV SOLN: INTRAVENOUS | Status: DC

## 2013-03-12 MED ORDER — MAGIC MOUTHWASH
10.0000 mL | ORAL | Status: DC | PRN
  Administered 2013-03-13: 10 mL via ORAL
  Filled 2013-03-12: qty 10

## 2013-03-12 MED ORDER — SODIUM CHLORIDE 0.9 % IV SOLN
INTRAVENOUS | Status: DC
  Administered 2013-03-12 – 2013-03-14 (×6): via INTRAVENOUS

## 2013-03-12 MED ORDER — MAGIC MOUTHWASH
10.0000 mL | Freq: Three times a day (TID) | ORAL | Status: DC | PRN
  Filled 2013-03-12: qty 10

## 2013-03-12 MED ORDER — ONDANSETRON HCL 4 MG PO TABS: 4.0000 mg | ORAL_TABLET | Freq: Four times a day (QID) | ORAL | Status: DC | PRN

## 2013-03-12 MED ORDER — LIDOCAINE VISCOUS 2 % MT SOLN
20.0000 mL | OROMUCOSAL | Status: DC | PRN
  Filled 2013-03-12: qty 20

## 2013-03-12 MED ORDER — FLUCONAZOLE IN SODIUM CHLORIDE 400-0.9 MG/200ML-% IV SOLN
400.0000 mg | INTRAVENOUS | Status: DC
  Administered 2013-03-12 – 2013-03-14 (×3): 400 mg via INTRAVENOUS
  Filled 2013-03-12 (×4): qty 200

## 2013-03-12 MED ORDER — POTASSIUM CHLORIDE CRYS ER 20 MEQ PO TBCR
40.0000 meq | EXTENDED_RELEASE_TABLET | Freq: Once | ORAL | Status: DC
  Filled 2013-03-12: qty 2

## 2013-03-12 NOTE — H&P (Signed)
[facility] History and Physical  Paul House UEA:[id number] DOB: 12/22/1990 DOA: 03/11/2013  Referring physician:  PCP: Default, Provider, MD   Chief Complaint: Sores in the mouth  HPI:  22 year old male with a history of breath emanated each as the infection, followed by infectious disease at [facility] who comes in with oral ulcerations erythema, inability to eat anything and dehydration. He was hospitalized for the same last year in December.Patient has a known history of recurrent HSV infection, since age 51 years, and is on prophylactic Acyclovir. Apparently, he gets about 3 episodes per year. He supposed to be an acyclovir prophylaxis. During his last admission Herpes Simplex virus 1 & 2 DNA by PCR was negative, and ANA was negative. Patient has had immunologic workup in September and October of 2013, all of which were non-revealing. HIV test was negative in 11/2011. Etiology of his lesions, is therefore still unclear, . Had follow up last week and was prescribed mouthwash for the aphthosis which has viscous lidocaine which is not helping. Also on colchicine and valtrex. Sores are becoming so severe that he has not been able to eat or drink for the past three days. No fever or chills. Wife is giving history as it is too painful for patient to talk He has been evaluated at [facility] in April by ID, ENT and ophthalmology and after looking through records, diagnosis- Mouth ulcers Severe, recurrent aphthous stomatitis; aphthosis, etiology uncertain; no evidence of systemic illness. HIV, lupus, G6PD, Bechet among multiple other infectious and autoimmune diseases were ruled out       Review of Systems: negative for the following  Constitutional: Denies fever, chills, diaphoresis, appetite change and fatigue.  HEENT: Denies photophobia, eye pain, redness, hearing loss, ear pain, congestion, positive for sore throat, rhinorrhea, sneezing, positive for mouth sores, trouble  swallowing, neck pain, neck stiffness and tinnitus.  Respiratory: Denies SOB, DOE, cough, chest tightness, and wheezing.  Cardiovascular: Denies chest pain, palpitations and leg swelling.  Gastrointestinal: Denies nausea, vomiting, abdominal pain, diarrhea, constipation, blood in stool and abdominal distention.  Genitourinary: Denies dysuria, urgency, frequency, hematuria, flank pain and difficulty urinating.  Musculoskeletal: Denies myalgias, back pain, joint swelling, arthralgias and gait problem.  Skin: Denies pallor, rash and wound.  Neurological: Denies dizziness, seizures, syncope, weakness, light-headedness, numbness and headaches.  Hematological: Denies adenopathy. Easy bruising, personal or family bleeding history  Psychiatric/Behavioral: Denies suicidal ideation, mood changes, confusion, nervousness, sleep disturbance and agitation       Past Medical History  Diagnosis Date  . Mouth ulcers   . MRSA (methicillin resistant staph aureus) culture positive   . Herpes      History reviewed. No pertinent past surgical history.    Social History:  reports that he has been smoking Cigarettes.  He has been smoking about 0.25 packs per day. He has never used smokeless tobacco. He reports that  drinks alcohol. He reports that he does not use illicit drugs.    No Known Allergies  Family History  Problem Relation Age of Onset  . Heart disease Mother      Prior to Admission medications   Medication Sig Start Date End Date Taking? Authorizing Provider  lidocaine (XYLOCAINE) 2 % solution Take 20 mLs by mouth as needed for pain.   Yes Historical Provider, MD  oxyCODONE-acetaminophen (PERCOCET) 5-325 MG per tablet Take 1-2 tablets by mouth every 6 (six) hours as needed for pain. 03/09/13  Yes Trevor Mace, PA-C     Physical Exam:  Filed Vitals:   03/11/13 2109  BP: 126/89  Pulse: 91  Resp: 20  SpO2: 100%     Constitutional: Vital signs reviewed. Patient is a well-developed  and well-nourished in no acute distress and cooperative with exam. Alert and oriented x3.  Head: Normocephalic and atraumatic  Ear: TM normal bilaterally  Mouth: no erythema or exudates, MMM  Eyes: PERRL, EOMI, conjunctivae normal, No scleral icterus.  Neck: Supple, Trachea midline normal ROM, No JVD, mass, thyromegaly, or carotid bruit present.  Cardiovascular: RRR, S1 normal, S2 normal, no MRG, pulses symmetric and intact bilaterally  Pulmonary/Chest: CTAB, no wheezes, rales, or rhonchi  Abdominal: Soft. Non-tender, non-distended, bowel sounds are normal, no masses, organomegaly, or guarding present.  GU: no CVA tenderness Musculoskeletal: No joint deformities, erythema, or stiffness, ROM full and no nontender Ext: no edema and no cyanosis, pulses palpable bilaterally (DP and PT)  Hematology: no cervical, inginal, or axillary adenopathy.  Neurological: A&O x3, Strenght is normal and symmetric bilaterally, cranial nerve II-XII are grossly intact, no focal motor deficit, sensory intact to light touch bilaterally.  Skin: Warm, dry and intact. No rash, cyanosis, or clubbing.  Psychiatric: Normal mood and affect. speech and behavior is normal. Judgment and thought content normal. Cognition and memory are normal.       Labs on Admission:    Basic Metabolic Panel:  Recent Labs Lab 03/09/13 2115 03/11/13 2322  NA 135 138  K 4.0 4.7  CL 96 105  CO2 25  --   GLUCOSE 87 83  BUN 14 14  CREATININE 1.15 0.90  CALCIUM 10.1  --    Liver Function Tests: No results found for this basename: AST, ALT, ALKPHOS, BILITOT, PROT, ALBUMIN,  in the last 168 hours No results found for this basename: LIPASE, AMYLASE,  in the last 168 hours No results found for this basename: AMMONIA,  in the last 168 hours CBC:  Recent Labs Lab 03/09/13 2115 03/11/13 2320 03/11/13 2322  WBC 6.6 6.9  --   NEUTROABS 4.1 4.1  --   HGB 16.1 14.6 15.6  HCT 45.4 40.7 46.0  MCV 88.2 87.5  --   PLT 228 255  --     Cardiac Enzymes: No results found for this basename: CKTOTAL, CKMB, CKMBINDEX, TROPONINI,  in the last 168 hours  BNP (last 3 results) No results found for this basename: PROBNP,  in the last 8760 hours    CBG: No results found for this basename: GLUCAP,  in the last 168 hours  Radiological Exams on Admission: No results found.  EKG: Independently reviewed.   Assessment/Plan Principal Problem:   Stomatitis, viral Active Problems:   Herpes simplex infection   Stomatitis We'll start the patient on IV acyclovir Continue Magic mouthwash He received IV Solu-Medrol during his last admission? Will consult infectious disease in the morning  Will repeat HIV PCR   Code Status:   full Family Communication: bedside Disposition Plan: admit   Time spent: 70 mins   [facility] Pager [phone]  If 7PM-7AM, please contact night-coverage [email or web address] Password [email or web address] 03/12/2013, 12:50 AM

## 2013-03-12 NOTE — Progress Notes (Signed)
[facility] PROGRESS NOTE  Paul House ZOX:[id number] DOB: 07-26-1991 DOA: 03/11/2013 PCP: Default, Provider, MD  Assessment/Plan: Acute on chronic  Stomatitis No clear etiology. Reportedly patient follows with ID and has been seen by ENT and opthalmology at [facility]. He was admitted in dec 2013 for similar complains and ruled out for HSV DNA by PCR and negative cx on tzank smear.  Continue IV acyclovir q8hr Prn viscous lidocaine and magic mouth wash ID consulted. HIV PCR repeated. Clear liquids as tolerated.  Code Status: full Family Communication: none Disposition Plan:home once improved   Consultants:  ID   Procedures:  none  Antibiotics:  IV acyclovir  HPI/Subjective: Paul House has mouth pain and difficulty swallowing . Able to take sips only  Objective: Filed Vitals:   03/12/13 0140 03/12/13 0141 03/12/13 0215 03/12/13 0500  BP:   124/72 105/53  Pulse:   96 97  Temp:   98.4 F (36.9 C) 98.5 F (36.9 C)  TempSrc:   Axillary Axillary  Resp:   18 20  Height:  6' (1.829 m) 6' (1.829 m)   Weight: 67.189 kg (148 lb 2 oz)  67.132 kg (148 lb)   SpO2:   100% 97%    Intake/Output Summary (Last 24 hours) at 03/12/13 0950 Last data filed at 03/12/13 0600  Gross per 24 hour  Intake 481.73 ml  Output      0 ml  Net 481.73 ml   Filed Weights   03/12/13 0140 03/12/13 0215  Weight: 67.189 kg (148 lb 2 oz) 67.132 kg (148 lb)    Exam:   General:  Young male lying in bed in NAD  HEENT: no pallor, crusted ulcers over lower lips, foul breath deeply coated tongue, dry mucosa, no obvious ulceration noted  Cardiovascular: N S1&S2, no murmurs  Respiratory: clear b/l, no added sounds  Abdomen: soft, NT, ND, BS+  Musculoskeletal: warm, no edema  CNS AAOX3  Data Reviewed: Basic Metabolic Panel:  Recent Labs Lab 03/09/13 2115 03/11/13 2322 03/12/13 0345  NA 135 138 136  K 4.0 4.7 3.3*  CL 96 105 99  CO2 25  --  24  GLUCOSE 87 83 92  BUN 14 14  11   CREATININE 1.15 0.90 0.99  CALCIUM 10.1  --  8.8   Liver Function Tests:  Recent Labs Lab 03/12/13 0345  AST 17  ALT 9  ALKPHOS 83  BILITOT 0.7  PROT 7.7  ALBUMIN 3.4*   No results found for this basename: LIPASE, AMYLASE,  in the last 168 hours No results found for this basename: AMMONIA,  in the last 168 hours CBC:  Recent Labs Lab 03/09/13 2115 03/11/13 2320 03/11/13 2322 03/12/13 0345  WBC 6.6 6.9  --  6.4  NEUTROABS 4.1 4.1  --   --   HGB 16.1 14.6 15.6 12.3*  HCT 45.4 40.7 46.0 35.7*  MCV 88.2 87.5  --  87.3  PLT 228 255  --  186   Cardiac Enzymes: No results found for this basename: CKTOTAL, CKMB, CKMBINDEX, TROPONINI,  in the last 168 hours BNP (last 3 results) No results found for this basename: PROBNP,  in the last 8760 hours CBG: No results found for this basename: GLUCAP,  in the last 168 hours  Recent Results (from the past 240 hour(s))  MRSA PCR SCREENING     Status: None   Collection Time    03/12/13  2:15 AM      Result Value Range Status   MRSA by  PCR NEGATIVE  NEGATIVE Final   Comment:            The GeneXpert MRSA Assay (FDA     approved for NASAL specimens     only), is one component of a     comprehensive MRSA colonization     surveillance program. It is not     intended to diagnose MRSA     infection nor to guide or     monitor treatment for     MRSA infections.     Studies: No results found.  Scheduled Meds: . acyclovir  10 mg/kg Intravenous Q8H  . enoxaparin (LOVENOX) injection  40 mg Subcutaneous Q24H   Continuous Infusions: . sodium chloride 100 mL/hr at 03/12/13 0219      Time spent: 25 minutes    Yerania Chamorro  [facility] Pager [phone] If 7PM-7AM, please contact night-coverage at [email or web address], password [facility] 03/12/2013, 9:50 AM  LOS: 1 day

## 2013-03-12 NOTE — Progress Notes (Signed)
INITIAL NUTRITION ASSESSMENT  Pt meets criteria for severe MALNUTRITION in the context of acute illness as evidenced by <50% estimated energy intake in the past 5 days with 5.7% weight loss in the past week per pt report.  DOCUMENTATION CODES Per approved criteria  -Severe malnutrition in the context of acute illness or injury   INTERVENTION: - Diet advancement per MD - Pain medications per MD - Discussed recommended foods/supplements for mouth sores - Will continue to monitor   NUTRITION DIAGNOSIS: Inadequate oral intake related to mouth pain, clear liquid diet as evidenced by pt report, diet order.   Goal: 1. Resolution of mouth sores and pain 2. Advance diet as tolerated to regular diet  Monitor:  Weights, labs, diet advancement, mouth pain  Reason for Assessment: Nutrition risk   22 y.o. male  Admitting Dx: Stomatitis, viral  ASSESSMENT: Pt with mouth sores from herpes simplex infection which he gets 3 times/year per H&P. Pt known to RD from previous admission. Pt did not open mouth during visit but communicated using fingers and noises. Pt did not eat or drink for 3 days PTA. Pt reports 9 pound unintended weight loss in the past week. Pt reports his pain is better today but does not want to eat anything.   Height: Ht Readings from Last 1 Encounters:  03/12/13 6' (1.829 m)    Weight: Wt Readings from Last 1 Encounters:  03/12/13 148 lb (67.132 kg)    Ideal Body Weight: 178 lb  % Ideal Body Weight: 83  Wt Readings from Last 10 Encounters:  03/12/13 148 lb (67.132 kg)  10/02/12 145 lb 11.2 oz (66.089 kg)  07/08/12 135 lb 2.3 oz (61.3 kg)  07/03/12 150 lb (68.04 kg)  04/17/12 150 lb (68.04 kg)  03/08/12 140 lb (63.504 kg)  03/02/12 155 lb (70.308 kg)    Usual Body Weight: 150 lb  % Usual Body Weight: 99  BMI:  Body mass index is 20.07 kg/(m^2).  Estimated Nutritional Needs: Kcal: 2000-2350 Protein: 100-120g Fluid: 2-2.3L/day  Skin: Intact   Diet  Order: Clear Liquid  EDUCATION NEEDS: -No education needs identified at this time   Intake/Output Summary (Last 24 hours) at 03/12/13 1132 Last data filed at 03/12/13 0900  Gross per 24 hour  Intake 481.73 ml  Output      0 ml  Net 481.73 ml    Last BM: 6/1  Labs:   Recent Labs Lab 03/09/13 2115 03/11/13 2322 03/12/13 0345  NA 135 138 136  K 4.0 4.7 3.3*  CL 96 105 99  CO2 25  --  24  BUN 14 14 11   CREATININE 1.15 0.90 0.99  CALCIUM 10.1  --  8.8  GLUCOSE 87 83 92    CBG (last 3)  No results found for this basename: GLUCAP,  in the last 72 hours  Scheduled Meds: . acyclovir  10 mg/kg Intravenous Q8H  . enoxaparin (LOVENOX) injection  40 mg Subcutaneous Q24H    Continuous Infusions: . sodium chloride 100 mL/hr at 03/12/13 0219    Past Medical History  Diagnosis Date  . Mouth ulcers   . MRSA (methicillin resistant staph aureus) culture positive   . Herpes     History reviewed. No pertinent past surgical history.    Levon Hedger MS, RD, LDN [phone] Pager [phone] After Hours Pager

## 2013-03-12 NOTE — Consult Note (Signed)
[facility]    Date of Admission:  03/11/2013  Date of Consult:  03/12/2013  Reason for Consult: Severe oral ulcers Referring Physician: Dr. Gonzella Lex   HPI: Paul House is an 22 y.o. male with past medical history significant for recurrent oral ulcers that have been happening since the age of 60.  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 Despite treatment with IV acyclovir and a highly bioavailable valacyclovir and other antiviral agents this has continued to recur and to become increasingly severe in the face of antiviral therapy. The patient has had multiple admissions to [place] including September and December when he was seen by my partner Dr. Ninetta Lights. He is also been worked up extensively at [facility] most recently having been an inpatient bear on April 16 are April 19. 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 Furthermore he has had biopsy performed from lesions which was negative on TZanck smear and negative for viral cytpathic effect. As an inpatient in the past he was actually treated with HIGH dose SOLUMEDROL along with ARV and appeared to have improved.  During his most recent admission to [facility] he was seen not only by infectious disease and Dr. Polly Cobia but also by ophthalmology and dermatology. Ophthalmology excluded any evidence of uveitis. He was also seen by ear nose and throat surgery at [facility] they felt that repeat biopsy was not helpful. Ultimately was felt the diagnosis was most likely one of the following   #1 Complex aphthous mucositis (high on differential but will be a diagnosis of exclusion)  #2  IBD #3  Behcet  #4 vasculitis  It should be noted his ESR was  normal at 12, CRP was up. ANA negative. He was treated with valtrex, fluconazole and viscous lidocaine.  He was seen by Dr Charmian Muff at [facility] ID who fel this was HIGHLY UNLIKELY to be of viral origin and more likely a  "Severe, recurrent aphthous stomatitis of uncertain etiology . He rx colchicine but I am not certain pt ever took this. Dapsone was also being considered but pts G6pd was LOW.   Ulcerations worsened again and he was admitted yesterday with severe stomatitis and difficulty swallowing food. He was again placed onhat IV acyclovir.  Today on exam the patient has he is a white coating on his tong and mouth as well as severe alterations including the corner of his right lip. None of this seems to be responding to his current therapy and none of it looks very compelling for herpes simplex infection.  We are reconsulted to assist in the management and workup of this patient's recurrent mouth ulcers. Note that he is tested negative for HIV by antibody testing and has had prior infections with gonorrhea Chlamydia and Trichomonas or unprotected sexual intercourse.          Past Medical History  Diagnosis Date  . Mouth ulcers   . MRSA (methicillin resistant staph aureus) culture positive   . Herpes     History reviewed. No pertinent past surgical history.ergies:   No Known Allergies   Medications: I have reviewed patients current medications as documented in Epic Anti-infectives   Start     Dose/Rate Route Frequency Ordered Stop  03/12/13 1500  fluconazole (DIFLUCAN) IVPB 400 mg     400 mg 100 mL/hr over 120 Minutes Intravenous Every 24 hours 03/12/13 1409     03/12/13 0300  acyclovir (ZOVIRAX) 670 mg in dextrose 5 % 100 mL IVPB  Status:  Discontinued     10 mg/kg  67.2 kg 113.4 mL/hr over 60 Minutes Intravenous 3 times per day 03/12/13 0145 03/12/13 1613      Social History:  reports that he has been smoking Cigarettes.  He has been smoking about 0.25 packs per day. He has  never used smokeless tobacco. He reports that  drinks alcohol. He reports that he does not use illicit drugs.  Family History  Problem Relation Age of Onset  . Heart disease Mother     As in HPI and primary teams notes otherwise 12 point review of systems is negative  Blood pressure 115/56, pulse 87, temperature 98.3 F (36.8 C), temperature source Axillary, resp. rate 18, height 6' (1.829 m), weight 148 lb (67.132 kg), SpO2 96.00%. General:  somnolent but arousable and oriented x3, not in any acute distress. HEENT: anicteric sclera, pupils reactive to light and accommodation, EOMI, oropharynx with heat coated material on tong and top of palate. He is ulcerations as well in his mouth and a severe one in the corner of his right lip or he also has edema. His speech is difficult to understand.  CVS regular rate, normal r,  no murmur rubs or gallops Chest: clear to auscultation bilaterally, no wheezing, rales or rhonchi Abdomen: soft nontender, nondistended, normal bowel sounds, Extremities: no  clubbing or edema noted bilaterally Skin: no rashes Neuro: nonfocal, strength and sensation intact   Results for orders placed during the hospital encounter of 03/11/13 (from the past 48 hour(s))  CBC WITH DIFFERENTIAL     Status: Abnormal   Collection Time    03/11/13 11:20 PM      Result Value Range   WBC 6.9  4.0 - 10.5 K/uL   RBC 4.65  4.22 - 5.81 MIL/uL   Hemoglobin 14.6  13.0 - 17.0 g/dL   HCT 14.7  82.9 - 56.2 %   MCV 87.5  78.0 - 100.0 fL   MCH 31.4  26.0 - 34.0 pg   MCHC 35.9  30.0 - 36.0 g/dL   RDW 13.0  86.5 - 78.4 %   Platelets 255  150 - 400 K/uL   Neutrophils Relative % 59  43 - 77 %   Neutro Abs 4.1  1.7 - 7.7 K/uL   Lymphocytes Relative 24  12 - 46 %   Lymphs Abs 1.7  0.7 - 4.0 K/uL   Monocytes Relative 13 (*) 3 - 12 %   Monocytes Absolute 0.9  0.1 - 1.0 K/uL   Eosinophils Relative 4  0 - 5 %   Eosinophils Absolute 0.3  0.0 - 0.7 K/uL   Basophils Relative 0  0 - 1 %    Basophils Absolute 0.0  0.0 - 0.1 K/uL  POCT I-STAT, CHEM 8     Status: Abnormal   Collection Time    03/11/13 11:22 PM      Result Value Range   Sodium 138  135 - 145 mEq/L   Potassium 4.7  3.5 - 5.1 mEq/L   Chloride 105  96 - 112 mEq/L   BUN 14  6 - 23 mg/dL   Creatinine, Ser 6.96  0.50 - 1.35 mg/dL   Glucose, Bld 83  70 - 99  mg/dL   Calcium, Ion 1.61 (*) 1.12 - 1.23 mmol/L   TCO2 26  0 - 100 mmol/L   Hemoglobin 15.6  13.0 - 17.0 g/dL   HCT 09.6  04.5 - 40.9 %  HIV ANTIBODY (ROUTINE TESTING)     Status: None   Collection Time    03/12/13  1:15 AM      Result Value Range   HIV NON REACTIVE  NON REACTIVE  MRSA PCR SCREENING     Status: None   Collection Time    03/12/13  2:15 AM      Result Value Range   MRSA by PCR NEGATIVE  NEGATIVE   Comment:            The GeneXpert MRSA Assay (FDA     approved for NASAL specimens     only), is one component of a     comprehensive MRSA colonization     surveillance program. It is not     intended to diagnose MRSA     infection nor to guide or     monitor treatment for     MRSA infections.  COMPREHENSIVE METABOLIC PANEL     Status: Abnormal   Collection Time    03/12/13  3:45 AM      Result Value Range   Sodium 136  135 - 145 mEq/L   Potassium 3.3 (*) 3.5 - 5.1 mEq/L   Comment: DELTA CHECK NOTED     REPEATED TO VERIFY   Chloride 99  96 - 112 mEq/L   CO2 24  19 - 32 mEq/L   Glucose, Bld 92  70 - 99 mg/dL   BUN 11  6 - 23 mg/dL   Creatinine, Ser 8.11  0.50 - 1.35 mg/dL   Calcium 8.8  8.4 - 91.4 mg/dL   Total Protein 7.7  6.0 - 8.3 g/dL   Albumin 3.4 (*) 3.5 - 5.2 g/dL   AST 17  0 - 37 U/L   ALT 9  0 - 53 U/L   Alkaline Phosphatase 83  39 - 117 U/L   Total Bilirubin 0.7  0.3 - 1.2 mg/dL   GFR calc non Af Amer >90  >90 mL/min   GFR calc Af Amer >90  >90 mL/min   Comment:            The eGFR has been calculated     using the CKD EPI equation.     This calculation has not been     validated in all clinical     situations.      eGFR's persistently     <90 mL/min signify     possible Chronic Kidney Disease.  CBC     Status: Abnormal   Collection Time    03/12/13  3:45 AM      Result Value Range   WBC 6.4  4.0 - 10.5 K/uL   RBC 4.09 (*) 4.22 - 5.81 MIL/uL   Hemoglobin 12.3 (*) 13.0 - 17.0 g/dL   Comment: REPEATED TO VERIFY     DELTA CHECK NOTED   HCT 35.7 (*) 39.0 - 52.0 %   MCV 87.3  78.0 - 100.0 fL   MCH 30.1  26.0 - 34.0 pg   MCHC 34.5  30.0 - 36.0 g/dL   RDW 78.2  95.6 - 21.3 %   Platelets 186  150 - 400 K/uL   Comment: REPEATED TO VERIFY     DELTA CHECK NOTED  Component Value Date/Time   SDES MOUTH 10/03/2012 2215   SPECREQUEST Normal 10/03/2012 2215   CULT  Value: No Virus Isolated in Cell Culture                                                                A negative result does not exclude the possibility of virus infection;inappropriate specimen collection,storage and transport may lead to false negative  culture results. 10/03/2012 2215   REPTSTATUS 10/14/2012 FINAL 10/03/2012 2215   No results found.   Recent Results (from the past 720 hour(s))  MRSA PCR SCREENING     Status: None   Collection Time    03/12/13  2:15 AM      Result Value Range Status   MRSA by PCR NEGATIVE  NEGATIVE Final   Comment:            The GeneXpert MRSA Assay (FDA     approved for NASAL specimens     only), is one component of a     comprehensive MRSA colonization     surveillance program. It is not     intended to diagnose MRSA     infection nor to guide or     monitor treatment for     MRSA infections.     Impression/Recommendation  #1 Recurrent severe oral ulcers: THIS SURELY IS NOT HSV1 infection UNLESS we are dealing with acyclovir R virus.   I am stopping his acylclovir I will give him HIGH DOSE FLUCONAOLE in case of his pathology is fungal  I would start him on colchicine And I would DEFINITELY COORDINATE with [facility] who had seen the pt in April  I would consider IV high  dose corticosteroids  #2 large heaped up white plaques on tong and hard palate. Certainly could be a Candida superinfection. I do also wonder about oral leukoplakia and even malignant disease. --All place the patient on high-dose fluconazole. --I. think this patient would ultimately benefit from ENT expertise and potentially a biopsy of this time and other lesions but.   #3 screening all skin again for HIV by RNA testing   Thank you so much for this interesting consult  [facility] [phone] (pager) [phone] (office) 03/12/2013, 4:13 PM  Paulette Blanch Dam 03/12/2013, 4:13 PM

## 2013-03-12 NOTE — Progress Notes (Signed)
ANTIBIOTIC CONSULT NOTE - INITIAL  Pharmacy Consult for Acyclovir Indication: Herpes simplex infection  No Known Allergies  Patient Measurements: Height: 6' (182.9 cm) Weight: 148 lb (67.132 kg) IBW/kg (Calculated) : 77.6 Adjusted Body Weight:   Vital Signs: Temp: 98.4 F (36.9 C) (06/04 0215) Temp src: Axillary (06/04 0215) BP: 124/72 mmHg (06/04 0215) Pulse Rate: 96 (06/04 0215) Intake/Output from previous day: 06/03 0701 - 06/04 0700 In: 113.4 [IV Piggyback:113.4] Out: -  Intake/Output from this shift: Total I/O In: 113.4 [IV Piggyback:113.4] Out: -   Labs:  Recent Labs  03/09/13 2115 03/11/13 2320 03/11/13 2322 03/12/13 0345  WBC 6.6 6.9  --  6.4  HGB 16.1 14.6 15.6 12.3*  PLT 228 255  --  186  CREATININE 1.15  --  0.90  --    Estimated Creatinine Clearance: 120.1 ml/min (by C-G formula based on Cr of 0.9). No results found for this basename: VANCOTROUGH, Leodis Binet, VANCORANDOM, GENTTROUGH, GENTPEAK, GENTRANDOM, TOBRATROUGH, TOBRAPEAK, TOBRARND, AMIKACINPEAK, AMIKACINTROU, AMIKACIN,  in the last 72 hours   Microbiology: Recent Results (from the past 720 hour(s))  MRSA PCR SCREENING     Status: None   Collection Time    03/12/13  2:15 AM      Result Value Range Status   MRSA by PCR NEGATIVE  NEGATIVE Final   Comment:            The GeneXpert MRSA Assay (FDA     approved for NASAL specimens     only), is one component of a     comprehensive MRSA colonization     surveillance program. It is not     intended to diagnose MRSA     infection nor to guide or     monitor treatment for     MRSA infections.    Medical History: Past Medical History  Diagnosis Date  . Mouth ulcers   . MRSA (methicillin resistant staph aureus) culture positive   . Herpes     Medications:  Anti-infectives   Start     Dose/Rate Route Frequency Ordered Stop   03/12/13 0300  acyclovir (ZOVIRAX) 670 mg in dextrose 5 % 100 mL IVPB     10 mg/kg  67.2 kg 113.4 mL/hr over 60  Minutes Intravenous 3 times per day 03/12/13 0145       Assessment: Patient with Herpes simplex infection in mouth.    Goal of Therapy:  Acyclovir dosing based on manufacturer dosing guidelines.  Plan:  Follow up culture results Acyclovir 10mg /kg iv q8hr. Length of therapy and when/if change to PO to be addresses by MD  Darlina Guys, Tressie Ellis 03/12/2013,4:26 AM

## 2013-03-13 DIAGNOSIS — E43 Unspecified severe protein-calorie malnutrition: Secondary | ICD-10-CM

## 2013-03-13 LAB — BASIC METABOLIC PANEL
CO2: 20 mEq/L (ref 19–32)
Calcium: 8.8 mg/dL (ref 8.4–10.5)
Potassium: 4.2 mEq/L (ref 3.5–5.1)
Sodium: 137 mEq/L (ref 135–145)

## 2013-03-13 MED ORDER — HYDROCODONE-ACETAMINOPHEN 7.5-325 MG PO TABS
1.0000 | ORAL_TABLET | Freq: Four times a day (QID) | ORAL | Status: DC | PRN
  Administered 2013-03-15 (×2): 1 via ORAL
  Filled 2013-03-13 (×2): qty 1

## 2013-03-13 MED ORDER — HYDROMORPHONE HCL PF 1 MG/ML IJ SOLN
1.0000 mg | INTRAMUSCULAR | Status: DC | PRN
  Administered 2013-03-13 – 2013-03-14 (×7): 1 mg via INTRAVENOUS
  Filled 2013-03-13 (×7): qty 1

## 2013-03-13 MED ORDER — LIDOCAINE-EPINEPHRINE 1 %-1:100000 IJ SOLN
20.0000 mL | Freq: Once | INTRAMUSCULAR | Status: AC
  Administered 2013-03-13: 20 mL
  Filled 2013-03-13: qty 20

## 2013-03-13 MED ORDER — METHYLPREDNISOLONE SODIUM SUCC 125 MG IJ SOLR
60.0000 mg | Freq: Three times a day (TID) | INTRAMUSCULAR | Status: DC
  Administered 2013-03-13 – 2013-03-15 (×6): 60 mg via INTRAVENOUS
  Filled 2013-03-13 (×9): qty 0.96

## 2013-03-13 MED ORDER — HYDROMORPHONE HCL PF 1 MG/ML IJ SOLN
1.0000 mg | Freq: Once | INTRAMUSCULAR | Status: AC
  Administered 2013-03-13: 1 mg via INTRAVENOUS
  Filled 2013-03-13: qty 1

## 2013-03-13 NOTE — Procedures (Signed)
Preop diagnosis: Recurrent stomatitis Postop diagnosis: same Procedure: Right buccal mucosa biopsy Surgeon: Jenne Pane Anesth: Local with 1% lidocaine with 1:100,000 epinephrine Compl: None Findings: The buccal, palatal, lip, and tongue mucosa are all involved with an inflammatory process without discrete ulcers.  The buccal mucosa is inflamed and thickened.  Biopsy material was sent in formalin and Michel's solution. Description: After discussing risks, benefits, and alternatives, the right buccal mucosa was injected with local anesthetic.  An elliptical incision was made with an 11 blade scalpel and the segment of mucosa was removed with the same instrument.  Bleeding was controlled with pressure.  The mucosa was closed with 4-0 chromic in a simple, interrupted fashion.  The patient tolerated the procedure well without complication.  The specimen was bisected and half was placed in formalin and half was placed in Michel's solution.

## 2013-03-13 NOTE — Progress Notes (Signed)
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 Reportedly patient follows with ID and has been seen by ENT and opthalmology at [facility]. He was admitted in dec 2013 for similar complains and ruled out for HSV DNA by PCR and negative cx on tzank smear.  Continue IV acyclovir q8hr  Prn viscous lidocaine and magic mouth wash. Prn dilaudid and vicodin  ID consult appreciated.  Given several negative HSV serology and cx in past, recommend to stop acyclovir.  HIV PCR negative. Started on IV fluconazole empirically. Clear liquids as tolerated.  i have asked for ENT consult to evaluate patient and biopsy the lesion. On calling [facility], I was informed that patient had an appt in April which he missed.  -if symptoms persist i will start him on high dose steroid.   Code Status: full  Discussed with wife on 6/4  Disposition Plan:home once improved   Consultants:  ID  ENT   Procedures:  none Antibiotics:  IV acyclovir 6/3-6/4 IV fluconazole 6/4>>  HPI/Subjective:  Continues to have  mouth pain and difficulty swallowing . Able to take sips only   Objective: Filed Vitals:   03/12/13 0500 03/12/13 1351 03/12/13 2125 03/13/13 0614  BP: 105/53 115/56 132/81 104/69  Pulse: 97 87 82 84  Temp: 98.5 F (36.9 C) 98.3 F (36.8 C) 98.5 F (36.9 C) 97.8 F (36.6 C)  TempSrc: Axillary  Axillary Axillary  Resp: 20 18 20 20   Height:      Weight:      SpO2: 97% 96% 100% 100%    Intake/Output Summary (Last 24 hours) at 03/13/13 0952 Last data filed at 03/12/13 1900  Gross per 24 hour  Intake   1360 ml  Output      0 ml  Net   1360 ml   Filed Weights   03/12/13 0140 03/12/13 0215  Weight: 67.189 kg (148 lb 2 oz) 67.132 kg (148 lb)    Exam: General: Young male lying in bed in NAD  HEENT: no pallor, crusted ulcers over  lower lips, foul breath deeply coated tongue and palate, dry mucosa, no obvious tongue ulceration noted  Cardiovascular: N S1&S2, no murmurs  Respiratory: clear b/l, no added sounds  Abdomen: soft, NT, ND, BS+  Musculoskeletal: warm, no edema  CNS AAOX3  Data Reviewed: Basic Metabolic Panel:  Recent Labs Lab 03/09/13 2115 03/11/13 2322 03/12/13 0345 03/13/13 0345  NA 135 138 136 137  K 4.0 4.7 3.3* 4.2  CL 96 105 99 103  CO2 25  --  24 20  GLUCOSE 87 83 92 68*  BUN 14 14 11 9   CREATININE 1.15 0.90 0.99 0.98  CALCIUM 10.1  --  8.8 8.8   Liver Function Tests:  Recent Labs Lab 03/12/13 0345  AST 17  ALT 9  ALKPHOS 83  BILITOT 0.7  PROT 7.7  ALBUMIN 3.4*   No results found for this basename: LIPASE, AMYLASE,  in the last 168 hours No results found for this basename: AMMONIA,  in the last 168 hours CBC:  Recent Labs Lab 03/09/13 2115 03/11/13 2320 03/11/13 2322 03/12/13 0345  WBC 6.6 6.9  --  6.4  NEUTROABS 4.1 4.1  --   --   HGB 16.1 14.6 15.6 12.3*  HCT 45.4 40.7 46.0 35.7*  MCV 88.2 87.5  --  87.3  PLT 228 255  --  186   Cardiac Enzymes: No results found for this basename: CKTOTAL, CKMB, CKMBINDEX, TROPONINI,  in the last 168 hours BNP (last 3 results) No results found for this basename: PROBNP,  in the last 8760 hours CBG: No results found for this basename: GLUCAP,  in the last 168 hours  Recent Results (from the past 240 hour(s))  MRSA PCR SCREENING     Status: None   Collection Time    03/12/13  2:15 AM      Result Value Range Status   MRSA by PCR NEGATIVE  NEGATIVE Final   Comment:            The GeneXpert MRSA Assay (FDA     approved for NASAL specimens     only), is one component of a     comprehensive MRSA colonization     surveillance program. It is not     intended to diagnose MRSA     infection nor to guide or     monitor treatment for     MRSA infections.     Studies: No results found.  Scheduled Meds: . colchicine  0.6 mg  Oral BID  . enoxaparin (LOVENOX) injection  40 mg Subcutaneous Q24H  . fluconazole (DIFLUCAN) IV  400 mg Intravenous Q24H  . potassium chloride  40 mEq Oral Once   Continuous Infusions: . sodium chloride 100 mL/hr at 03/13/13 0113      Time spent: 25 minutes    Cindy Fullman  [facility] Pager [phone] If 7PM-7AM, please contact night-coverage at [email or web address], password [facility] 03/13/2013, 9:52 AM  LOS: 2 days

## 2013-03-13 NOTE — Progress Notes (Signed)
[facility]    Subjective: Still with painful ulcers on lips, lesions on palate and tongue  He tells me that he has also had painful penile ulcers in the past   Antibiotics:  Anti-infectives   Start     Dose/Rate Route Frequency Ordered Stop   03/12/13 1500  fluconazole (DIFLUCAN) IVPB 400 mg     400 mg 100 mL/hr over 120 Minutes Intravenous Every 24 hours 03/12/13 1409     03/12/13 0300  acyclovir (ZOVIRAX) 670 mg in dextrose 5 % 100 mL IVPB  Status:  Discontinued     10 mg/kg  67.2 kg 113.4 mL/hr over 60 Minutes Intravenous 3 times per day 03/12/13 0145 03/12/13 1613      Medications: Scheduled Meds: . colchicine  0.6 mg Oral BID  . enoxaparin (LOVENOX) injection  40 mg Subcutaneous Q24H  . fluconazole (DIFLUCAN) IV  400 mg Intravenous Q24H  . potassium chloride  40 mEq Oral Once   Continuous Infusions: . sodium chloride 100 mL/hr at 03/13/13 0113   PRN Meds:.acetaminophen, acetaminophen, guaiFENesin-dextromethorphan, HYDROcodone-acetaminophen, HYDROmorphone (DILAUDID) injection, lidocaine, magic mouthwash, ondansetron (ZOFRAN) IV, ondansetron   Objective: Weight change:   Intake/Output Summary (Last 24 hours) at 03/13/13 1101 Last data filed at 03/12/13 1900  Gross per 24 hour  Intake   1360 ml  Output      0 ml  Net   1360 ml   Blood pressure 104/69, pulse 84, temperature 97.8 F (36.6 C), temperature source Axillary, resp. rate 20, height 6' (1.829 m), weight 148 lb (67.132 kg), SpO2 100.00%. Temp:  [97.8 F (36.6 C)-98.5 F (36.9 C)] 97.8 F (36.6 C) (06/05 0614) Pulse Rate:  [82-87] 84 (06/05 0614) Resp:  [18-20] 20 (06/05 0614) BP: (104-132)/(56-81) 104/69 mmHg (06/05 0614) SpO2:  [96 %-100 %] 100 % (06/05 1610)  Physical Exam: General: somnolent but arousable and oriented x3, not in any acute distress.  HEENT: anicteric scleraEOMI, oropharynx with heaped up white coated material on tong and top of palate. He is  ulcerations as well in his mouth and a severe one in the corner of his right lip or he also has edema. His speech is difficult to understand.  CVS regular rate, normal r, no murmur rubs or gallops  Chest: clear to auscultation bilaterally, no wheezing, rales or rhonchi  Abdomen: soft nontender, nondistended, normal bowel sounds,  Extremities: no clubbing or edema noted bilaterally  Skin: no rashes  Neuro: nonfocal, strength and sensation intact   Lab Results:  Recent Labs  03/11/13 2320 03/11/13 2322 03/12/13 0345  WBC 6.9  --  6.4  HGB 14.6 15.6 12.3*  HCT 40.7 46.0 35.7*  PLT 255  --  186    BMET  Recent Labs  03/12/13 0345 03/13/13 0345  NA 136 137  K 3.3* 4.2  CL 99 103  CO2 24 20  GLUCOSE 92 68*  BUN 11 9  CREATININE 0.99 0.98  CALCIUM 8.8 8.8    Micro Results: Recent Results (from the past 240 hour(s))  MRSA PCR SCREENING     Status: None   Collection Time    03/12/13  2:15 AM      Result Value Range Status   MRSA by PCR NEGATIVE  NEGATIVE Final   Comment:            The GeneXpert MRSA Assay (FDA     approved for NASAL specimens     only), is one component of a  comprehensive MRSA colonization     surveillance program. It is not     intended to diagnose MRSA     infection nor to guide or     monitor treatment for     MRSA infections.    Studies/Results: No results found.    Assessment/Plan: Paul House is a 22 y.o. male with Recurrent severe oral ulcers   #1 Recurrent oral ulcers with white coating as well on tongue. Today he tells me he also has had prior penile ulcers but not now. His optho exam at [facility] failed to show uveitis.  THIS SURELY IS NOT HSV1 infection UNLESS we are dealing with acyclovir R virus. I have stopped his his acylclovir   I will give him HIGH DOSE FLUCONAOLE in case of his pathology is fungal though I fail to understand why he might be getting recurrent oral thrush? WIll take a look at what other immune  deficiency tests have been done  I have started him on colchicine   I DO believe he was seen by Delane Ginger as an inpt but he apparently missed outpt appt  I agree with ENT consult and a biopsy would help [place] get to the bottom of this recurrent costly problem for this pt and health care system given his mx admissions here, and [facility]  I would consider IV high dose corticosteroids   #2 large heaped up white plaques on tong and hard palate. Certainly could be a Candida superinfection. I do also wonder about oral leukoplakia and even malignant disease.   --All place the patient on high-dose fluconazole.  --I. Agree with ENT consult  #3 screening all skin again for HIV by RNA testing       LOS: 2 days   Acey Lav 03/13/2013, 11:01 AM

## 2013-03-13 NOTE — Progress Notes (Signed)
Nutrition Brief Note  Pt reports pain from mouth sores improving. Pt able to consume 100% of 1 container of whole milk today. Pt declined wanting any nutritional supplements. Provided pt with another carton of milk and container of vanilla ice cream. Will continue to monitor.   Levon Hedger MS, RD, LDN [phone] Pager [phone] After Hours Pager

## 2013-03-13 NOTE — Consult Note (Signed)
Reason for Consult:Stomatitis Referring Physician: Hospitalist/ID  Paul House is an 22 y.o. male.  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  He has a flare-up of his condition every three months or so and has had to be hospitalized, like now, several times for dehydration and inability to swallow.  Pain associated with the ulcers is severe and is currently unchanged in spite of anti-viral, anti-fungal, and steroid therapy.  It is not clear that any previous medical therapy has been effective or if the stomatitis just calms down after a time.  His previous extensive workup is well documented by ID and the hospitalist including evaluations at [facility].  He has had blood work and swabs but says he does not recall a mucosal biopsy.  What he has been referring to as a biopsy was done 2-3 years ago and sounds like it consisted of a cotton swab rubbing one of the ulcers.  Workup thus far has apparently ruled out HSV, HIV, Behcet's, lupus or other autoimmune conditions, or G6PD.  He does have genitalia involvement at times.  Biopsy has been requested.  Past Medical History  Diagnosis Date  . Mouth ulcers   . MRSA (methicillin resistant staph aureus) culture positive   . Herpes     History reviewed. No pertinent past surgical history.  Family History  Problem Relation Age of Onset  . Heart disease Mother     Social History:  reports that he has been smoking Cigarettes.  He has been smoking about 0.25 packs per day. He has never used smokeless tobacco. He reports that  drinks alcohol. He reports that he does not use illicit drugs.  Allergies: No Known Allergies  Medications: I have reviewed the patient's current medications.  Results for orders placed during the hospital encounter of 03/11/13 (from the past 48 hour(s))  CBC WITH DIFFERENTIAL     Status: Abnormal   Collection Time    03/11/13 11:20 PM      Result Value Range   WBC 6.9   4.0 - 10.5 K/uL   RBC 4.65  4.22 - 5.81 MIL/uL   Hemoglobin 14.6  13.0 - 17.0 g/dL   HCT 96.2  95.2 - 84.1 %   MCV 87.5  78.0 - 100.0 fL   MCH 31.4  26.0 - 34.0 pg   MCHC 35.9  30.0 - 36.0 g/dL   RDW 32.4  40.1 - 02.7 %   Platelets 255  150 - 400 K/uL   Neutrophils Relative % 59  43 - 77 %   Neutro Abs 4.1  1.7 - 7.7 K/uL   Lymphocytes Relative 24  12 - 46 %   Lymphs Abs 1.7  0.7 - 4.0 K/uL   Monocytes Relative 13 (*) 3 - 12 %   Monocytes Absolute 0.9  0.1 - 1.0 K/uL   Eosinophils Relative 4  0 - 5 %   Eosinophils Absolute 0.3  0.0 - 0.7 K/uL   Basophils Relative 0  0 - 1 %   Basophils Absolute 0.0  0.0 - 0.1 K/uL  POCT I-STAT, CHEM 8     Status: Abnormal   Collection Time    03/11/13 11:22 PM      Result Value Range   Sodium 138  135 - 145 mEq/L   Potassium 4.7  3.5 - 5.1 mEq/L   Chloride 105  96 - 112 mEq/L   BUN 14  6 - 23 mg/dL   Creatinine,  Ser 0.90  0.50 - 1.35 mg/dL   Glucose, Bld 83  70 - 99 mg/dL   Calcium, Ion 1.61 (*) 1.12 - 1.23 mmol/L   TCO2 26  0 - 100 mmol/L   Hemoglobin 15.6  13.0 - 17.0 g/dL   HCT 09.6  04.5 - 40.9 %  HIV ANTIBODY (ROUTINE TESTING)     Status: None   Collection Time    03/12/13  1:15 AM      Result Value Range   HIV NON REACTIVE  NON REACTIVE  MRSA PCR SCREENING     Status: None   Collection Time    03/12/13  2:15 AM      Result Value Range   MRSA by PCR NEGATIVE  NEGATIVE   Comment:            The GeneXpert MRSA Assay (FDA     approved for NASAL specimens     only), is one component of a     comprehensive MRSA colonization     surveillance program. It is not     intended to diagnose MRSA     infection nor to guide or     monitor treatment for     MRSA infections.  COMPREHENSIVE METABOLIC PANEL     Status: Abnormal   Collection Time    03/12/13  3:45 AM      Result Value Range   Sodium 136  135 - 145 mEq/L   Potassium 3.3 (*) 3.5 - 5.1 mEq/L   Comment: DELTA CHECK NOTED     REPEATED TO VERIFY   Chloride 99  96 - 112 mEq/L    CO2 24  19 - 32 mEq/L   Glucose, Bld 92  70 - 99 mg/dL   BUN 11  6 - 23 mg/dL   Creatinine, Ser 8.11  0.50 - 1.35 mg/dL   Calcium 8.8  8.4 - 91.4 mg/dL   Total Protein 7.7  6.0 - 8.3 g/dL   Albumin 3.4 (*) 3.5 - 5.2 g/dL   AST 17  0 - 37 U/L   ALT 9  0 - 53 U/L   Alkaline Phosphatase 83  39 - 117 U/L   Total Bilirubin 0.7  0.3 - 1.2 mg/dL   GFR calc non Af Amer >90  >90 mL/min   GFR calc Af Amer >90  >90 mL/min   Comment:            The eGFR has been calculated     using the CKD EPI equation.     This calculation has not been     validated in all clinical     situations.     eGFR's persistently     <90 mL/min signify     possible Chronic Kidney Disease.  CBC     Status: Abnormal   Collection Time    03/12/13  3:45 AM      Result Value Range   WBC 6.4  4.0 - 10.5 K/uL   RBC 4.09 (*) 4.22 - 5.81 MIL/uL   Hemoglobin 12.3 (*) 13.0 - 17.0 g/dL   Comment: REPEATED TO VERIFY     DELTA CHECK NOTED   HCT 35.7 (*) 39.0 - 52.0 %   MCV 87.3  78.0 - 100.0 fL   MCH 30.1  26.0 - 34.0 pg   MCHC 34.5  30.0 - 36.0 g/dL   RDW 78.2  95.6 - 21.3 %   Platelets 186  150 - 400 K/uL  Comment: REPEATED TO VERIFY     DELTA CHECK NOTED  VITAMIN B12     Status: None   Collection Time    03/12/13  2:34 PM      Result Value Range   Vitamin B-12 721  211 - 911 pg/mL  BASIC METABOLIC PANEL     Status: Abnormal   Collection Time    03/13/13  3:45 AM      Result Value Range   Sodium 137  135 - 145 mEq/L   Potassium 4.2  3.5 - 5.1 mEq/L   Comment: DELTA CHECK NOTED     REPEATED TO VERIFY     NO VISIBLE HEMOLYSIS   Chloride 103  96 - 112 mEq/L   CO2 20  19 - 32 mEq/L   Glucose, Bld 68 (*) 70 - 99 mg/dL   BUN 9  6 - 23 mg/dL   Creatinine, Ser 9.14  0.50 - 1.35 mg/dL   Calcium 8.8  8.4 - 78.2 mg/dL   GFR calc non Af Amer >90  >90 mL/min   GFR calc Af Amer >90  >90 mL/min   Comment:            The eGFR has been calculated     using the CKD EPI equation.     This calculation has not been      validated in all clinical     situations.     eGFR's persistently     <90 mL/min signify     possible Chronic Kidney Disease.    No results found.  Review of Systems  Constitutional: Positive for weight loss.  HENT: Positive for sore throat.   All other systems reviewed and are negative.   Blood pressure 113/73, pulse 108, temperature 97.9 F (36.6 C), temperature source Axillary, resp. rate 18, height 6' (1.829 m), weight 67.132 kg (148 lb), SpO2 99.00%. Physical Exam  Constitutional: He is oriented to person, place, and time. He appears well-developed and well-nourished. No distress.  HENT:  Head: Normocephalic and atraumatic.  Right Ear: External ear normal.  Left Ear: External ear normal.  Nose: Nose normal.  Dry blood and friable lining of lips.  Buccal mucosa inflamed without discrete ulcer.  Mucosa thickened and edematous diffusely with some white color.  Similar changes to palate.  Gingiva with some thickening, looks less inflamed.  Thick white coating on much of tongue with ulcerations along lateral edge of tongue.  Unable to open mouth very far due to pain.  Eyes: Conjunctivae and EOM are normal. Pupils are equal, round, and reactive to light.  Small red bump on right superior lid margin.  Neck: Normal range of motion. Neck supple.  Cardiovascular: Normal rate.   Respiratory: Effort normal.  GI:  Did not examine.  Genitourinary:  Did not examine.  Musculoskeletal: Normal range of motion.  Neurological: He is alert and oriented to person, place, and time. No cranial nerve deficit.  Skin: Skin is warm and dry.  Psychiatric: He has a normal mood and affect. His behavior is normal. Judgment and thought content normal.    Assessment/Plan: Recurrent stomatitis, etiology unclear. I agree with the recommendation for a mucosal biopsy and one was performed.  See procedure note.  Hopefully, this will shed some light on this mysterious problem and lead to effective  treatment.  Olanda Downie 03/13/2013, 5:33 PM

## 2013-03-13 NOTE — ED Provider Notes (Signed)
Medical screening examination/treatment/procedure(s) were performed by non-physician practitioner and as supervising physician I was immediately available for consultation/collaboration.  Leoma Folds, MD 03/13/13 0700 

## 2013-03-14 DIAGNOSIS — K1321 Leukoplakia of oral mucosa, including tongue: Secondary | ICD-10-CM

## 2013-03-14 LAB — IGE: IgE (Immunoglobulin E), Serum: 175.1 IU/mL (ref 0.0–180.0)

## 2013-03-14 LAB — IGG, IGA, IGM: IgM, Serum: 91 mg/dL (ref 41–251)

## 2013-03-14 LAB — HIV-1 RNA QUANT-NO REFLEX-BLD
HIV 1 RNA Quant: <20 copies/mL (ref <20)
HIV-1 RNA Quant, Log: <1.3 {Log} (ref <1.30)

## 2013-03-14 LAB — T-HELPER CELLS (CD4) COUNT (NOT AT ARMC): CD4 T Cell Abs: 110 uL — ABNORMAL LOW (ref 400–2700)

## 2013-03-14 MED ORDER — LIP MEDEX EX OINT
TOPICAL_OINTMENT | CUTANEOUS | Status: DC | PRN
  Administered 2013-03-14: 1 via TOPICAL
  Filled 2013-03-14: qty 7

## 2013-03-14 NOTE — Progress Notes (Signed)
Nutrition Brief Note  Pt ate 100% of regular breakfast tray this morning which consisted of 2 whole milks, 1 pudding, with full plate of eggs and grits. Encouraged pt to continue to eat well.   Levon Hedger MS, RD, LDN [phone] Pager [phone] After Hours Pager

## 2013-03-14 NOTE — Progress Notes (Signed)
[facility] PROGRESS NOTE  Paul House XWR:[id number] DOB: [birth year] DOA: 03/11/2013 PCP: Default, Provider, MD  Assessment/Plan:  Acute on chronic Stomatitis  No clear etiology. Reportedly patient follows with ID and has been seen by ENT and opthalmology at [facility]. He was admitted in dec 2013 for similar complains and ruled out for HSV DNA by PCR and negative cx on tzank smear.  Prn viscous lidocaine and magic mouth wash. Prn dilaudid and vicodin , ID consult appreciated. Given several negative HSV serology and cx in past, recommend to stop acyclovir.  HIV PCR negative. Started on IV fluconazole empirically.  -added colchicine -appreciate  ENT consult. patient had biopsy of  the buccal mucosal lesion. On calling [facility], I was informed that patient had an appt in April which he missed.  -started  him on high dose steroid.  -feels better today and wants to try solid food.  continue pain meds. Encouraged to ambulate.  Code Status: full   Disposition Plan:home once improved ( likely in 1-2 days)  Consultants:  ID  ENT Procedures:  Oral biopsy  Antibiotics:  IV acyclovir 6/3-6/4  IV fluconazole 6/4>>    HPI/Subjective:  Continues to have mouth pain and difficulty swallowing . Able to take sips only  Objective: Filed Vitals:   03/13/13 1418 03/13/13 2110 03/14/13 0500 03/14/13 0558  BP: 113/73 126/82 134/94 119/78  Pulse: 108 85 66 78  Temp: 97.9 F (36.6 C) 97.4 F (36.3 C) 97.2 F (36.2 C)   TempSrc: Axillary Axillary Axillary   Resp: 18 16 16    Height:      Weight:      SpO2: 99% 100% 100%     Intake/Output Summary (Last 24 hours) at 03/14/13 1421 Last data filed at 03/14/13 0555  Gross per 24 hour  Intake   1480 ml  Output      0 ml  Net   1480 ml   Filed Weights   03/12/13 0140 03/12/13 0215  Weight: 67.189 kg (148 lb 2 oz) 67.132 kg (148 lb)    Exam:  General: Young male lying in bed in NAD  HEENT: no pallor, crusted  ulcers over lower lips, foul breath deeply coated tongue and palate, dry mucosa, no obvious tongue ulceration noted  Cardiovascular: N S1&S2, no murmurs  Respiratory: clear b/l, no added sounds  Abdomen: soft, NT, ND, BS+  Musculoskeletal: warm, no edema  CNS AAOX3  Data Reviewed: Basic Metabolic Panel:  Recent Labs Lab 03/09/13 2115 03/11/13 2322 03/12/13 0345 03/13/13 0345  NA 135 138 136 137  K 4.0 4.7 3.3* 4.2  CL 96 105 99 103  CO2 25  --  24 20  GLUCOSE 87 83 92 68*  BUN 14 14 11 9   CREATININE 1.15 0.90 0.99 0.98  CALCIUM 10.1  --  8.8 8.8   Liver Function Tests:  Recent Labs Lab 03/12/13 0345  AST 17  ALT 9  ALKPHOS 83  BILITOT 0.7  PROT 7.7  ALBUMIN 3.4*   No results found for this basename: LIPASE, AMYLASE,  in the last 168 hours No results found for this basename: AMMONIA,  in the last 168 hours CBC:  Recent Labs Lab 03/09/13 2115 03/11/13 2320 03/11/13 2322 03/12/13 0345  WBC 6.6 6.9  --  6.4  NEUTROABS 4.1 4.1  --   --   HGB 16.1 14.6 15.6 12.3*  HCT 45.4 40.7 46.0 35.7*  MCV 88.2 87.5  --  87.3  PLT 228 255  --  186   Cardiac Enzymes: No results found for this basename: CKTOTAL, CKMB, CKMBINDEX, TROPONINI,  in the last 168 hours BNP (last 3 results) No results found for this basename: PROBNP,  in the last 8760 hours CBG: No results found for this basename: GLUCAP,  in the last 168 hours  Recent Results (from the past 240 hour(s))  MRSA PCR SCREENING     Status: None   Collection Time    03/12/13  2:15 AM      Result Value Range Status   MRSA by PCR NEGATIVE  NEGATIVE Final   Comment:            The GeneXpert MRSA Assay (FDA     approved for NASAL specimens     only), is one component of a     comprehensive MRSA colonization     surveillance program. It is not     intended to diagnose MRSA     infection nor to guide or     monitor treatment for     MRSA infections.     Studies: No results found.  Scheduled Meds: .  colchicine  0.6 mg Oral BID  . enoxaparin (LOVENOX) injection  40 mg Subcutaneous Q24H  . fluconazole (DIFLUCAN) IV  400 mg Intravenous Q24H  . methylPREDNISolone (SOLU-MEDROL) injection  60 mg Intravenous Q8H  . potassium chloride  40 mEq Oral Once   Continuous Infusions: . sodium chloride 100 mL/hr at 03/14/13 1154       Time spent: 25 minutes    Paul House  [facility] Pager [phone] If 7PM-7AM, please contact night-coverage at [email or web address], password [facility] 03/14/2013, 2:21 PM  LOS: 3 days

## 2013-03-14 NOTE — Progress Notes (Signed)
[facility]    Subjective: Still with painful ulcers on lips, lesions on palate and tongue possibly improved he believes he is sp biopsy by Dr. Jenne Pane   Antibiotics:  Anti-infectives   Start     Dose/Rate Route Frequency Ordered Stop   03/12/13 1500  fluconazole (DIFLUCAN) IVPB 400 mg     400 mg 100 mL/hr over 120 Minutes Intravenous Every 24 hours 03/12/13 1409     03/12/13 0300  acyclovir (ZOVIRAX) 670 mg in dextrose 5 % 100 mL IVPB  Status:  Discontinued     10 mg/kg  67.2 kg 113.4 mL/hr over 60 Minutes Intravenous 3 times per day 03/12/13 0145 03/12/13 1613      Medications: Scheduled Meds: . colchicine  0.6 mg Oral BID  . enoxaparin (LOVENOX) injection  40 mg Subcutaneous Q24H  . fluconazole (DIFLUCAN) IV  400 mg Intravenous Q24H  . methylPREDNISolone (SOLU-MEDROL) injection  60 mg Intravenous Q8H  . potassium chloride  40 mEq Oral Once   Continuous Infusions:   PRN Meds:.acetaminophen, acetaminophen, guaiFENesin-dextromethorphan, HYDROcodone-acetaminophen, HYDROmorphone (DILAUDID) injection, lidocaine, lip balm, magic mouthwash, ondansetron (ZOFRAN) IV, ondansetron   Objective: Weight change:   Intake/Output Summary (Last 24 hours) at 03/14/13 1457 Last data filed at 03/14/13 0555  Gross per 24 hour  Intake   1480 ml  Output      0 ml  Net   1480 ml   Blood pressure 119/78, pulse 78, temperature 97.2 F (36.2 C), temperature source Axillary, resp. rate 16, height 6' (1.829 m), weight 148 lb (67.132 kg), SpO2 100.00%. Temp:  [97.2 F (36.2 C)-97.4 F (36.3 C)] 97.2 F (36.2 C) (06/06 0500) Pulse Rate:  [66-85] 78 (06/06 0558) Resp:  [16] 16 (06/06 0500) BP: (119-134)/(78-94) 119/78 mmHg (06/06 0558) SpO2:  [100 %] 100 % (06/06 0500)  Physical Exam: General: somnolent but arousable and oriented x3, not in any acute distress.  HEENT: anicteric scleraEOMI, oropharynx with heaped up white coated material on tong and top of  palate. He is ulcerations as well in his mouth and a severe one in the corner of his right lip or he also has edema. His speech is difficult to understand.  CVS regular rate, normal r, no murmur rubs or gallops  Chest: clear to auscultation bilaterally, no wheezing, rales or rhonchi  Abdomen: soft nontender, nondistended, normal bowel sounds,  Extremities: no clubbing or edema noted bilaterally  Skin: no rashes  Neuro: nonfocal, strength and sensation intact   Lab Results:  Recent Labs  03/11/13 2320 03/11/13 2322 03/12/13 0345  WBC 6.9  --  6.4  HGB 14.6 15.6 12.3*  HCT 40.7 46.0 35.7*  PLT 255  --  186    BMET  Recent Labs  03/12/13 0345 03/13/13 0345  NA 136 137  K 3.3* 4.2  CL 99 103  CO2 24 20  GLUCOSE 92 68*  BUN 11 9  CREATININE 0.99 0.98  CALCIUM 8.8 8.8    Micro Results: Recent Results (from the past 240 hour(s))  MRSA PCR SCREENING     Status: None   Collection Time    03/12/13  2:15 AM      Result Value Range Status   MRSA by PCR NEGATIVE  NEGATIVE Final   Comment:            The GeneXpert MRSA Assay (FDA     approved for NASAL specimens     only), is one component of a  comprehensive MRSA colonization     surveillance program. It is not     intended to diagnose MRSA     infection nor to guide or     monitor treatment for     MRSA infections.    Studies/Results: No results found.    Assessment/Plan: Paul House is a 22 y.o. male with Recurrent severe oral ulcers   #1 Recurrent oral ulcers with white coating as well on tongue. Today he tells me he also has had prior penile ulcers but not now. His optho exam at [facility] failed to show uveitis.  THIS SURELY IS NOT HSV1 infection UNLESS we are dealing with acyclovir R virus. I have stopped his his acylclovir   I will give him HIGH DOSE FLUCONAOLE in case of his pathology is fungal though I fail to understand why he might be getting recurrent oral thrush? WIll take a look at what  other immune deficiency tests have been done  I have started him on colchicine   He is sp biopsy by ENT of buccal mucosa  And he is now on  IV high dose corticosteroids   #2 large heaped up white plaques on tong and hard palate. Certainly could be a Candida superinfection. I do also wonder about oral leukoplakia and even malignant disease.   --on high-dose fluconazole.  --ENT following  #3 screening: HIV EIA -, HIV RNA pending  Dr. Drue Second is available this weekend for questions.      LOS: 3 days   Acey Lav 03/14/2013, 2:57 PM

## 2013-03-15 DIAGNOSIS — K121 Other forms of stomatitis: Secondary | ICD-10-CM | POA: Diagnosis present

## 2013-03-15 DIAGNOSIS — K120 Recurrent oral aphthae: Secondary | ICD-10-CM | POA: Diagnosis present

## 2013-03-15 MED ORDER — OMEPRAZOLE MAGNESIUM 20 MG PO TBEC: 20.0000 mg | DELAYED_RELEASE_TABLET | Freq: Every day | ORAL | Status: DC

## 2013-03-15 MED ORDER — PREDNISONE 20 MG PO TABS: ORAL_TABLET | ORAL | Status: DC

## 2013-03-15 MED ORDER — COLCHICINE 0.6 MG PO TABS: 0.6000 mg | ORAL_TABLET | Freq: Two times a day (BID) | ORAL | Status: DC

## 2013-03-15 MED ORDER — MAGIC MOUTHWASH: 10.0000 mL | ORAL | Status: DC | PRN

## 2013-03-15 MED ORDER — OXYCODONE-ACETAMINOPHEN 5-325 MG PO TABS: 2.0000 | ORAL_TABLET | Freq: Four times a day (QID) | ORAL | Status: DC | PRN

## 2013-03-15 MED ORDER — FLUCONAZOLE 200 MG PO TABS: 400.0000 mg | ORAL_TABLET | Freq: Every day | ORAL | Status: DC

## 2013-03-15 NOTE — Progress Notes (Signed)
Patient discharged per MD order. Discharge instructions reviewed with patient, wife and mother-in-law at bedside. Pt verbalized understanding of the importance of taking medications. Pt will follow up at [facility] ID and dermatology. Pt refused wheelchair and walked to the car with family for discharge home. Angelena Form, RN

## 2013-03-15 NOTE — Discharge Summary (Addendum)
Physician Discharge Summary  Paul House ZOX:[id number] DOB: 12/22/1990 DOA: 03/11/2013  PCP: Default, Provider, MD  Admit date: 03/11/2013 Discharge date: 03/15/2013  Time spent: 40 minutes  Recommendations for Outpatient Follow-up:  1. Home with outpt follow up with ID and dermatology at [facility]. Patient wills chdule for dermatology appt which he missed in the past.  2. We will follow up with his biopsy result early next week. 3.   Discharge Diagnoses:   Principal Problem:   Recurrent oral Stomatitis and mucositis  Active Problems:   Aphthous ulcer of mouth   Hypokalemia   Protein-calorie malnutrition, severe   Discharge Condition: fair  Diet recommendation: regular  Filed Weights   03/12/13 0140 03/12/13 0215  Weight: 67.189 kg (148 lb 2 oz) 67.132 kg (148 lb)    History of present illness:  22 year old male with a history of recurrent apthous ulcers since age of 28,  followed by infectious disease at [facility] who comes in with oral ulcerations with inability to eat anything and dehydration. He was hospitalized for the same last year in December.Patient reports having 3 episodes of these symptoms every year and was on prophylactic valtrex for possible HSV until recently. He reports having 3 flare ups in past 3 months.  He was recently  prescribed mouthwash for the aphthosis which has viscous lidocaine which is not helping. Also on colchicine and valtrex. Sores were becoming so severe that he has not been able to eat or drink for the past three days. No fever or chills.  He was admitted at [facility] in April by ID, ENT and ophthalmology and thought to have aphthous ulcers of unclear etiology.   HIV, lupus, G6PD, Bechet among multiple other infectious and autoimmune diseases were ruled out .     Hospital Course:  Acute on chronic Stomatitis  No clear etiology. Reportedly patient follows with ID and has been seen by ENT and opthalmology at [facility]. He was  admitted in dec 2013 for similar complains and ruled out for HSV DNA by PCR and negative cx on tzank smear.  Prn viscous lidocaine and magic mouth wash given. Prn dilaudid and vicodin given for pain,. ID consult appreciated. Given several negative HSV serology and cx in past, recommend to stop acyclovir as this is unlikely of HSV.  HIV PCR negative. Started on IV fluconazole empirically for possible candidal superinfection.  -added colchicine  -IgG was elevated . CD4 count was noted to be low ( 110 only) which is nonspecific could be associated with viral illness or even malnutrition. -appreciate ENT consult. patient had biopsy of the buccal mucosal lesion. On calling [facility], I was informed that patient had an appt in April which he missed.  -started him on high dose steroid and symptoms much improved today.  -tolerating solid food.  he is stable to be discharge and should follow up with ID and dermatology at [facility]. We will follow up on his oral biopsy results early next week. -discussed with Dr Ilsa Iha and agrees to discharge him on oral fluconazole for a 2 week course. i will also prescribe him a tapering dose of steroid and  magic mouth wash.   Malnutrition  secondary to recent poor po intake. Now able to tolerate full regular diet    Procedures: Right buccal bx by ENT on 6/5  Consultations:  ID--Dr Daiva Eves   ENT--Dr [facility]  Discharge Exam: Filed Vitals:   03/14/13 8119 03/14/13 1458 03/14/13 2259 03/15/13 0650  BP: 119/78 127/75  128/87 120/78  Pulse: 78 66 74 61  Temp:  97.3 F (36.3 C) 97.8 F (36.6 C) 97.4 F (36.3 C)  TempSrc:  Oral Oral Oral  Resp:  16 18 16   Height:      Weight:      SpO2:  100% 100% 100%    General: Young male lying in bed in NAD  HEENT: no pallor, crusted ulcers over lower lips,deeply coated tongue and palate, appears better today Cardiovascular: N S1&S2, no murmurs  Respiratory: clear b/l, no added sounds  Abdomen: soft, NT,  ND, BS+  Musculoskeletal: warm, no edema  CNS AAOX3   Discharge Instructions     Medication List    TAKE these medications       colchicine 0.6 MG tablet  Take 1 tablet (0.6 mg total) by mouth 2 (two) times daily.     fluconazole 200 MG tablet  Commonly known as:  DIFLUCAN  Take 2 tablets (400 mg total) by mouth daily. For 11 days     lidocaine 2 % solution  Commonly known as:  XYLOCAINE  Take 20 mLs by mouth as needed for pain.     magic mouthwash Soln  Take 10 mLs by mouth every 4 (four) hours as needed.     omeprazole 20 MG tablet  Commonly known as:  PRILOSEC OTC  Take 1 tablet (20 mg total) by mouth daily.     oxyCODONE-acetaminophen 5-325 MG per tablet  Commonly known as:  PERCOCET  Take 2 tablets by mouth every 6 (six) hours as needed for pain.     predniSONE 20 MG tablet  Commonly known as:  DELTASONE  Take 60 mg daily for 3 days, 50 mg daily for 3 days, then 40 mg daily for 3 days, then30 mg daily for 3 days, then 20 mg daily for 3 days , then 10 mg daily daily for 3 days then stop       No Known Allergies     Follow-up Information   Follow up with Default, Provider, MD.   Contact information:   will follow up with ID and dermatology at [facility].         The results of significant diagnostics from this hospitalization (including imaging, microbiology, ancillary and laboratory) are listed below for reference.    Significant Diagnostic Studies: No results found.  Microbiology: Recent Results (from the past 240 hour(s))  MRSA PCR SCREENING     Status: None   Collection Time    03/12/13  2:15 AM      Result Value Range Status   MRSA by PCR NEGATIVE  NEGATIVE Final   Comment:            The GeneXpert MRSA Assay (FDA     approved for NASAL specimens     only), is one component of a     comprehensive MRSA colonization     surveillance program. It is not     intended to diagnose MRSA     infection nor to guide or     monitor treatment for      MRSA infections.     Labs: Basic Metabolic Panel:  Recent Labs Lab 03/09/13 2115 03/11/13 2322 03/12/13 0345 03/13/13 0345  NA 135 138 136 137  K 4.0 4.7 3.3* 4.2  CL 96 105 99 103  CO2 25  --  24 20  GLUCOSE 87 83 92 68*  BUN 14 14 11 9   CREATININE 1.15 0.90 0.99 0.98  CALCIUM 10.1  --  8.8 8.8   Liver Function Tests:  Recent Labs Lab 03/12/13 0345  AST 17  ALT 9  ALKPHOS 83  BILITOT 0.7  PROT 7.7  ALBUMIN 3.4*   No results found for this basename: LIPASE, AMYLASE,  in the last 168 hours No results found for this basename: AMMONIA,  in the last 168 hours CBC:  Recent Labs Lab 03/09/13 2115 03/11/13 2320 03/11/13 2322 03/12/13 0345  WBC 6.6 6.9  --  6.4  NEUTROABS 4.1 4.1  --   --   HGB 16.1 14.6 15.6 12.3*  HCT 45.4 40.7 46.0 35.7*  MCV 88.2 87.5  --  87.3  PLT 228 255  --  186   Cardiac Enzymes: No results found for this basename: CKTOTAL, CKMB, CKMBINDEX, TROPONINI,  in the last 168 hours BNP: BNP (last 3 results) No results found for this basename: PROBNP,  in the last 8760 hours CBG: No results found for this basename: GLUCAP,  in the last 168 hours     Signed:  Jackqueline Aquilar  [facility] 03/15/2013, 10:27 AM

## 2013-03-19 LAB — MISCELLANEOUS TEST

## 2013-03-26 NOTE — ED Provider Notes (Signed)
History     CSN: [id number]  Arrival date & time 03/11/13  2100   First MD Initiated Contact with Patient 03/11/13 2218      Chief Complaint  Patient presents with  . mouth sores     (Consider location/radiation/quality/duration/timing/severity/associated sxs/prior treatment) HPI  Past Medical History  Diagnosis Date  . Mouth ulcers   . MRSA (methicillin resistant staph aureus) culture positive   . Herpes     History reviewed. No pertinent past surgical history.  Family History  Problem Relation Age of Onset  . Heart disease Mother     History  Substance Use Topics  . Smoking status: Current Every Day Smoker -- 0.25 packs/day    Types: Cigarettes  . Smokeless tobacco: Never Used  . Alcohol Use: Yes     Comment: occ      Review of Systems  Allergies  Review of patient's allergies indicates no known allergies.  Home Medications   Current Outpatient Rx  Name  Route  Sig  Dispense  Refill  . lidocaine (XYLOCAINE) 2 % solution   Oral   Take 20 mLs by mouth as needed for pain.         Marland Kitchen Alum & Mag Hydroxide-Simeth (MAGIC MOUTHWASH) SOLN   Oral   Take 10 mLs by mouth every 4 (four) hours as needed.   300 mL   0   . colchicine 0.6 MG tablet   Oral   Take 1 tablet (0.6 mg total) by mouth 2 (two) times daily.   60 tablet   0   . fluconazole (DIFLUCAN) 200 MG tablet   Oral   Take 2 tablets (400 mg total) by mouth daily.   22 tablet   0   . omeprazole (PRILOSEC OTC) 20 MG tablet   Oral   Take 1 tablet (20 mg total) by mouth daily.   15 tablet   0   . oxyCODONE-acetaminophen (PERCOCET) 5-325 MG per tablet   Oral   Take 2 tablets by mouth every 6 (six) hours as needed for pain.   30 tablet   0   . predniSONE (DELTASONE) 20 MG tablet      Take 60 daily for 3 days, 50 mg daily for 3 days, then 40 mg daily for 3 days, then30 mg daily for 3 days, then 20 mg daily for 3 days , then 10 mg daily daily for 3 days then stop   32 tablet   0     BP  120/78  Pulse 61  Temp(Src) 97.4 F (36.3 C) (Oral)  Resp 16  Ht 6' (1.829 m)  Wt 148 lb (67.132 kg)  BMI 20.07 kg/m2  SpO2 100%  Physical Exam  ED Course  Procedures (including critical care time)  Labs Reviewed  CBC WITH DIFFERENTIAL - Abnormal; Notable for the following:    Monocytes Relative 13 (*)    All other components within normal limits  COMPREHENSIVE METABOLIC PANEL - Abnormal; Notable for the following:    Potassium 3.3 (*)    Albumin 3.4 (*)    All other components within normal limits  CBC - Abnormal; Notable for the following:    RBC 4.09 (*)    Hemoglobin 12.3 (*)    HCT 35.7 (*)    All other components within normal limits  BASIC METABOLIC PANEL - Abnormal; Notable for the following:    Glucose, Bld 68 (*)    All other components within normal limits  T-HELPER CELLS (CD4)  COUNT - Abnormal; Notable for the following:    CD4 T Cell Abs 110 (*)    CD4 % Helper T Cell 21 (*)    All other components within normal limits  IGG, IGA, IGM - Abnormal; Notable for the following:    IgG (Immunoglobin G), Serum 2030 (*)    IgA 571 (*)    All other components within normal limits  POCT I-STAT, CHEM 8 - Abnormal; Notable for the following:    Calcium, Ion 1.03 (*)    All other components within normal limits  MRSA PCR SCREENING  HIV ANTIBODY (ROUTINE TESTING)  VITAMIN B12  HIV 1 RNA QUANT-NO REFLEX-BLD  MISCELLANEOUS TEST  IGE  MPO/PR-3 (ANCA) ANTIBODIES  SURGICAL PATHOLOGY   No results found.   1. Stomatitis and mucositis   2. Hypokalemia   3. Herpes simplex infection   4. Stomatitis, viral   5. Thrush   6. Protein-calorie malnutrition, severe   7. Aphthous ulcer of mouth       MDM           Arman Filter, NP 03/26/13 2055

## 2013-03-27 NOTE — ED Provider Notes (Signed)
Medical screening examination/treatment/procedure(s) were performed by non-physician practitioner and as supervising physician I was immediately available for consultation/collaboration.  Raeford Razor, MD 03/27/13 [phone]

## 2013-03-29 NOTE — ED Provider Notes (Signed)
History     CSN: [id number]  Arrival date & time 03/11/13  2100   First MD Initiated Contact with Patient 03/11/13 2218      Chief Complaint  Patient presents with  . mouth sores     (Consider location/radiation/quality/duration/timing/severity/associated sxs/prior treatment) HPI Comments: Mouth sores for 3 months not getting better with treatment   The history is provided by the patient.    Past Medical History  Diagnosis Date  . Mouth ulcers   . MRSA (methicillin resistant staph aureus) culture positive   . Herpes     History reviewed. No pertinent past surgical history.  Family History  Problem Relation Age of Onset  . Heart disease Mother     History  Substance Use Topics  . Smoking status: Current Every Day Smoker -- 0.25 packs/day    Types: Cigarettes  . Smokeless tobacco: Never Used  . Alcohol Use: Yes     Comment: occ      Review of Systems  HENT: Positive for drooling and mouth sores.   All other systems reviewed and are negative.    Allergies  Review of patient's allergies indicates no known allergies.  Home Medications   Current Outpatient Rx  Name  Route  Sig  Dispense  Refill  . lidocaine (XYLOCAINE) 2 % solution   Oral   Take 20 mLs by mouth as needed for pain.         Marland Kitchen Alum & Mag Hydroxide-Simeth (MAGIC MOUTHWASH) SOLN   Oral   Take 10 mLs by mouth every 4 (four) hours as needed.   300 mL   0   . colchicine 0.6 MG tablet   Oral   Take 1 tablet (0.6 mg total) by mouth 2 (two) times daily.   60 tablet   0   . fluconazole (DIFLUCAN) 200 MG tablet   Oral   Take 2 tablets (400 mg total) by mouth daily.   22 tablet   0   . omeprazole (PRILOSEC OTC) 20 MG tablet   Oral   Take 1 tablet (20 mg total) by mouth daily.   15 tablet   0   . oxyCODONE-acetaminophen (PERCOCET) 5-325 MG per tablet   Oral   Take 2 tablets by mouth every 6 (six) hours as needed for pain.   30 tablet   0   . predniSONE (DELTASONE) 20 MG tablet      Take 60 daily for 3 days, 50 mg daily for 3 days, then 40 mg daily for 3 days, then30 mg daily for 3 days, then 20 mg daily for 3 days , then 10 mg daily daily for 3 days then stop   32 tablet   0     BP 120/78  Pulse 61  Temp(Src) 97.4 F (36.3 C) (Oral)  Resp 16  Ht 6' (1.829 m)  Wt 148 lb (67.132 kg)  BMI 20.07 kg/m2  SpO2 100%  Physical Exam  Nursing note and vitals reviewed. Constitutional: He is oriented to person, place, and time. He appears well-developed and well-nourished.  HENT:  Mouth/Throat: Mucous membranes are dry.  Oral lesions including lips tongue and pallette  Eyes: Pupils are equal, round, and reactive to light.  Cardiovascular: Regular rhythm.   Pulmonary/Chest: Effort normal and breath sounds normal.  Abdominal: Soft. Bowel sounds are normal.  Musculoskeletal: Normal range of motion.  Neurological: He is alert and oriented to person, place, and time.  Skin: Skin is warm.    ED Course  Procedures (including critical care time)  Labs Reviewed  CBC WITH DIFFERENTIAL - Abnormal; Notable for the following:    Monocytes Relative 13 (*)    All other components within normal limits  COMPREHENSIVE METABOLIC PANEL - Abnormal; Notable for the following:    Potassium 3.3 (*)    Albumin 3.4 (*)    All other components within normal limits  CBC - Abnormal; Notable for the following:    RBC 4.09 (*)    Hemoglobin 12.3 (*)    HCT 35.7 (*)    All other components within normal limits  BASIC METABOLIC PANEL - Abnormal; Notable for the following:    Glucose, Bld 68 (*)    All other components within normal limits  T-HELPER CELLS (CD4) COUNT - Abnormal; Notable for the following:    CD4 T Cell Abs 110 (*)    CD4 % Helper T Cell 21 (*)    All other components within normal limits  IGG, IGA, IGM - Abnormal; Notable for the following:    IgG (Immunoglobin G), Serum 2030 (*)    IgA 571 (*)    All other components within normal limits  POCT I-STAT, CHEM 8 -  Abnormal; Notable for the following:    Calcium, Ion 1.03 (*)    All other components within normal limits  MRSA PCR SCREENING  HIV ANTIBODY (ROUTINE TESTING)  VITAMIN B12  HIV 1 RNA QUANT-NO REFLEX-BLD  MISCELLANEOUS TEST  IGE  MPO/PR-3 (ANCA) ANTIBODIES  SURGICAL PATHOLOGY   No results found.   1. Stomatitis and mucositis   2. Hypokalemia   3. Herpes simplex infection   4. Stomatitis, viral   5. Thrush   6. Protein-calorie malnutrition, severe   7. Aphthous ulcer of mouth       MDM   Admit for hydration         Arman Filter, NP 03/29/13 2007

## 2013-04-03 NOTE — ED Provider Notes (Signed)
Medical screening examination/treatment/procedure(s) were performed by non-physician practitioner and as supervising physician I was immediately available for consultation/collaboration.  Tagen Brethauer, MD 04/03/13 0657 

## 2013-05-16 ENCOUNTER — Encounter (HOSPITAL_COMMUNITY): Payer: Self-pay | Admitting: Emergency Medicine

## 2013-05-16 ENCOUNTER — Emergency Department (HOSPITAL_COMMUNITY)
Admission: EM | Admit: 2013-05-16 | Discharge: 2013-05-17 | Disposition: A | Discharge status: Home / Self Care | Payer: Medicaid Other | Attending: Emergency Medicine | Admitting: Emergency Medicine

## 2013-05-16 DIAGNOSIS — Z79899 Other long term (current) drug therapy: Secondary | ICD-10-CM | POA: Insufficient documentation

## 2013-05-16 DIAGNOSIS — K121 Other forms of stomatitis: Secondary | ICD-10-CM | POA: Insufficient documentation

## 2013-05-16 DIAGNOSIS — R000 Tachycardia, unspecified: Secondary | ICD-10-CM | POA: Insufficient documentation

## 2013-05-16 DIAGNOSIS — R42 Dizziness and giddiness: Secondary | ICD-10-CM | POA: Insufficient documentation

## 2013-05-16 DIAGNOSIS — R5381 Other malaise: Secondary | ICD-10-CM | POA: Insufficient documentation

## 2013-05-16 DIAGNOSIS — Z22322 Carrier or suspected carrier of Methicillin resistant Staphylococcus aureus: Secondary | ICD-10-CM | POA: Insufficient documentation

## 2013-05-16 DIAGNOSIS — R6883 Chills (without fever): Secondary | ICD-10-CM | POA: Insufficient documentation

## 2013-05-16 DIAGNOSIS — Z8619 Personal history of other infectious and parasitic diseases: Secondary | ICD-10-CM | POA: Insufficient documentation

## 2013-05-16 DIAGNOSIS — F17200 Nicotine dependence, unspecified, uncomplicated: Secondary | ICD-10-CM | POA: Insufficient documentation

## 2013-05-16 LAB — CBC WITH DIFFERENTIAL/PLATELET
Basophils Absolute: 0 10*3/uL (ref 0.0–0.1)
Basophils Relative: 0 % (ref 0–1)
Eosinophils Absolute: 0.1 10*3/uL (ref 0.0–0.7)
Eosinophils Relative: 2 % (ref 0–5)
HCT: 45.4 % (ref 39.0–52.0)
Lymphocytes Relative: 26 % (ref 12–46)
MCH: 31.5 pg (ref 26.0–34.0)
MCHC: 35.9 g/dL (ref 30.0–36.0)
MCV: 87.8 fL (ref 78.0–100.0)
Monocytes Absolute: 1.1 10*3/uL — ABNORMAL HIGH (ref 0.1–1.0)
Platelets: 165 10*3/uL (ref 150–400)
RDW: 12.1 % (ref 11.5–15.5)

## 2013-05-16 LAB — BASIC METABOLIC PANEL
CO2: 25 mEq/L (ref 19–32)
Calcium: 9.7 mg/dL (ref 8.4–10.5)
Creatinine, Ser: 1.13 mg/dL (ref 0.50–1.35)
GFR calc non Af Amer: 90 mL/min — ABNORMAL LOW (ref >90)
Sodium: 137 mEq/L (ref 135–145)

## 2013-05-16 MED ORDER — OXYCODONE-ACETAMINOPHEN 5-325 MG PO TABS: 1.0000 | ORAL_TABLET | Freq: Four times a day (QID) | ORAL | Status: DC | PRN

## 2013-05-16 MED ORDER — HYDROMORPHONE HCL PF 1 MG/ML IJ SOLN
1.0000 mg | Freq: Once | INTRAMUSCULAR | Status: AC
  Administered 2013-05-16: 1 mg via INTRAVENOUS
  Filled 2013-05-16: qty 1

## 2013-05-16 MED ORDER — SODIUM CHLORIDE 0.9 % IV BOLUS (SEPSIS)
1000.0000 mL | Freq: Once | INTRAVENOUS | Status: AC
  Administered 2013-05-16: 1000 mL via INTRAVENOUS

## 2013-05-16 NOTE — ED Provider Notes (Signed)
CSN: [id number]     Arrival date & time 05/16/13  2054 History     First MD Initiated Contact with Patient 05/16/13 2103     Chief Complaint  Patient presents with  . Mouth Lesions   (Consider location/radiation/quality/duration/timing/severity/associated sxs/prior Treatment) HPI Comments: 22 year old male with a past medical history of stomatitis, mouth ulcers and herpes presents emergency department with his significant other complaining of worsening mouth sores and ulcers over the past 4 days. Patient is currently under treatment by infectious disease specialist at [facility], no specific diagnosis has been found reason behind his mouth ulcers, has followup on August 15. He occasionally has flareups of his ulcers causing severe pain to the point where he cannot eat. He has not had anything to eat or drink for the past 3 days. He is beginning to feel weak and lightheaded per his significant other. Admits to associated chills without fever. Pain rated 8/10. He uses mouthwash for ulcers which is only providing slight relief. [facility] to come off of the objects because they state that the issue is probably not related to herpes and Valtrex has made no change.  Patient is a 22 y.o. male presenting with mouth sores. The history is provided by the patient and a significant other.  Mouth Lesions Associated symptoms: no fever     Past Medical History  Diagnosis Date  . Mouth ulcers   . MRSA (methicillin resistant staph aureus) culture positive   . Herpes    History reviewed. No pertinent past surgical history. Family History  Problem Relation Age of Onset  . Heart disease Mother    History  Substance Use Topics  . Smoking status: Current Every Day Smoker -- 0.25 packs/day    Types: Cigarettes  . Smokeless tobacco: Never Used  . Alcohol Use: Yes     Comment: occ    Review of Systems  Constitutional: Positive for chills. Negative for fever.  HENT: Positive for mouth sores.    Neurological: Positive for weakness and light-headedness.  All other systems reviewed and are negative.    Allergies  Review of patient's allergies indicates no known allergies.  Home Medications   Current Outpatient Rx  Name  Route  Sig  Dispense  Refill  . Alum & Mag Hydroxide-Simeth (MAGIC MOUTHWASH) SOLN   Oral   Take 10 mLs by mouth every 4 (four) hours as needed.   300 mL   0   . colchicine 0.6 MG tablet   Oral   Take 1 tablet (0.6 mg total) by mouth 2 (two) times daily.   60 tablet   0   . fluconazole (DIFLUCAN) 200 MG tablet   Oral   Take 2 tablets (400 mg total) by mouth daily.   22 tablet   0   . lidocaine (XYLOCAINE) 2 % solution   Oral   Take 20 mLs by mouth as needed for pain.         Marland Kitchen omeprazole (PRILOSEC OTC) 20 MG tablet   Oral   Take 1 tablet (20 mg total) by mouth daily.   15 tablet   0   . oxyCODONE-acetaminophen (PERCOCET) 5-325 MG per tablet   Oral   Take 2 tablets by mouth every 6 (six) hours as needed for pain.   30 tablet   0   . predniSONE (DELTASONE) 20 MG tablet      Take 60 daily for 3 days, 50 mg daily for 3 days, then 40 mg daily for 3  days, then30 mg daily for 3 days, then 20 mg daily for 3 days , then 10 mg daily daily for 3 days then stop   32 tablet   0    BP 128/62  Pulse 111  Temp(Src) 98.6 F (37 C) (Oral)  Resp 20  SpO2 100% Physical Exam  Nursing note and vitals reviewed. Constitutional: He is oriented to person, place, and time. He appears well-developed and well-nourished. No distress.  Suctioning saliva with Yankauer.   HENT:  Head: Normocephalic and atraumatic.  Thick white coating over tongue, superficial ulceration 2mm on lower lip, some crusting, pain with opening mouth.   Eyes: Conjunctivae and EOM are normal. Pupils are equal, round, and reactive to light.  Neck: Normal range of motion. Neck supple.  Cardiovascular: Regular rhythm, normal heart sounds and intact distal pulses.  Tachycardia  present.   Pulmonary/Chest: Effort normal and breath sounds normal. No respiratory distress.  Abdominal: Soft. Bowel sounds are normal. There is no tenderness.  Musculoskeletal: Normal range of motion. He exhibits no edema.  Neurological: He is alert and oriented to person, place, and time.  Skin: Skin is warm and dry. No rash noted. He is not diaphoretic.  Psychiatric: He has a normal mood and affect. His behavior is normal.    ED Course   Procedures (including critical care time)  Labs Reviewed  CBC WITH DIFFERENTIAL - Abnormal; Notable for the following:    Monocytes Relative 19 (*)    Monocytes Absolute 1.1 (*)    All other components within normal limits  BASIC METABOLIC PANEL - Abnormal; Notable for the following:    GFR calc non Af Amer 90 (*)    All other components within normal limits   No results found. 1. Stomatitis     MDM  Patient with mouth lesions, hx of same, under care of ID at [facility]. Has flare-ups where he has severe pain and cannot eat. Will give fluids, check basic labs cbc, bmp, along with pain control with dilaudid. If he is able to eventually tolerate PO, will discharge home. 10:48 PM Pain started to improve with dilaudid, however returned 5 minutes ago. Will give another dose, try PO fluids. 11:35 PM Labs unremarkable. He is tolerating PO fluids. Pain improved at this time, but concerned for "when it wears off". He is stable for discharge. Afebrile, NAD. Rx for percocet. F/u with specialist at [facility]. Return precautions discussed. Patient states understanding of treatment care plan and is agreeable.   Trevor Mace, PA-C 05/16/13 2337

## 2013-05-16 NOTE — ED Provider Notes (Signed)
Medical screening examination/treatment/procedure(s) were performed by non-physician practitioner and as supervising physician I was immediately available for consultation/collaboration.   Richardean Canal, MD 05/16/13 [phone]

## 2013-05-16 NOTE — ED Notes (Addendum)
Came in per POV with complaint of mouth sores/ ulcers which started  Monday, pain at 8/10. Claimed that  He gets mouth ulcers on and off usually in about 3 months apart. Pt. Was admitted  Last June on the same problem. Pt. Also reported of not eating / drinking for the last 3 days due to pain.

## 2013-05-20 ENCOUNTER — Encounter (HOSPITAL_COMMUNITY): Payer: Self-pay | Admitting: Emergency Medicine

## 2013-05-20 ENCOUNTER — Emergency Department (HOSPITAL_COMMUNITY)
Admission: EM | Admit: 2013-05-20 | Discharge: 2013-05-21 | Disposition: A | Discharge status: Home / Self Care | Payer: Medicaid Other | Attending: Emergency Medicine | Admitting: Emergency Medicine

## 2013-05-20 DIAGNOSIS — F17200 Nicotine dependence, unspecified, uncomplicated: Secondary | ICD-10-CM | POA: Insufficient documentation

## 2013-05-20 DIAGNOSIS — K1230 Oral mucositis (ulcerative), unspecified: Secondary | ICD-10-CM

## 2013-05-20 DIAGNOSIS — R000 Tachycardia, unspecified: Secondary | ICD-10-CM | POA: Insufficient documentation

## 2013-05-20 DIAGNOSIS — K121 Other forms of stomatitis: Secondary | ICD-10-CM | POA: Insufficient documentation

## 2013-05-20 DIAGNOSIS — Z8619 Personal history of other infectious and parasitic diseases: Secondary | ICD-10-CM | POA: Insufficient documentation

## 2013-05-20 DIAGNOSIS — R6883 Chills (without fever): Secondary | ICD-10-CM | POA: Insufficient documentation

## 2013-05-20 DIAGNOSIS — Z8719 Personal history of other diseases of the digestive system: Secondary | ICD-10-CM | POA: Insufficient documentation

## 2013-05-20 DIAGNOSIS — Z8614 Personal history of Methicillin resistant Staphylococcus aureus infection: Secondary | ICD-10-CM | POA: Insufficient documentation

## 2013-05-20 MED ORDER — HYDROMORPHONE HCL PF 1 MG/ML IJ SOLN
1.0000 mg | Freq: Once | INTRAMUSCULAR | Status: AC
  Administered 2013-05-20: 1 mg via INTRAVENOUS
  Filled 2013-05-20: qty 1

## 2013-05-20 MED ORDER — SODIUM CHLORIDE 0.9 % IV BOLUS (SEPSIS)
1000.0000 mL | Freq: Once | INTRAVENOUS | Status: AC
  Administered 2013-05-20: 1000 mL via INTRAVENOUS

## 2013-05-20 NOTE — ED Provider Notes (Signed)
CSN: [id number]     Arrival date & time 05/20/13  2030 History     First MD Initiated Contact with Patient 05/20/13 2037     Chief Complaint  Patient presents with  . Mouth Lesions   (Consider location/radiation/quality/duration/timing/severity/associated sxs/prior Treatment) HPI Comments: 22 year old male with a past medical history of stomatitis, mouth ulcers presents to the emergency department complaining of worsening mouth sores and ulcers over the past week. Patient is currently under treatment by infectious disease specialist at [facility], no specific diagnosis has been found reason behind his mouth ulcers, has followup on August 15. He occasionally has flareups of his ulcers causing severe pain to the point where he cannot eat. Initially he stated that he has not been able to eat or drink anything in the past six days. However, he states that he is able to drink a small amount of fluid after he takes his Percocet.  He has been taking Percocet for the pain, but has not been using the magic mouthwash because he does not feel that it helps.  He denies dizziness or lightheadedness at this time.  Admits to associated chills without fever.   The history is provided by the patient.    Past Medical History  Diagnosis Date  . Mouth ulcers   . MRSA (methicillin resistant staph aureus) culture positive   . Herpes    History reviewed. No pertinent past surgical history. Family History  Problem Relation Age of Onset  . Heart disease Mother    History  Substance Use Topics  . Smoking status: Current Every Day Smoker -- 0.25 packs/day    Types: Cigarettes  . Smokeless tobacco: Never Used  . Alcohol Use: Yes     Comment: occ    Review of Systems  Constitutional: Positive for chills.  HENT: Positive for mouth sores. Negative for trouble swallowing.        Mouth lesions  All other systems reviewed and are negative.    Allergies  Review of patient's allergies indicates no known  allergies.  Home Medications   Current Outpatient Rx  Name  Route  Sig  Dispense  Refill  . Alum & Mag Hydroxide-Simeth (MAGIC MOUTHWASH) SOLN   Oral   Take 10 mLs by mouth every 4 (four) hours as needed (mouth sores/pain).         Marland Kitchen oxyCODONE-acetaminophen (PERCOCET) 5-325 MG per tablet   Oral   Take 1-2 tablets by mouth every 6 (six) hours as needed for pain.   20 tablet   0    BP 133/81  Pulse 92  Temp(Src) 98.7 F (37.1 C) (Axillary)  Resp 20  SpO2 99% Physical Exam  Nursing note and vitals reviewed. Constitutional: He appears well-developed and well-nourished. No distress.  HENT:  Head: Normocephalic and atraumatic.  Mouth/Throat: Uvula is midline. Oral lesions present. No trismus in the jaw.  Thick white coating over tongue, superficial ulceration 2mm on lower lip, some crusting.  Severe halitosis.    Neck: Normal range of motion. Neck supple.  Cardiovascular: Regular rhythm and normal heart sounds.  Tachycardia present.   Pulmonary/Chest: Effort normal and breath sounds normal.  Neurological: He is alert.  Skin: Skin is warm and dry. He is not diaphoretic.  Psychiatric: He has a normal mood and affect.    ED Course   Procedures (including critical care time)  Labs Reviewed - No data to display No results found. No diagnosis found.  Patient able to tolerate PO liquids.  He  states that his pain has improved.  MDM  Patient presenting with lesions of his mouth.  He has a history of Stomatitis and is currently followed by ID at [facility].  He has a follow up appointment scheduled in two days.  Patient is currently afebrile.  Pain improved after Dilaudid.  Patient tolerating PO liquids.  Therefore, feel that the patient can be discharged.  Return precautions given.  Pascal Lux [place]et, PA-C 05/27/13 1606

## 2013-05-20 NOTE — ED Notes (Signed)
Pt BIB father. Pt has lesions to lips and mouth and circumoral swelling.  Pt hasn't eaten or drank anything in 6 days per father. Pt's father stated that when this happened before pt had to be hospitalized. Pt with no acute distress. Able to swallow own secretions.

## 2013-05-21 MED ORDER — MAGIC MOUTHWASH W/LIDOCAINE: 5.0000 mL | Freq: Four times a day (QID) | ORAL | Status: DC | PRN

## 2013-05-21 MED ORDER — HYDROMORPHONE HCL PF 1 MG/ML IJ SOLN
1.0000 mg | Freq: Once | INTRAMUSCULAR | Status: AC
  Administered 2013-05-21: 1 mg via INTRAVENOUS
  Filled 2013-05-21: qty 1

## 2013-05-21 MED ORDER — OXYCODONE-ACETAMINOPHEN 5-325 MG PO TABS
1.0000 | ORAL_TABLET | Freq: Four times a day (QID) | ORAL | Status: DC | PRN
Start: 2013-05-21 — End: 2013-09-27

## 2013-05-30 NOTE — ED Provider Notes (Signed)
Medical screening examination/treatment/procedure(s) were performed by non-physician practitioner and as supervising physician I was immediately available for consultation/collaboration.   Benny Lennert, MD 05/30/13 [phone]

## 2013-09-23 ENCOUNTER — Emergency Department (HOSPITAL_BASED_OUTPATIENT_CLINIC_OR_DEPARTMENT_OTHER)
Admission: EM | Admit: 2013-09-23 | Discharge: 2013-09-23 | Disposition: A | Discharge status: Home / Self Care | Payer: PRIVATE HEALTH INSURANCE | Attending: Emergency Medicine | Admitting: Emergency Medicine

## 2013-09-23 ENCOUNTER — Encounter (HOSPITAL_BASED_OUTPATIENT_CLINIC_OR_DEPARTMENT_OTHER): Payer: Self-pay | Admitting: Emergency Medicine

## 2013-09-23 DIAGNOSIS — F17200 Nicotine dependence, unspecified, uncomplicated: Secondary | ICD-10-CM | POA: Insufficient documentation

## 2013-09-23 DIAGNOSIS — Z79899 Other long term (current) drug therapy: Secondary | ICD-10-CM | POA: Insufficient documentation

## 2013-09-23 DIAGNOSIS — Z8619 Personal history of other infectious and parasitic diseases: Secondary | ICD-10-CM | POA: Insufficient documentation

## 2013-09-23 DIAGNOSIS — K121 Other forms of stomatitis: Secondary | ICD-10-CM | POA: Insufficient documentation

## 2013-09-23 DIAGNOSIS — Z8614 Personal history of Methicillin resistant Staphylococcus aureus infection: Secondary | ICD-10-CM | POA: Insufficient documentation

## 2013-09-23 MED ORDER — HYDROMORPHONE HCL PF 2 MG/ML IJ SOLN
2.0000 mg | Freq: Once | INTRAMUSCULAR | Status: AC
  Administered 2013-09-23: 2 mg via INTRAMUSCULAR
  Filled 2013-09-23: qty 1

## 2013-09-23 MED ORDER — NYSTATIN 100000 UNIT/ML MT SUSP: 500000.0000 [IU] | Freq: Four times a day (QID) | OROMUCOSAL | Status: DC

## 2013-09-23 MED ORDER — MAGIC MOUTHWASH: 5.0000 mL | Freq: Three times a day (TID) | ORAL | Status: DC | PRN

## 2013-09-23 MED ORDER — OXYCODONE-ACETAMINOPHEN 5-325 MG PO TABS: 2.0000 | ORAL_TABLET | ORAL | Status: DC | PRN

## 2013-09-23 NOTE — ED Notes (Signed)
MD at bedside. 

## 2013-09-23 NOTE — ED Notes (Signed)
Lesions in mouth x8 days.  Sts he has been having recurrent issues with same issue since age 22.  Sts he has had tx and biopsies but still do not know what it is.  Herpes tests were negative.

## 2013-09-23 NOTE — ED Provider Notes (Signed)
CSN: [id number]     Arrival date & time 09/23/13  1737 History  This chart was scribed for Paul Bucco, MD by Landis Gandy, ED Scribe. This patient was seen in room MH09/MH09 and the patient's care was started at 6:50 PM     Chief Complaint  Patient presents with  . Mouth Lesions   The history is provided by the patient. No language interpreter was used.   HPI Comments: Paul House is a 22 y.o. male who presents to the Emergency Department complaining of lesions in mouth cheeks and tongue, that began at the age of 18. Pt states that these lesions come back every three to four months and is unable to drink. Previous Herpes test results are negative. Pt states that he uses Magic mouthwash to relieve symptoms, but does not like this method because it alters his taste in food. Pt denies any fever or vomiting, chest pain, SOB,  and diarrhea.   PCP- None   Past Medical History  Diagnosis Date  . Mouth ulcers   . MRSA (methicillin resistant staph aureus) culture positive   . Herpes    History reviewed. No pertinent past surgical history. Family History  Problem Relation Age of Onset  . Heart disease Mother    History  Substance Use Topics  . Smoking status: Current Every Day Smoker -- 0.25 packs/day    Types: Cigarettes  . Smokeless tobacco: Never Used  . Alcohol Use: Yes     Comment: occ    Review of Systems  Constitutional: Negative for fever, chills, diaphoresis and fatigue.  HENT: Positive for mouth sores. Negative for congestion, rhinorrhea and sneezing.   Eyes: Negative.   Respiratory: Negative for cough, chest tightness and shortness of breath.   Cardiovascular: Negative for chest pain and leg swelling.  Gastrointestinal: Negative for nausea, vomiting, abdominal pain, diarrhea and blood in stool.  Genitourinary: Negative for frequency, hematuria, flank pain and difficulty urinating.  Musculoskeletal: Negative for arthralgias and back pain.  Skin: Negative  for rash.  Neurological: Negative for dizziness, speech difficulty, weakness, numbness and headaches.    Allergies  Review of patient's allergies indicates no known allergies.  Home Medications   Current Outpatient Rx  Name  Route  Sig  Dispense  Refill  . Alum & Mag Hydroxide-Simeth (MAGIC MOUTHWASH W/LIDOCAINE) SOLN   Oral   Take 5 mLs by mouth 4 (four) times daily as needed.   50 mL   0   . Alum & Mag Hydroxide-Simeth (MAGIC MOUTHWASH) SOLN   Oral   Take 10 mLs by mouth every 4 (four) hours as needed (mouth sores/pain).         . Alum & Mag Hydroxide-Simeth (MAGIC MOUTHWASH) SOLN   Oral   Take 5 mLs by mouth 3 (three) times daily as needed for mouth pain.   45 mL   0   . nystatin (MYCOSTATIN) 100000 UNIT/ML suspension   Oral   Take 5 mLs (500,000 Units total) by mouth 4 (four) times daily.   60 mL   0   . oxyCODONE-acetaminophen (PERCOCET) 5-325 MG per tablet   Oral   Take 1-2 tablets by mouth every 6 (six) hours as needed for pain.   20 tablet   0   . oxyCODONE-acetaminophen (PERCOCET) 5-325 MG per tablet   Oral   Take 2 tablets by mouth every 4 (four) hours as needed.   20 tablet   0   . oxyCODONE-acetaminophen (PERCOCET/ROXICET) 5-325 MG per tablet  Oral   Take 1-2 tablets by mouth every 6 (six) hours as needed for pain.   15 tablet   0    Triage Vitals: BP 126/88  Pulse 112  Temp(Src) 98.9 F (37.2 C) (Oral)  Resp 16  Ht 6' (1.829 m)  Wt 150 lb (68.04 kg)  BMI 20.34 kg/m2  SpO2 98% Physical Exam  Constitutional: He is oriented to person, place, and time. He appears well-developed and well-nourished.  HENT:  Head: Normocephalic and atraumatic.  Patient has a white coating to his tongue and on his pupil mucosa. There's several ulcerated areas on the mucosal surfaces.  Eyes: Pupils are equal, round, and reactive to light.  Neck: Normal range of motion. Neck supple.  Cardiovascular: Normal rate, regular rhythm and normal heart sounds.    Pulmonary/Chest: Effort normal and breath sounds normal. No respiratory distress. He has no wheezes. He has no rales. He exhibits no tenderness.  Abdominal: Soft. Bowel sounds are normal. There is no tenderness. There is no rebound and no guarding.  Musculoskeletal: Normal range of motion. He exhibits no edema.  Lymphadenopathy:    He has no cervical adenopathy.  Neurological: He is alert and oriented to person, place, and time.  Skin: Skin is warm and dry. No rash noted.  Psychiatric: He has a normal mood and affect.    ED Course  Procedures (including critical care time) DIAGNOSTIC STUDIES: Oxygen Saturation is 98% on RA, normal by my interpretation.    COORDINATION OF CARE: 6:59 PM- will give patient Dilaudid in the ED, and discharge with medications. Pt advised of plan for treatment and pt agrees.  Labs Review Labs Reviewed - No data to display Imaging Review No results found.  EKG Interpretation   None       MDM   1. Stomatitis    Patient history of recurrent stomatitis. His herpes tests have been negative. He was previously seen in infectious disease specialist at [facility]. He was given prescription for Magic mouthwash, nystatin swish and swallow and Percocet for pain. He was encouraged to followup with his ID specialist.  I personally performed the services described in this documentation, which was scribed in my presence.  The recorded information has been reviewed and considered.    Paul Bucco, MD 09/23/13 [phone]

## 2013-09-27 ENCOUNTER — Inpatient Hospital Stay (HOSPITAL_COMMUNITY)
Admission: EM | Admit: 2013-09-27 | Discharge: 2013-10-02 | DRG: 545 | Disposition: A | Discharge status: Home / Self Care | Payer: Self-pay | Attending: Internal Medicine | Admitting: Internal Medicine

## 2013-09-27 ENCOUNTER — Encounter (HOSPITAL_COMMUNITY): Payer: Self-pay | Admitting: Emergency Medicine

## 2013-09-27 ENCOUNTER — Emergency Department (HOSPITAL_COMMUNITY): Payer: Self-pay

## 2013-09-27 DIAGNOSIS — F17210 Nicotine dependence, cigarettes, uncomplicated: Secondary | ICD-10-CM

## 2013-09-27 DIAGNOSIS — R300 Dysuria: Secondary | ICD-10-CM | POA: Diagnosis present

## 2013-09-27 DIAGNOSIS — E860 Dehydration: Secondary | ICD-10-CM | POA: Diagnosis present

## 2013-09-27 DIAGNOSIS — J029 Acute pharyngitis, unspecified: Secondary | ICD-10-CM | POA: Diagnosis present

## 2013-09-27 DIAGNOSIS — IMO0002 Reserved for concepts with insufficient information to code with codable children: Secondary | ICD-10-CM

## 2013-09-27 DIAGNOSIS — Z8614 Personal history of Methicillin resistant Staphylococcus aureus infection: Secondary | ICD-10-CM

## 2013-09-27 DIAGNOSIS — H538 Other visual disturbances: Secondary | ICD-10-CM | POA: Diagnosis present

## 2013-09-27 DIAGNOSIS — K121 Other forms of stomatitis: Secondary | ICD-10-CM

## 2013-09-27 DIAGNOSIS — E876 Hypokalemia: Secondary | ICD-10-CM | POA: Diagnosis present

## 2013-09-27 DIAGNOSIS — Z8249 Family history of ischemic heart disease and other diseases of the circulatory system: Secondary | ICD-10-CM

## 2013-09-27 DIAGNOSIS — R7989 Other specified abnormal findings of blood chemistry: Secondary | ICD-10-CM | POA: Diagnosis present

## 2013-09-27 DIAGNOSIS — F17200 Nicotine dependence, unspecified, uncomplicated: Secondary | ICD-10-CM | POA: Diagnosis present

## 2013-09-27 DIAGNOSIS — E43 Unspecified severe protein-calorie malnutrition: Secondary | ICD-10-CM

## 2013-09-27 DIAGNOSIS — R1310 Dysphagia, unspecified: Secondary | ICD-10-CM | POA: Diagnosis present

## 2013-09-27 DIAGNOSIS — K120 Recurrent oral aphthae: Secondary | ICD-10-CM

## 2013-09-27 DIAGNOSIS — M352 Behcet's disease: Principal | ICD-10-CM | POA: Diagnosis present

## 2013-09-27 HISTORY — DX: Behcet's disease: M35.2

## 2013-09-27 LAB — CBC WITH DIFFERENTIAL/PLATELET
Basophils Absolute: 0 10*3/uL (ref 0.0–0.1)
Eosinophils Absolute: 0.3 10*3/uL (ref 0.0–0.7)
Eosinophils Relative: 4 % (ref 0–5)
Lymphocytes Relative: 24 % (ref 12–46)
MCH: 31.2 pg (ref 26.0–34.0)
MCV: 87 fL (ref 78.0–100.0)
Neutrophils Relative %: 56 % (ref 43–77)
Platelets: 199 10*3/uL (ref 150–400)
RDW: 11.6 % (ref 11.5–15.5)
WBC: 6.2 10*3/uL (ref 4.0–10.5)

## 2013-09-27 LAB — BASIC METABOLIC PANEL
Calcium: 10.2 mg/dL (ref 8.4–10.5)
GFR calc non Af Amer: 79 mL/min — ABNORMAL LOW (ref >90)
Potassium: 4.3 mEq/L (ref 3.5–5.1)
Sodium: 139 mEq/L (ref 135–145)

## 2013-09-27 MED ORDER — METHYLPREDNISOLONE SODIUM SUCC 40 MG IJ SOLR
20.0000 mg | Freq: Every day | INTRAMUSCULAR | Status: DC
  Filled 2013-09-27: qty 0.5

## 2013-09-27 MED ORDER — HYDROMORPHONE HCL PF 1 MG/ML IJ SOLN
1.0000 mg | Freq: Once | INTRAMUSCULAR | Status: AC
  Administered 2013-09-27: 1 mg via INTRAVENOUS
  Filled 2013-09-27: qty 1

## 2013-09-27 MED ORDER — ENOXAPARIN SODIUM 40 MG/0.4ML ~~LOC~~ SOLN
40.0000 mg | SUBCUTANEOUS | Status: DC
  Administered 2013-09-27 – 2013-10-01 (×5): 40 mg via SUBCUTANEOUS
  Filled 2013-09-27 (×6): qty 0.4

## 2013-09-27 MED ORDER — SODIUM CHLORIDE 0.9 % IV SOLN
INTRAVENOUS | Status: DC
  Administered 2013-09-27 – 2013-10-01 (×8): via INTRAVENOUS

## 2013-09-27 MED ORDER — BOOST PLUS PO LIQD
237.0000 mL | Freq: Three times a day (TID) | ORAL | Status: DC
  Administered 2013-09-27 – 2013-10-02 (×5): 237 mL via ORAL
  Filled 2013-09-27 (×16): qty 237

## 2013-09-27 MED ORDER — METHYLPREDNISOLONE SODIUM SUCC 40 MG IJ SOLR
40.0000 mg | Freq: Every day | INTRAMUSCULAR | Status: DC
  Administered 2013-09-27 – 2013-10-01 (×5): 40 mg via INTRAVENOUS
  Filled 2013-09-27 (×5): qty 1

## 2013-09-27 MED ORDER — ONDANSETRON HCL 4 MG PO TABS: 4.0000 mg | ORAL_TABLET | Freq: Four times a day (QID) | ORAL | Status: DC | PRN

## 2013-09-27 MED ORDER — HYDROMORPHONE HCL PF 1 MG/ML IJ SOLN
1.0000 mg | INTRAMUSCULAR | Status: DC | PRN
  Administered 2013-09-27 – 2013-10-02 (×22): 1 mg via INTRAVENOUS
  Filled 2013-09-27 (×23): qty 1

## 2013-09-27 MED ORDER — SODIUM CHLORIDE 0.9 % IV BOLUS (SEPSIS)
1000.0000 mL | Freq: Once | INTRAVENOUS | Status: AC
  Administered 2013-09-27: 1000 mL via INTRAVENOUS

## 2013-09-27 MED ORDER — COLCHICINE 0.6 MG PO TABS
0.6000 mg | ORAL_TABLET | Freq: Two times a day (BID) | ORAL | Status: DC
  Administered 2013-09-27 – 2013-10-02 (×10): 0.6 mg via ORAL
  Filled 2013-09-27 (×11): qty 1

## 2013-09-27 MED ORDER — IOHEXOL 300 MG/ML  SOLN
100.0000 mL | Freq: Once | INTRAMUSCULAR | Status: AC | PRN
  Administered 2013-09-27: 100 mL via INTRAVENOUS

## 2013-09-27 MED ORDER — TRIAMCINOLONE ACETONIDE 0.1 % MT PSTE
PASTE | Freq: Three times a day (TID) | OROMUCOSAL | Status: DC
  Administered 2013-09-27 (×2): via OROMUCOSAL
  Administered 2013-09-28: 1 via OROMUCOSAL
  Administered 2013-09-28 – 2013-09-29 (×3): via OROMUCOSAL
  Administered 2013-09-29: 1 via OROMUCOSAL
  Administered 2013-09-30 (×2): via OROMUCOSAL
  Administered 2013-09-30: 1 via OROMUCOSAL
  Administered 2013-10-01 – 2013-10-02 (×4): via OROMUCOSAL
  Filled 2013-09-27 (×2): qty 5

## 2013-09-27 MED ORDER — BISACODYL 10 MG RE SUPP: 10.0000 mg | Freq: Every day | RECTAL | Status: DC | PRN

## 2013-09-27 MED ORDER — ONDANSETRON HCL 4 MG/2ML IJ SOLN
4.0000 mg | Freq: Four times a day (QID) | INTRAMUSCULAR | Status: DC | PRN
  Filled 2013-09-27: qty 2

## 2013-09-27 MED ORDER — LIP MEDEX EX OINT
TOPICAL_OINTMENT | CUTANEOUS | Status: AC
  Administered 2013-09-27: 16:00:00
  Filled 2013-09-27: qty 7

## 2013-09-27 MED ORDER — SUCRALFATE 1 GM/10ML PO SUSP
1.0000 g | Freq: Three times a day (TID) | ORAL | Status: DC
  Administered 2013-09-27 – 2013-10-02 (×20): 1 g via ORAL
  Filled 2013-09-27 (×23): qty 10

## 2013-09-27 NOTE — ED Notes (Signed)
Pt from home reports sore throat for several months. Pt was seen at [facility] 4 days ago, dx with "throat infection". Pt wife reports that pt unable to afford abx and now pt throat is worse, pt unable to talk. Pt is A&O and airway intact.

## 2013-09-27 NOTE — H&P (Addendum)
[facility] History and Physical  Paul House EAV:[id number] DOB: [birth year] DOA: 09/27/2013  Referring physician:  Benjiman Core PCP:  Default, Provider, MD   Chief Complaint:  Sore throat  HPI:  The patient is a 22 y.o. year-old male with history of recurrent oral ulcers requiring hospital admission due to pain and inability to swallow who presents with sore throat.  Paul House has had episodic ulceration of his mouth since he was 22 years old. He has had repeated hospitalizations and extensive workup at [facility] and at [facility]. Initially, he was thought to have recurrent herpes ulcers and was treated with antiviral medications. Antiviral medications do not affect the course of his disease and viral tests including viral PCR and biopsy were negative for viral infection. He is also tested negative for GC and Chlamydia, HIV, fungal infection. Repeat ANA has been negative and ANCA panel was also negative.  He has been followed by infectious disease at [facility] who does not believe that this is an infectious process in they are more concerned about autoimmune disease.  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  The patient was last at their baseline health about two weeks ago.  He developed mouth sores which have gotten progressively worse. It initially was able to eat and drink bed over the last several days he has had difficulty swallowing even his secretions. He indicates that his uvula has become very swollen making it difficult to swallow. He denies difficulty breathing. He has had some associated blurry vision and some scratchiness of his left eye and he endorses some dysuria with similar ulcerations on his genitalia.  He has tried Magic mouth wash which did not help.  In the emergency department, he was initially afebrile 98.7, tachycardic to 140s, blood pressure stable 116/81, breathing comfortably in room air. His labs are notable for an  elevated BUN 29 creatinine 1.26. A CT scan of the soft tissue neck demonstrated mildly prominent enhancing tonsils suggesting tonsillitis without evidence of tonsillar abscess. He was given IV fluids and his heart rate down but he was still unable to even drink.  Review of Systems:  General:  Denies fevers, chills, weight loss or gain HEENT: Blurry vision, denies rhinorrhea, sinus congestion CV:  Denies chest pain and palpitations, lower extremity edema.  PULM:  Denies SOB, wheezing, cough.   GI:  +  nausea, vomiting.  Denies constipation, diarrhea.   GU:  Denies dysuria, frequency, urgency ENDO:  Denies polyuria, polydipsia.   HEME:  Denies hematemesis, blood in stools, melena, abnormal bruising or bleeding.  LYMPH:  Denies lymphadenopathy.   MSK:  Denies arthralgias, myalgias.   DERM:  Per HPI NEURO:  Denies focal numbness, weakness, slurred speech, confusion, facial droop.  PSYCH:  Denies anxiety and depression.    Past Medical History  Diagnosis Date  . Behcet's disease   . MRSA (methicillin resistant staph aureus) culture positive   . Herpes    Past Surgical History  Procedure Laterality Date  . Biopsy pharynx     Social History:  reports that he has been smoking Cigarettes.  He has been smoking about 0.25 packs per day. He has never used smokeless tobacco. He reports that he drinks alcohol. He reports that he does not use illicit drugs. Lives with his wife, difficulty holding a job secondary to recurrent mouth ulcers  No Known Allergies  Family History  Problem Relation Age of Onset  . Heart disease Mother   . Lupus Neg Hx   .  Crohn's disease Neg Hx      Prior to Admission medications   Medication Sig Start Date End Date Taking? Authorizing Provider  Alum & Mag Hydroxide-Simeth (MAGIC MOUTHWASH W/LIDOCAINE) SOLN Take 5 mLs by mouth 4 (four) times daily as needed. 05/21/13  Yes Santiago Glad, PA-C   Physical Exam: Filed Vitals:   09/27/13 1056 09/27/13 1118 09/27/13  1508 09/27/13 1542  BP: 112/83 107/56 116/81 113/80  Pulse: 136 91 97 97  Temp: 98.7 F (37.1 C)   98.3 F (36.8 C)  TempSrc: Oral   Axillary  Resp: 18     Height:    6' (1.829 m)  Weight:    65.635 kg (144 lb 11.2 oz)  SpO2: 99% 96% 97% 97%     General:  Adult male, NAD, difficulty speaking secondary to oral ulcers  Eyes:  PERRL, anicteric, left eye somewhat injected  ENT:  Nares clear, lips with blood-crusted ulcers and very swollen.  Tongue and hard palate epithelium appears white and almost like a chemical burn, areas are pulling away with erythematous tissues at base.  Tongue is mildly swollen and coated with soft white that is more filmy-appearing than thrush.    Neck:  Supple without TM or JVD.    Lymph:  No cervical, supraclavicular, or submandibular LAD.  Cardiovascular:  RRR, normal S1, S2, without m/r/g.  2+ pulses, warm extremities  Respiratory:  CTA bilaterally without increased WOB.  Abdomen:  NABS.  Soft, ND/NT.    Skin:  No rashes or focal lesions.  Musculoskeletal:  Normal bulk and tone.  No LE edema.  Psychiatric:  A & O x 4.  Appropriate affect.  Neurologic:  CN 3-12 intact.  5/5 strength.  Sensation intact.  Labs on Admission:  Basic Metabolic Panel:  Recent Labs Lab 09/27/13 1130  NA 139  K 4.3  CL 99  CO2 24  GLUCOSE 110*  BUN 29*  CREATININE 1.26  CALCIUM 10.2   Liver Function Tests: No results found for this basename: AST, ALT, ALKPHOS, BILITOT, PROT, ALBUMIN,  in the last 168 hours No results found for this basename: LIPASE, AMYLASE,  in the last 168 hours No results found for this basename: AMMONIA,  in the last 168 hours CBC:  Recent Labs Lab 09/27/13 1130  WBC 6.2  NEUTROABS 3.5  HGB 16.8  HCT 46.9  MCV 87.0  PLT 199   Cardiac Enzymes: No results found for this basename: CKTOTAL, CKMB, CKMBINDEX, TROPONINI,  in the last 168 hours  BNP (last 3 results) No results found for this basename: PROBNP,  in the last 8760  hours CBG: No results found for this basename: GLUCAP,  in the last 168 hours  Radiological Exams on Admission: Ct Soft Tissue Neck W Contrast  09/27/2013   CLINICAL DATA:  Sore throat, worsening.  EXAM: CT NECK WITH CONTRAST  TECHNIQUE: Multidetector CT imaging of the neck was performed using the standard protocol following the bolus administration of intravenous contrast.  CONTRAST:  OMNIPAQUE IOHEXOL 300 MG/ML  SOLN  COMPARISON:  None.  FINDINGS: Epiglottis and aryepiglottic folds are normal. Airways patent. Mild prominence and enhancement of the tonsils suggesting the possibility of tonsillitis. Recommend clinical correlation. No focal fluid collection to suggest tonsillar or peritonsillar abscess. There are mildly prominent bilateral cervical and submandibular lymph nodes, presumably reactive.  Thyroid, submandibular and parotid glands are symmetric and unremarkable. Vascular structures widely patent. Visualized upper lung fields are clear.  No acute bony abnormality.  IMPRESSION: Mildly  prominent and enhancing tonsils suggesting tonsillitis. No evidence of tonsillar abscess. Epiglottis and aryepiglottic folds are normal.   Electronically Signed   By: Charlett Nose M.D.   On: 09/27/2013 13:55    Assessment/Plan Active Problems:   Stomatitis and mucositis  ---  Stomatitis and mucositis, ddx includes Crohn's disease, however, most likely this reflects Behcet's disease with severe oral aphthous ulceration.   -  Start solumedrol 40mg  IV daily until tolerating PO, then transition to oral steroids to taper over two weeks to 5 or 10mg  per day until able to follow up with rheumatology -  Triamcinolone oral cream -  Carafate to coat -  Dilaudid IV q2h prn pain -  Magic mouthwash was not helping -  Start colchicine once able to tolerate PO -  Needs referral to ophthalmology and GI, the former to evaluate for uveitis and vasculitis, the latter to rule out Crohn's (ANCA was neg)  No health  insurance -  SW consult to instruct patient on applying to medicaid -  Firefighter -  Case manager for assistance with medications at discharge  Dehydration suggested by mild elevation of creatinine and BUN:Cr > 20 -  IVF  Severe protein calorie malnutrition secondary to oral ulcerations -  Boost tid -  Advance diet as quickly as tolerated  Tobacco use, advised cessation -  Nurse to provide counseling against smoking  Diet:  CLD Access:  PIV IVF:  yes Proph:  lovenox  Code Status: full Family Communication: patient and his wife Disposition Plan: Admit to med-surg  Time spent: 60 min Renae Fickle [facility] Pager [phone]  If 7PM-7AM, please contact night-coverage [email or web address] Password [email or web address] 09/27/2013, 4:20 PM

## 2013-09-27 NOTE — ED Provider Notes (Signed)
CSN: [id number]     Arrival date & time 09/27/13  1044 History   First MD Initiated Contact with Patient 09/27/13 1106     Chief Complaint  Patient presents with  . Sore Throat   (Consider location/radiation/quality/duration/timing/severity/associated sxs/prior Treatment) Patient is a 22 y.o. male presenting with pharyngitis. The history is provided by the patient.  Sore Throat This is a recurrent problem. Pertinent negatives include no chest pain, no abdominal pain, no headaches and no shortness of breath.   patient has recurrent oral ulcers. No clear diagnosis has been given, although it appears that herpes has been ruled out. He gets them a few months and has required hospitalizations in the past. His been flaring up for the last 10 days. Has gotten worse last couple days and is having difficulty swallowing. He draws a picture that showed his uvula area and states that it is swollen there. No fevers. He was seen in the ED and given prescriptions, however he was unable to him due to cost. He has been seen at [facility] and at [facility]. No fevers. No difficulty breathing. He has had some vomiting also. She's reportedly had some lesions on his eyes   Past Medical History  Diagnosis Date  . Mouth ulcers   . MRSA (methicillin resistant staph aureus) culture positive   . Herpes    Past Surgical History  Procedure Laterality Date  . Biopsy pharynx     Family History  Problem Relation Age of Onset  . Heart disease Mother   . Lupus Neg Hx   . Crohn's disease Neg Hx    History  Substance Use Topics  . Smoking status: Current Every Day Smoker -- 0.25 packs/day    Types: Cigarettes  . Smokeless tobacco: Never Used  . Alcohol Use: Yes     Comment: occ    Review of Systems  Constitutional: Negative for activity change and appetite change.  HENT: Positive for mouth sores and trouble swallowing.   Eyes: Negative for pain.  Respiratory: Negative for chest tightness and shortness  of breath.   Cardiovascular: Negative for chest pain and leg swelling.  Gastrointestinal: Positive for vomiting. Negative for nausea, abdominal pain and diarrhea.  Genitourinary: Negative for flank pain.  Musculoskeletal: Negative for back pain and neck stiffness.  Skin: Negative for rash.  Neurological: Negative for weakness, numbness and headaches.  Psychiatric/Behavioral: Negative for behavioral problems.    Allergies  Review of patient's allergies indicates no known allergies.  Home Medications   Current Outpatient Rx  Name  Route  Sig  Dispense  Refill  . Alum & Mag Hydroxide-Simeth (MAGIC MOUTHWASH W/LIDOCAINE) SOLN   Oral   Take 5 mLs by mouth 4 (four) times daily as needed.   50 mL   0    BP 116/81  Pulse 97  Temp(Src) 98.7 F (37.1 C) (Oral)  Resp 18  SpO2 97% Physical Exam  Constitutional: He is oriented to person, place, and time. He appears well-developed and well-nourished.  HENT:  Head: Normocephalic.  Some ulcers and swelling of upper or lower lips. Orally there are white plaques are raised on his tongue. He also has some various ulcers in his mouth and foul smelling breath. Unable to visualize posterior pharynx.  Eyes: Pupils are equal, round, and reactive to light.  Cardiovascular:  Tachycardia  Pulmonary/Chest: Effort normal and breath sounds normal.  Abdominal: There is no tenderness.  Musculoskeletal: Normal range of motion.  Neurological: He is alert and oriented to  person, place, and time.  Skin: Skin is warm.    ED Course  Procedures (including critical care time) Labs Review Labs Reviewed  CBC WITH DIFFERENTIAL - Abnormal; Notable for the following:    Monocytes Relative 16 (*)    All other components within normal limits  BASIC METABOLIC PANEL - Abnormal; Notable for the following:    Glucose, Bld 110 (*)    BUN 29 (*)    GFR calc non Af Amer 79 (*)    All other components within normal limits  SJOGRENS SYNDROME-A EXTRACTABLE NUCLEAR  ANTIBODY  SJOGRENS SYNDROME-B EXTRACTABLE NUCLEAR ANTIBODY  RPR   Imaging Review Ct Soft Tissue Neck W Contrast  09/27/2013   CLINICAL DATA:  Sore throat, worsening.  EXAM: CT NECK WITH CONTRAST  TECHNIQUE: Multidetector CT imaging of the neck was performed using the standard protocol following the bolus administration of intravenous contrast.  CONTRAST:  OMNIPAQUE IOHEXOL 300 MG/ML  SOLN  COMPARISON:  None.  FINDINGS: Epiglottis and aryepiglottic folds are normal. Airways patent. Mild prominence and enhancement of the tonsils suggesting the possibility of tonsillitis. Recommend clinical correlation. No focal fluid collection to suggest tonsillar or peritonsillar abscess. There are mildly prominent bilateral cervical and submandibular lymph nodes, presumably reactive.  Thyroid, submandibular and parotid glands are symmetric and unremarkable. Vascular structures widely patent. Visualized upper lung fields are clear.  No acute bony abnormality.  IMPRESSION: Mildly prominent and enhancing tonsils suggesting tonsillitis. No evidence of tonsillar abscess. Epiglottis and aryepiglottic folds are normal.   Electronically Signed   By: Charlett Nose M.D.   On: 09/27/2013 13:55    EKG Interpretation   None       MDM   1. Stomatitis and mucositis    Patient with recurrent stomatitis. Hasn't been able to eat or drink. Tachycardia is improved somewhat with pain medicines and fluids. CT scan does not show abscess. Will admit to internal medicine. Has had extensive workup of [facility] and [facility].    Harrold Donath R. Rubin Payor, MD 09/27/13 1535

## 2013-09-28 DIAGNOSIS — M352 Behcet's disease: Principal | ICD-10-CM

## 2013-09-28 NOTE — Progress Notes (Signed)
[facility] PROGRESS NOTE  Paul House EAV:[id number] DOB: [birth year] DOA: 09/27/2013 PCP: Default, Provider, MD  Assessment/Plan  Behcet's aphthous ulcers, severe - Continue solumedrol 40mg  IV daily until tolerating PO, then transition to oral steroids to taper over two weeks to 5 or 10mg  per day until able to follow up with rheumatology  - Triamcinolone oral cream  - Carafate ACHS - Dilaudid IV q2h prn pain  - Continue colchicine until able to follow up with rheumatology - Needs referral to ophthalmology and GI, the former to evaluate for uveitis and vasculitis, the latter to rule out Crohn's (ANCA was neg)   No health insurance  - Financial counselor to assist on Monday - Case manager for assistance with medications at discharge   Dehydration suggested by mild elevation of creatinine and BUN:Cr > 20.    Severe protein calorie malnutrition secondary to oral ulcerations, still not able to eat and drink much - Boost tid  - Advance diet as quickly as tolerated  -  Continue IVF  Tobacco use, advised cessation  - Nurse to provide counseling against smoking  Diet: CLD  Access: PIV  IVF: yes  Proph: lovenox   Code Status: full  Family Communication: patient and his wife  Disposition Plan: Admit to [facility]   Consultants:  None  Procedures:  CT soft tissue neck  Antibiotics:  None   HPI/Subjective:  Mouth is about the same today.  Drinking a little.  Was able to get colchicine down with great difficulty.    Objective: Filed Vitals:   09/27/13 1542 09/27/13 2111 09/28/13 0614 09/28/13 1300  BP: 113/80 105/71 114/79 119/74  Pulse: 97 93 76 68  Temp: 98.3 F (36.8 C) 98.9 F (37.2 C) 97.7 F (36.5 C) 97.7 F (36.5 C)  TempSrc: Oral Oral Oral Oral  Resp:  16 16 18   Height: 6' (1.829 m)     Weight: 65.635 kg (144 lb 11.2 oz)     SpO2: 97% 99% 100% 100%    Intake/Output Summary (Last 24 hours) at 09/28/13 1653 Last data filed at 09/28/13  1325  Gross per 24 hour  Intake   1030 ml  Output      0 ml  Net   1030 ml   Filed Weights   09/27/13 1542  Weight: 65.635 kg (144 lb 11.2 oz)    Exam:   General:  Thin AAM, No acute distress  HEENT:  NCAT, Tongue is less white today, part of roof of mouth now denuded.  Still hemorrhagic ulcers on lips and sides of mouth  Cardiovascular:  RRR, nl S1, S2 no mrg, 2+ pulses, warm extremities  Respiratory:  CTAB, no increased WOB  Abdomen:   NABS, soft, NT/ND  MSK:   Normal tone and bulk, no LEE  Neuro:  Grossly intact  Data Reviewed: Basic Metabolic Panel:  Recent Labs Lab 09/27/13 1130  NA 139  K 4.3  CL 99  CO2 24  GLUCOSE 110*  BUN 29*  CREATININE 1.26  CALCIUM 10.2   Liver Function Tests: No results found for this basename: AST, ALT, ALKPHOS, BILITOT, PROT, ALBUMIN,  in the last 168 hours No results found for this basename: LIPASE, AMYLASE,  in the last 168 hours No results found for this basename: AMMONIA,  in the last 168 hours CBC:  Recent Labs Lab 09/27/13 1130  WBC 6.2  NEUTROABS 3.5  HGB 16.8  HCT 46.9  MCV 87.0  PLT 199   Cardiac Enzymes: No results found  for this basename: CKTOTAL, CKMB, CKMBINDEX, TROPONINI,  in the last 168 hours BNP (last 3 results) No results found for this basename: PROBNP,  in the last 8760 hours CBG: No results found for this basename: GLUCAP,  in the last 168 hours  No results found for this or any previous visit (from the past 240 hour(s)).   Studies: Ct Soft Tissue Neck W Contrast  09/27/2013   CLINICAL DATA:  Sore throat, worsening.  EXAM: CT NECK WITH CONTRAST  TECHNIQUE: Multidetector CT imaging of the neck was performed using the standard protocol following the bolus administration of intravenous contrast.  CONTRAST:  OMNIPAQUE IOHEXOL 300 MG/ML  SOLN  COMPARISON:  None.  FINDINGS: Epiglottis and aryepiglottic folds are normal. Airways patent. Mild prominence and enhancement of the tonsils suggesting  the possibility of tonsillitis. Recommend clinical correlation. No focal fluid collection to suggest tonsillar or peritonsillar abscess. There are mildly prominent bilateral cervical and submandibular lymph nodes, presumably reactive.  Thyroid, submandibular and parotid glands are symmetric and unremarkable. Vascular structures widely patent. Visualized upper lung fields are clear.  No acute bony abnormality.  IMPRESSION: Mildly prominent and enhancing tonsils suggesting tonsillitis. No evidence of tonsillar abscess. Epiglottis and aryepiglottic folds are normal.   Electronically Signed   By: Charlett Nose M.D.   On: 09/27/2013 13:55    Scheduled Meds: . colchicine  0.6 mg Oral BID  . enoxaparin (LOVENOX) injection  40 mg Subcutaneous Q24H  . lactose free nutrition  237 mL Oral TID WC  . methylPREDNISolone (SOLU-MEDROL) injection  40 mg Intravenous Daily  . sucralfate  1 g Oral TID WC & HS  . triamcinolone   Mouth/Throat TID   Continuous Infusions: . sodium chloride 125 mL/hr at 09/27/13 2136    Active Problems:   Stomatitis and mucositis   Behcet's disease    Time spent: 30 min    Otillia Cordone, [facility]  [facility] Pager [phone]. If 7PM-7AM, please contact night-coverage at [email or web address], password [facility] 09/28/2013, 4:53 PM  LOS: 1 day

## 2013-09-28 NOTE — Progress Notes (Signed)
09/28/2013 1800 NCM spoke to pt and states he has not recertified for his Medicaid. Pt does qualify for Medicaid but has not had transportation to get to [facility]. States he will need assistance with his medications. Message left with Financial Counselor to follow up on Medicaid. Isidoro Donning RN CCM Case Mgmt phone [phone]

## 2013-09-29 MED ORDER — MAGIC MOUTHWASH W/LIDOCAINE
10.0000 mL | Freq: Three times a day (TID) | ORAL | Status: DC | PRN
  Administered 2013-09-29 – 2013-09-30 (×2): 10 mL via ORAL
  Filled 2013-09-29: qty 10

## 2013-09-29 MED ORDER — MAGIC MOUTHWASH W/LIDOCAINE
10.0000 mL | Freq: Three times a day (TID) | ORAL | Status: DC
  Administered 2013-09-29 (×2): 10 mL via ORAL
  Filled 2013-09-29 (×4): qty 10

## 2013-09-29 NOTE — Progress Notes (Signed)
[facility] PROGRESS NOTE  Paul House GNF:[id number] DOB: October 14, 1990 DOA: 09/27/2013 PCP: Default, Provider, MD  Assessment/Plan  Behcet's aphthous ulcers, severe - Continue solumedrol 40mg  IV daily until tolerating PO, then transition to oral steroids to taper over two weeks to 5 or 10mg  per day until able to follow up with rheumatology  - Triamcinolone oral cream  - Carafate ACHS - Dilaudid IV q2h prn pain  - Continue colchicine until able to follow up with rheumatology - Needs referral to ophthalmology and GI, the former to evaluate for uveitis and vasculitis, the latter to rule out Crohn's (ANCA was neg)   No health insurance  - Financial counselor to assist on Monday - Case manager for assistance with medications at discharge   Dehydration suggested by mild elevation of creatinine and BUN:Cr > 20.    Severe protein calorie malnutrition secondary to oral ulcerations, still not able to eat and drink much - Boost tid  - Advance diet to full liquid - decrease IVF -  appreciate nutrition assistance  Tobacco use, advised cessation  - Nurse to provide counseling against smoking  Diet: FLD  Access: PIV  IVF: yes  Proph: lovenox   Code Status: full  Family Communication: patient  Disposition Plan:  Pending able to stay hydrated  By mouth   Consultants:  None  Procedures:  CT soft tissue neck  Antibiotics:  None   HPI/Subjective:  Mouth is slightly improved today. Able to drink some fluids yesterday.  Still suctioning secretions some.    Objective: Filed Vitals:   09/28/13 1300 09/28/13 2111 09/29/13 0510 09/29/13 1331  BP: 119/74 119/76 127/79 129/76  Pulse: 68 80 69 77  Temp: 97.7 F (36.5 C) 97.8 F (36.6 C) 97.8 F (36.6 C) 97.3 F (36.3 C)  TempSrc: Oral Oral Oral Axillary  Resp: 18 16 18 18   Height:      Weight:      SpO2: 100% 100% 100% 100%    Intake/Output Summary (Last 24 hours) at 09/29/13 1751 Last data filed at  09/29/13 1750  Gross per 24 hour  Intake 5150.83 ml  Output    300 ml  Net 4850.83 ml   Filed Weights   09/27/13 1542  Weight: 65.635 kg (144 lb 11.2 oz)    Exam:   General:  Thin AAM, No acute distress  HEENT:  NCAT, Tongue is still patchy white today, part of roof of mouth now denuded, sides of mouth show healing ulcers.  Still hemorrhagic ulcers on lips and sides of mouth  Cardiovascular:  RRR, nl S1, S2 no mrg, 2+ pulses, warm extremities  Respiratory:  CTAB, no increased WOB  Abdomen:   NABS, soft, NT/ND  MSK:   Normal tone and bulk, no LEE  Neuro:  Grossly intact  Data Reviewed: Basic Metabolic Panel:  Recent Labs Lab 09/27/13 1130  NA 139  K 4.3  CL 99  CO2 24  GLUCOSE 110*  BUN 29*  CREATININE 1.26  CALCIUM 10.2   Liver Function Tests: No results found for this basename: AST, ALT, ALKPHOS, BILITOT, PROT, ALBUMIN,  in the last 168 hours No results found for this basename: LIPASE, AMYLASE,  in the last 168 hours No results found for this basename: AMMONIA,  in the last 168 hours CBC:  Recent Labs Lab 09/27/13 1130  WBC 6.2  NEUTROABS 3.5  HGB 16.8  HCT 46.9  MCV 87.0  PLT 199   Cardiac Enzymes: No results found for this basename: CKTOTAL, CKMB,  CKMBINDEX, TROPONINI,  in the last 168 hours BNP (last 3 results) No results found for this basename: PROBNP,  in the last 8760 hours CBG: No results found for this basename: GLUCAP,  in the last 168 hours  No results found for this or any previous visit (from the past 240 hour(s)).   Studies: No results found.  Scheduled Meds: . colchicine  0.6 mg Oral BID  . enoxaparin (LOVENOX) injection  40 mg Subcutaneous Q24H  . lactose free nutrition  237 mL Oral TID WC  . methylPREDNISolone (SOLU-MEDROL) injection  40 mg Intravenous Daily  . sucralfate  1 g Oral TID WC & HS  . triamcinolone   Mouth/Throat TID   Continuous Infusions: . sodium chloride 125 mL/hr at 09/29/13 1127    Active  Problems:   Stomatitis and mucositis   Behcet's disease    Time spent: 30 min    Annelisa Ryback, [facility]  [facility] Pager [phone]. If 7PM-7AM, please contact night-coverage at [email or web address], password [facility] 09/29/2013, 5:51 PM  LOS: 2 days

## 2013-09-29 NOTE — Progress Notes (Signed)
INITIAL NUTRITION ASSESSMENT  Pt meets criteria for severe MALNUTRITION in the context of chronic illness as evidenced by <50% estimated energy intake with 6.5% weight loss in the past month per pt report.  DOCUMENTATION CODES Per approved criteria  -Severe malnutrition in the context of chronic illness   INTERVENTION: - Continue Boost Plus TID - Encouraged bland soft diet choices however all pt wanted to eat/drink was whole milk, which was provided for pt - Encouraged increased PO/nutritional supplement intake as tolerated - Will continue to monitor   NUTRITION DIAGNOSIS: Inadequate oral intake related to oral ulcers as evidenced by pt report.   Goal: Pt to consume >90% of meals/supplements  Monitor:  Weights, labs, intake  Reason for Assessment: Malnutrition screening tool   22 y.o. male  Admitting Dx: Sore throat   ASSESSMENT: Pt with history of recurrent oral ulcers requiring hospital admission due to pain and inability to swallow who presents with sore throat. Mr. Jablonowski has had episodic ulceration of his mouth since he was 22 years old. He has had repeated hospitalizations and extensive workup at [facility] and at [facility].   Pt known to RD from past admissions. Pt reports he has not been able to eat since 09/12/13, only drinking 1 cup of 2% milk daily. Reports 10 pound unintended weight loss since then. Magic mouthwash does not help him. During visit, pt was pressing on lower lip with tissue paper getting blood off blisters. States he ate all of the grits on his full liquid tray today. Asked for eggs however pt only on full liquid diet.   Height: Ht Readings from Last 1 Encounters:  09/27/13 6' (1.829 m)    Weight: Wt Readings from Last 1 Encounters:  09/27/13 144 lb 11.2 oz (65.635 kg)    Ideal Body Weight: 178 lb   % Ideal Body Weight: 81%  Wt Readings from Last 10 Encounters:  09/27/13 144 lb 11.2 oz (65.635 kg)  09/23/13 150 lb (68.04 kg)   03/12/13 148 lb (67.132 kg)  10/02/12 145 lb 11.2 oz (66.089 kg)  07/08/12 135 lb 2.3 oz (61.3 kg)  07/03/12 150 lb (68.04 kg)  04/17/12 150 lb (68.04 kg)  03/08/12 140 lb (63.504 kg)  03/02/12 155 lb (70.308 kg)    Usual Body Weight: 154 lb per pt  % Usual Body Weight: 93%  BMI:  Body mass index is 19.62 kg/(m^2).  Estimated Nutritional Needs: Kcal: 1950-2150 Protein: 100-120g Fluid: 1.9-2.1L/day  Skin: Lower lip blisters  Diet Order: Full Liquid  EDUCATION NEEDS: -No education needs identified at this time   Intake/Output Summary (Last 24 hours) at 09/29/13 1412 Last data filed at 09/29/13 1331  Gross per 24 hour  Intake 5270.83 ml  Output      0 ml  Net 5270.83 ml    Last BM: 12/20  Labs:   Recent Labs Lab 09/27/13 1130  NA 139  K 4.3  CL 99  CO2 24  BUN 29*  CREATININE 1.26  CALCIUM 10.2  GLUCOSE 110*    CBG (last 3)  No results found for this basename: GLUCAP,  in the last 72 hours  Scheduled Meds: . colchicine  0.6 mg Oral BID  . enoxaparin (LOVENOX) injection  40 mg Subcutaneous Q24H  . lactose free nutrition  237 mL Oral TID WC  . magic mouthwash w/lidocaine  10 mL Oral TID AC & HS  . methylPREDNISolone (SOLU-MEDROL) injection  40 mg Intravenous Daily  . sucralfate  1 g  Oral TID WC & HS  . triamcinolone   Mouth/Throat TID    Continuous Infusions: . sodium chloride 125 mL/hr at 09/29/13 1127    Past Medical History  Diagnosis Date  . Behcet's disease   . MRSA (methicillin resistant staph aureus) culture positive   . Herpes     Past Surgical History  Procedure Laterality Date  . Biopsy pharynx      Levon Hedger MS, RD, LDN [phone] Pager [phone] After Hours Pager

## 2013-09-29 NOTE — Progress Notes (Signed)
Clinical Social Work Department BRIEF PSYCHOSOCIAL ASSESSMENT 09/29/2013  Patient:  Paul House, Paul House     Account Number:  [id number]     Admit date:  09/27/2013  Clinical Social Worker:  Dennison Bulla  Date/Time:  09/29/2013 09:30 AM  Referred by:  Physician  Date Referred:  09/29/2013 Referred for  Other - See comment   Other Referral:   Interview type:  Patient Other interview type:    PSYCHOSOCIAL DATA Living Status:  FAMILY Admitted from facility:   Level of care:   Primary support name:  Hospital doctor Primary support relationship to patient:  SPOUSE Degree of support available:   Adequate    CURRENT CONCERNS Current Concerns  Other - See comment   Other Concerns:   Referral given during progression meeting regarding patient possibly needing assistance with community resources at DC.    SOCIAL WORK ASSESSMENT / PLAN CSW received referral during progression meeting. CSW reviewed chart and met with patient at bedside. CSW introduced myself and explained role.    Patient laying in bed and watching TV when CSW arrived. Patient reports that he came to the hospital due to pain and reports that he is still feeling bad. Patient states that he lives at home with family and plans to return at DC. CSW explained that CM will talk with patient regarding medication assistance but inquired if patient was interested in any further resources.    Patient denies any assistance from CSW at this time and declines any resources. CSW is signing off but available if further needs arise.   Assessment/plan status:  No Further Intervention Required Other assessment/ plan:   Information/referral to community resources:   Patient declined all resources    PATIENT'S/FAMILY'S RESPONSE TO PLAN OF CARE: Patient alert and oriented. Patient disengaged throughout assessment and avoids eye contact with CSW. Patient is not open to discussing needs and reports no assistance needed. Patient aware that  he can request to speak with CSW if further needs arise.       [place], [place] [phone]

## 2013-09-30 NOTE — Progress Notes (Signed)
Encouraged patient to increase his walking in hallways today and sit up some in the chair.

## 2013-09-30 NOTE — Progress Notes (Signed)
[facility] PROGRESS NOTE  Paul House ZOX:[id number] DOB: [birth year] DOA: 09/27/2013 PCP: Default, Provider, MD  Assessment/Plan  Behcet's aphthous ulcers, severe - Continue solumedrol 40mg  IV daily until tolerating PO, then transition to oral steroids to taper over two weeks to 5 or 10mg  per day until able to follow up with rheumatology  - Triamcinolone oral cream  - Carafate ACHS - Dilaudid IV q2h prn pain  - Continue colchicine until able to follow up with rheumatology - Needs referral to ophthalmology and GI, the former to evaluate for uveitis and vasculitis, the latter to rule out Crohn's (ANCA was neg)   No health insurance, previously had medicaid but did not reapply - Appreciate financial counselor assistance - Case manager for assistance with medications at discharge   Dehydration suggested by mild elevation of creatinine and BUN:Cr > 20.    Severe protein calorie malnutrition secondary to oral ulcerations, still not able to eat and drink much - Boost tid  - Advance diet to dysphagia 2 - continue IVF -  appreciate nutrition assistance  Tobacco use, advised cessation  - Nurse to provide counseling against smoking  Diet: dysphagia 2  Access: PIV  IVF: yes  Proph: lovenox   Code Status: full  Family Communication: patient  Disposition Plan:  Pending able to stay hydrated    Consultants:  None  Procedures:  CT soft tissue neck  Antibiotics:  None   HPI/Subjective:  Mouth is still painful.  Still suctioning secretions.  Difficulty swallowing pills.  Asking for scrambled eggs today  Objective: Filed Vitals:   09/29/13 0510 09/29/13 1331 09/29/13 2127 09/30/13 0533  BP: 127/79 129/76 123/75 136/85  Pulse: 69 77 78 62  Temp: 97.8 F (36.6 C) 97.3 F (36.3 C) 98 F (36.7 C)   TempSrc: Oral Axillary Oral Oral  Resp: 18 18 16 18   Height:      Weight:      SpO2: 100% 100% 100% 100%    Intake/Output Summary (Last 24 hours) at  09/30/13 1304 Last data filed at 09/29/13 1750  Gross per 24 hour  Intake    240 ml  Output    300 ml  Net    -60 ml   Filed Weights   09/27/13 1542  Weight: 65.635 kg (144 lb 11.2 oz)    Exam:   General:  Thin AAM, No acute distress  HEENT:  NCAT, Tongue is still patchy white today, part of roof of mouth now denuded.  Hemorrhagic ulcers on lips and sides of mouth.  Able to open mouth farther today  Cardiovascular:  RRR, nl S1, S2 no mrg, 2+ pulses, warm extremities  Respiratory:  CTAB, no increased WOB  Abdomen:   NABS, soft, NT/ND  MSK:   Normal tone and bulk, no LEE  Neuro:  Grossly intact  Data Reviewed: Basic Metabolic Panel:  Recent Labs Lab 09/27/13 1130  NA 139  K 4.3  CL 99  CO2 24  GLUCOSE 110*  BUN 29*  CREATININE 1.26  CALCIUM 10.2   Liver Function Tests: No results found for this basename: AST, ALT, ALKPHOS, BILITOT, PROT, ALBUMIN,  in the last 168 hours No results found for this basename: LIPASE, AMYLASE,  in the last 168 hours No results found for this basename: AMMONIA,  in the last 168 hours CBC:  Recent Labs Lab 09/27/13 1130  WBC 6.2  NEUTROABS 3.5  HGB 16.8  HCT 46.9  MCV 87.0  PLT 199   Cardiac Enzymes: No results  found for this basename: CKTOTAL, CKMB, CKMBINDEX, TROPONINI,  in the last 168 hours BNP (last 3 results) No results found for this basename: PROBNP,  in the last 8760 hours CBG: No results found for this basename: GLUCAP,  in the last 168 hours  No results found for this or any previous visit (from the past 240 hour(s)).   Studies: No results found.  Scheduled Meds: . colchicine  0.6 mg Oral BID  . enoxaparin (LOVENOX) injection  40 mg Subcutaneous Q24H  . lactose free nutrition  237 mL Oral TID WC  . methylPREDNISolone (SOLU-MEDROL) injection  40 mg Intravenous Daily  . sucralfate  1 g Oral TID WC & HS  . triamcinolone   Mouth/Throat TID   Continuous Infusions: . sodium chloride 75 mL/hr at 09/30/13 1610     Active Problems:   Stomatitis and mucositis   Behcet's disease    Time spent: 30 min    Shawnese Magner, [facility]  [facility] Pager [phone]. If 7PM-7AM, please contact night-coverage at [email or web address], password [facility] 09/30/2013, 1:04 PM  LOS: 3 days

## 2013-10-01 DIAGNOSIS — F17200 Nicotine dependence, unspecified, uncomplicated: Secondary | ICD-10-CM

## 2013-10-01 LAB — BASIC METABOLIC PANEL
BUN: 9 mg/dL (ref 6–23)
CO2: 26 mEq/L (ref 19–32)
Calcium: 8.4 mg/dL (ref 8.4–10.5)
Chloride: 105 mEq/L (ref 96–112)
Creatinine, Ser: 0.79 mg/dL (ref 0.50–1.35)
Glucose, Bld: 96 mg/dL (ref 70–99)
Potassium: 3.2 mEq/L — ABNORMAL LOW (ref 3.5–5.1)

## 2013-10-01 MED ORDER — PREDNISONE 50 MG PO TABS
50.0000 mg | ORAL_TABLET | Freq: Every day | ORAL | Status: DC
  Administered 2013-10-01 – 2013-10-02 (×2): 50 mg via ORAL
  Filled 2013-10-01 (×2): qty 1

## 2013-10-01 NOTE — Progress Notes (Addendum)
CARE MANAGEMENT NOTE 10/01/2013  Patient:  Paul House, Paul House   Account Number:  [id number]  Date Initiated:  09/28/2013  Documentation initiated by:  Lanier Clam  Subjective/Objective Assessment:   22 Y/O M ADMITTED W/SORE THROAT.STOMATITIS/MUCOSITIS.     Action/Plan:   FROM HOME.   Anticipated DC Date:  09/30/2013   Anticipated DC Plan:  HOME/SELF CARE  In-house referral  Financial Counselor      DC Planning Services  CM consult  MATCH Program  [facility]      Choice offered to / List presented to:             Status of service:  In process, will continue to follow Medicare Important Message given?   (If response is "NO", the following Medicare IM given date fields will be blank) Date Medicare IM given:   Date Additional Medicare IM given:    Discharge Disposition:    Per UR Regulation:  Reviewed for med. necessity/level of care/duration of stay  If discussed at Long Length of Stay Meetings, dates discussed:    Comments:  [id number]/Paul Garrido,RN,BSN,CCM:  [phone]; request for post hospital medical follow up appt requested from [facility] on [address], demographics sent with request for scheduler to call patient with appointment time and date.  Also requested that a rhuematiodologist contact and followup is made avaiable to patient once in the clinic system.  Will follow to see if patient needs meds for dc.  Match program with participating pharmacies and patient letter given to the patient with explanation for usage.  Patient verbal returned understanding.  09/28/2013 1800 NCM spoke to pt and states he has not recertified for his Medicaid. Pt does qualify for Medicaid but has not had transportation to get to DSS. States he will need assistance with his medications. Message left with Financial Counselor to follow up on Medicaid. Isidoro Donning RN CCM Case Mgmt phone [phone]

## 2013-10-01 NOTE — Plan of Care (Signed)
Problem: Phase I Progression Outcomes Goal: OOB as tolerated unless otherwise ordered Outcome: Progressing Ambulating to the bathroom     

## 2013-10-01 NOTE — Progress Notes (Signed)
Pt anticipating discharge in the AM. IV kept in for one more day. Dressing changed, area cleansed with  chorahexidine and new dressing, transparent occlusive, reapplied. Tubing also changed. Pt eating 100 % of his meals with no problems.

## 2013-10-01 NOTE — Progress Notes (Signed)
[facility] PROGRESS NOTE  Paul House ZOX:[id number] DOB: [birth year] DOA: 09/27/2013 PCP: Default, Provider, MD  Assessment/Plan  Behcet's aphthous ulcers, severe Change Solu-Medrol to by mouth then transition to oral steroids to taper over two weeks to 5 or 10mg  per day until able to follow up with rheumatology  - Triamcinolone oral cream  - Carafate ACHS - Dilaudid IV q2h prn pain  - Continue colchicine until able to follow up with rheumatology - Needs referral to ophthalmology and GI, the former to evaluate for uveitis and vasculitis, the latter to rule out Crohn's (ANCA was neg) -we'll make upon discharge  No health insurance, previously had medicaid but did not reapply - Appreciate financial counselor assistance - Case manager for assistance with medications at discharge   Dehydration suggested by mild elevation of creatinine and BUN:Cr > 20.  , Resolved  Severe protein calorie malnutrition secondary to oral ulcerations, still not able to eat and drink much - Boost tid  - Advance diet from dysphagia 2 - continue IVF -  appreciate nutrition assistance  Tobacco use, advised cessation  - Nurse to provide counseling against smoking  Diet: Upgraded to regular Access: PIV  IVF: yes  Proph: lovenox   Code Status: full  Family Communication: Discussed with patient Disposition Plan:  Home likely tomorrow   Consultants:  None  Procedures:  CT soft tissue neck  Antibiotics:  None   HPI/Subjective: Mouth less painful. Able to tolerate dysphagia 2 diet  Objective: Filed Vitals:   09/30/13 1310 09/30/13 2141 10/01/13 0603 10/01/13 1305  BP: 124/68 122/72 112/72 121/72  Pulse: 70 72 69 79  Temp: 98.5 F (36.9 C) 97.3 F (36.3 C) 96.6 F (35.9 C) 97.9 F (36.6 C)  TempSrc: Oral Oral Axillary Oral  Resp: 18 17 17 18   Height:      Weight:      SpO2: 100% 99% 99% 99%    Intake/Output Summary (Last 24 hours) at 10/01/13 1551 Last data filed  at 10/01/13 8119  Gross per 24 hour  Intake 5281.25 ml  Output    300 ml  Net 4981.25 ml   Filed Weights   09/27/13 1542  Weight: 65.635 kg (144 lb 11.2 oz)    Exam:   General:  Thin AAM, No acute distress  HEENT:  NCAT, Tongue is still patchy white today, part of roof of mouth now denuded.  Hemorrhagic ulcers on lips and sides of mouth, according to patient improved  Cardiovascular:  RRR, nl S1, S2 no mrg, 2+ pulses, warm extremities  Respiratory:  CTAB, no increased WOB  Abdomen:   Soft nontender, nondistended, positive bowel sounds  MSK:   No clubbing or cyanosis or edema  Neuro:  Grossly intact  Data Reviewed: Basic Metabolic Panel:  Recent Labs Lab 09/27/13 1130 10/01/13 0525  NA 139 138  K 4.3 3.2*  CL 99 105  CO2 24 26  GLUCOSE 110* 96  BUN 29* 9  CREATININE 1.26 0.79  CALCIUM 10.2 8.4   Liver Function Tests: No results found for this basename: AST, ALT, ALKPHOS, BILITOT, PROT, ALBUMIN,  in the last 168 hours No results found for this basename: LIPASE, AMYLASE,  in the last 168 hours No results found for this basename: AMMONIA,  in the last 168 hours CBC:  Recent Labs Lab 09/27/13 1130  WBC 6.2  NEUTROABS 3.5  HGB 16.8  HCT 46.9  MCV 87.0  PLT 199     Studies: No results found.  Scheduled Meds: .  colchicine  0.6 mg Oral BID  . enoxaparin (LOVENOX) injection  40 mg Subcutaneous Q24H  . lactose free nutrition  237 mL Oral TID WC  . predniSONE  50 mg Oral Daily  . sucralfate  1 g Oral TID WC & HS  . triamcinolone   Mouth/Throat TID   Continuous Infusions: . sodium chloride 75 mL/hr at 10/01/13 1208    Active Problems:   Stomatitis and mucositis   Behcet's disease    Time spent: 20 min    Hollice Espy  [facility] Pager [phone]. If 7PM-7AM, please contact night-coverage at [email or web address], password [facility] 10/01/2013, 3:51 PM  LOS: 4 days

## 2013-10-01 NOTE — Plan of Care (Signed)
Problem: Phase I Progression Outcomes Goal: Initial discharge plan identified Outcome: Progressing Plan discharge in AM

## 2013-10-02 MED ORDER — PREDNISONE 10 MG PO TABS: ORAL_TABLET | ORAL | Status: DC

## 2013-10-02 MED ORDER — COLCHICINE 0.6 MG PO TABS: ORAL_TABLET | ORAL | Status: DC

## 2013-10-02 NOTE — Discharge Summary (Signed)
Physician Discharge Summary  Paul House ZOX:[id number] DOB: [birth year] DOA: 09/27/2013  PCP: Default, Provider, MD  Admit date: 09/27/2013 Discharge date: 10/02/2013  Time spent: 25 minutes  Recommendations for Outpatient Follow-up:  Patient is to establish with a primary care physician. He has no insurance. Will start by getting case management to refer him to the [facility] community and [employer]. As today being a holiday, we'll have case management followup with him tomorrow.  Discharge Diagnoses:  Active Problems:   Stomatitis and mucositis   Behcet's disease  protein calorie malnutrition-severe Tobacco abuse Hypokalemia   Discharge Condition: Improved, being discharged home  Diet recommendation: Regular  Filed Weights   09/27/13 1542  Weight: 65.635 kg (144 lb 11.2 oz)    History of present illness:  On 12/20:The patient is a 22 y.o. year-old male with history of recurrent oral ulcers requiring hospital admission due to pain and inability to swallow who presents with sore throat. Paul House has had episodic ulceration of his mouth since he was 22 years old. He has had repeated hospitalizations and extensive workup at [facility] and at [facility]. Initially, he was thought to have recurrent herpes ulcers and was treated with antiviral medications. Antiviral medications do not affect the course of his disease and viral tests including viral PCR and biopsy were negative for viral infection. He is also tested negative for GC and Chlamydia, HIV, fungal infection. Repeat ANA has been negative and ANCA panel was also negative. He has been followed by infectious disease at [facility] who does not believe that this is an infectious process in they are more concerned about autoimmune disease. 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   The patient was last at their baseline health about two weeks ago. He developed mouth sores which have  gotten progressively worse. He initially was able to eat and drink bed over the last several days he has had difficulty swallowing even his secretions. He indicates that his uvula has become very swollen making it difficult to swallow. He denies difficulty breathing. He has had some associated blurry vision and some scratchiness of his left eye and he endorses some dysuria with similar ulcerations on his genitalia. He has tried Magic mouth wash which did not help.  In the emergency department, he was initially afebrile 98.7, tachycardic to 140s, blood pressure stable 116/81, breathing comfortably in room air. His labs are notable for an elevated BUN 29 creatinine 1.26. A CT scan of the soft tissue neck demonstrated mildly prominent enhancing tonsils suggesting tonsillitis without evidence of tonsillar abscess. He was given IV fluids and his heart rate down but he was still unable to even drink. He was then admitted to the hospitalist service   Hospital Course:  Active Problems:   Stomatitis and mucositis/Behcet's disease: Patient was started on IV Solu-Medrol, triamcinolone cream, Carafate and colchicine. Over the next several days his symptoms improved and he was able to advance his diet and by 12/25, was tolerating solid food. He steroids were changed over to by mouth which will continue steroid taper but then continue on 5 mg of prednisone daily. Patient's only outpatient followup has been with infectious disease at [facility], so will refer him to the [facility] and from there discuss with rheumatology about further care for this as well as discussion with ophthalmology for uveitis rule out in the future and gastroenterology for vasculitis   protein calorie malnutrition-severe: This is secondary to oral ulcerations. Nutrition saw patient and recommended  boost 3 times a day. By time of discharge, patient was able to tolerate full by mouth  Tobacco abuse: Provided counseling for  patient  Hypokalemia: With IV fluids, patient potassium was 3.2 on 12/24 and replaced   Procedures:  None  Consultations:  None  Discharge Exam: Filed Vitals:   10/02/13 1338  BP: 124/76  Pulse: 76  Temp: 97.5 F (36.4 C)  Resp: 14    General: Alert and oriented x3, no acute distress, tolerating solid food HEENT: Dried healing ulcers on the oral mucosa and lips Cardiovascular: Regular rate and rhythm, S1-S2 Respiratory: Clear to auscultation bilaterally  Discharge Instructions  Discharge Orders   Future Orders Complete By Expires   Diet general  As directed    Increase activity slowly  As directed        Medication List         magic mouthwash w/lidocaine Soln  Take 5 mLs by mouth 4 (four) times daily as needed.     predniSONE 10 MG tablet  Commonly known as:  DELTASONE  50 mg on day 1, then 40mg  on day 2, then 30 mg on day 3, then 20mg  on day 4, then 10mg  on day 5, then 5mg  (1/2 tab) on day 6 continuous       No Known Allergies     Follow-up Information   Follow up with Case Manager will call you either tomorrow or Monday for referral to [facility].       The results of significant diagnostics from this hospitalization (including imaging, microbiology, ancillary and laboratory) are listed below for reference.    Significant Diagnostic Studies: Ct Soft Tissue Neck W Contrast  09/27/2013     IMPRESSION: Mildly prominent and enhancing tonsils suggesting tonsillitis. No evidence of tonsillar abscess. Epiglottis and aryepiglottic folds are normal.   Electronically Signed   By: Charlett Nose M.D.   On: 09/27/2013 13:55    Microbiology: No results found for this or any previous visit (from the past 240 hour(s)).   Labs: Basic Metabolic Panel:  Recent Labs Lab 09/27/13 1130 10/01/13 0525  NA 139 138  K 4.3 3.2*  CL 99 105  CO2 24 26  GLUCOSE 110* 96  BUN 29* 9  CREATININE 1.26 0.79  CALCIUM 10.2 8.4   Liver  Function Tests: No results found for this basename: AST, ALT, ALKPHOS, BILITOT, PROT, ALBUMIN,  in the last 168 hours No results found for this basename: LIPASE, AMYLASE,  in the last 168 hours No results found for this basename: AMMONIA,  in the last 168 hours CBC:  Recent Labs Lab 09/27/13 1130  WBC 6.2  NEUTROABS 3.5  HGB 16.8  HCT 46.9  MCV 87.0  PLT 199     Signed:  Brihanna Devenport K  [facility] 10/02/2013, 5:54 PM    This

## 2013-10-02 NOTE — Progress Notes (Signed)
Pt to d/c home. AVS reviewed and "My Chart" discussed with pt. Pt capable of verbalizing medications, dressing changes, signs and symptoms of infection, and follow-up appointments. Remains hemodynamically stable. No signs and symptoms of distress. Educated pt to return to ER in the case of SOB, dizziness, or chest pain.  

## 2013-11-11 ENCOUNTER — Inpatient Hospital Stay: Payer: Self-pay

## 2013-12-14 ENCOUNTER — Emergency Department (HOSPITAL_COMMUNITY)
Admission: EM | Admit: 2013-12-14 | Discharge: 2013-12-14 | Disposition: A | Discharge status: Home / Self Care | Payer: PRIVATE HEALTH INSURANCE | Attending: Emergency Medicine | Admitting: Emergency Medicine

## 2013-12-14 ENCOUNTER — Encounter (HOSPITAL_COMMUNITY): Payer: Self-pay | Admitting: Emergency Medicine

## 2013-12-14 DIAGNOSIS — M352 Behcet's disease: Secondary | ICD-10-CM | POA: Insufficient documentation

## 2013-12-14 DIAGNOSIS — IMO0002 Reserved for concepts with insufficient information to code with codable children: Secondary | ICD-10-CM | POA: Insufficient documentation

## 2013-12-14 DIAGNOSIS — R0682 Tachypnea, not elsewhere classified: Secondary | ICD-10-CM | POA: Insufficient documentation

## 2013-12-14 DIAGNOSIS — F17200 Nicotine dependence, unspecified, uncomplicated: Secondary | ICD-10-CM | POA: Insufficient documentation

## 2013-12-14 DIAGNOSIS — N489 Disorder of penis, unspecified: Secondary | ICD-10-CM | POA: Insufficient documentation

## 2013-12-14 DIAGNOSIS — Z8614 Personal history of Methicillin resistant Staphylococcus aureus infection: Secondary | ICD-10-CM | POA: Insufficient documentation

## 2013-12-14 DIAGNOSIS — R000 Tachycardia, unspecified: Secondary | ICD-10-CM | POA: Insufficient documentation

## 2013-12-14 LAB — COMPREHENSIVE METABOLIC PANEL
ALBUMIN: 4.2 g/dL (ref 3.5–5.2)
ALT: 14 U/L (ref 0–53)
AST: 21 U/L (ref 0–37)
Alkaline Phosphatase: 92 U/L (ref 39–117)
BUN: 13 mg/dL (ref 6–23)
CALCIUM: 9.6 mg/dL (ref 8.4–10.5)
CO2: 18 mEq/L — ABNORMAL LOW (ref 19–32)
Chloride: 99 mEq/L (ref 96–112)
Creatinine, Ser: 0.88 mg/dL (ref 0.50–1.35)
GFR calc non Af Amer: >90 mL/min (ref >90)
Glucose, Bld: 84 mg/dL (ref 70–99)
Potassium: 4 mEq/L (ref 3.7–5.3)
Sodium: 139 mEq/L (ref 137–147)
TOTAL PROTEIN: 8.6 g/dL — AB (ref 6.0–8.3)
Total Bilirubin: 1 mg/dL (ref 0.3–1.2)

## 2013-12-14 LAB — I-STAT CG4 LACTIC ACID, ED: LACTIC ACID, VENOUS: 1.7 mmol/L (ref 0.5–2.2)

## 2013-12-14 LAB — CBC WITH DIFFERENTIAL/PLATELET
BASOS PCT: 0 % (ref 0–1)
Basophils Absolute: 0 10*3/uL (ref 0.0–0.1)
EOS ABS: 0.1 10*3/uL (ref 0.0–0.7)
EOS PCT: 2 % (ref 0–5)
HCT: 44.1 % (ref 39.0–52.0)
Hemoglobin: 15.5 g/dL (ref 13.0–17.0)
LYMPHS ABS: 1.4 10*3/uL (ref 0.7–4.0)
Lymphocytes Relative: 24 % (ref 12–46)
MCH: 31.4 pg (ref 26.0–34.0)
MCHC: 35.1 g/dL (ref 30.0–36.0)
MCV: 89.5 fL (ref 78.0–100.0)
Monocytes Absolute: 1 10*3/uL (ref 0.1–1.0)
Monocytes Relative: 17 % — ABNORMAL HIGH (ref 3–12)
Neutro Abs: 3.2 10*3/uL (ref 1.7–7.7)
Neutrophils Relative %: 56 % (ref 43–77)
PLATELETS: 184 10*3/uL (ref 150–400)
RBC: 4.93 MIL/uL (ref 4.22–5.81)
RDW: 11.9 % (ref 11.5–15.5)
WBC: 5.6 10*3/uL (ref 4.0–10.5)

## 2013-12-14 MED ORDER — HYDROMORPHONE HCL PF 1 MG/ML IJ SOLN
1.0000 mg | Freq: Once | INTRAMUSCULAR | Status: AC
Start: 2013-12-14 — End: 2013-12-14
  Administered 2013-12-14: 1 mg via INTRAVENOUS
  Filled 2013-12-14: qty 1

## 2013-12-14 MED ORDER — SODIUM CHLORIDE 0.9 % IV BOLUS (SEPSIS)
1000.0000 mL | Freq: Once | INTRAVENOUS | Status: AC
  Administered 2013-12-14: 1000 mL via INTRAVENOUS

## 2013-12-14 MED ORDER — PREDNISONE 20 MG PO TABS: ORAL_TABLET | ORAL | Status: DC

## 2013-12-14 MED ORDER — METHYLPREDNISOLONE SODIUM SUCC 125 MG IJ SOLR
125.0000 mg | Freq: Once | INTRAMUSCULAR | Status: AC
  Administered 2013-12-14: 125 mg via INTRAVENOUS
  Filled 2013-12-14: qty 2

## 2013-12-14 MED ORDER — MAGIC MOUTHWASH
5.0000 mL | Freq: Once | ORAL | Status: AC
  Administered 2013-12-14: 5 mL via ORAL
  Filled 2013-12-14: qty 5

## 2013-12-14 MED ORDER — ONDANSETRON HCL 4 MG/2ML IJ SOLN
4.0000 mg | Freq: Once | INTRAMUSCULAR | Status: AC
  Administered 2013-12-14: 4 mg via INTRAVENOUS
  Filled 2013-12-14: qty 2

## 2013-12-14 MED ORDER — HYDROCODONE-ACETAMINOPHEN 5-325 MG PO TABS: 2.0000 | ORAL_TABLET | ORAL | Status: DC | PRN

## 2013-12-14 NOTE — ED Notes (Signed)
Pt states he began having mouth ulcers 2 weeks ago and also has ulcers on his penis.  Pt is unable to open his mouth because of the ulcers.  Pt is unable to eat or drink because of the pain.  Pt denies N/V and fever.  Triage exam limited because pt reluctant to open his mouth.  Discharge noted around pt's lips and tongue.

## 2013-12-14 NOTE — ED Provider Notes (Signed)
CSN: [id number]     Arrival date & time 12/14/13  1355 History   First MD Initiated Contact with Patient 12/14/13 1421     Chief Complaint  Patient presents with  . Mouth Lesions  . Penis Pain     (Consider location/radiation/quality/duration/timing/severity/associated sxs/prior Treatment) Patient is a 23 y.o. male presenting with mouth sores and penile pain. The history is provided by the patient.  Mouth Lesions Location:  Buccal mucosa, upper lip, lower lip, lower gingiva and upper gingiva Quality:  Blistered, crusty, painful, ulcerous and weeping (purulent drainage) Pain details:    Quality:  Sharp, shooting and throbbing   Severity:  Severe   Duration:  2 weeks   Timing:  Constant   Progression:  Worsening Onset quality:  Gradual Severity:  Severe Duration:  2 weeks Progression:  Worsening Chronicity:  Recurrent Context comment:  Hx of behcet's  Relieved by:  None tried Worsened by:  Drinking and eating Ineffective treatments:  None tried Associated symptoms: no fever and no rash   Associated symptoms comment:  Ulcerative lesions on the penis started the same time as his mouth lesions. Penis Pain    Past Medical History  Diagnosis Date  . Behcet's disease   . MRSA (methicillin resistant staph aureus) culture positive   . Herpes    Past Surgical History  Procedure Laterality Date  . Biopsy pharynx     Family History  Problem Relation Age of Onset  . Heart disease Mother   . Lupus Neg Hx   . Crohn's disease Neg Hx    History  Substance Use Topics  . Smoking status: Current Every Day Smoker -- 0.25 packs/day    Types: Cigarettes  . Smokeless tobacco: Never Used  . Alcohol Use: Yes     Comment: occ    Review of Systems  Constitutional: Negative for fever.  HENT: Positive for mouth sores.   Genitourinary: Positive for penile pain.  Skin: Negative for rash.  All other systems reviewed and are negative.      Allergies  Review of patient's allergies  indicates no known allergies.  Home Medications   Current Outpatient Rx  Name  Route  Sig  Dispense  Refill  . Alum & Mag Hydroxide-Simeth (MAGIC MOUTHWASH W/LIDOCAINE) SOLN   Oral   Take 5 mLs by mouth 4 (four) times daily as needed.   50 mL   0   . predniSONE (DELTASONE) 10 MG tablet      50 mg on day 1, then 40mg  on day 2, then 30 mg on day 3, then 20mg  on day 4, then 10mg  on day 5, then 5mg  (1/2 tab) on day 6 continuous   30 tablet   0    BP 144/87  Pulse 103  Temp(Src) 98.4 F (36.9 C) (Oral)  Resp 17  SpO2 97% Physical Exam  Constitutional: He is oriented to person, place, and time. He appears well-developed and well-nourished. No distress.  HENT:  Head: Normocephalic and atraumatic.  Right Ear: External ear normal.  Left Ear: External ear normal.  Mouth/Throat: Oropharynx is clear and moist. Oral lesions present.  Ulcerative lesions over the lips and gums with purulent discharge.  Painful to touch.  White coating on the tongue and halitosis   Eyes: Conjunctivae and EOM are normal. Pupils are equal, round, and reactive to light. Right eye exhibits no discharge.  Neck: Normal range of motion. Neck supple.  Cardiovascular: Regular rhythm, normal heart sounds and intact distal pulses.  Tachycardia  present.   No murmur heard. Pulmonary/Chest: Tachypnea noted. No respiratory distress. He has no decreased breath sounds. He has no wheezes. He has no rhonchi. He has no rales.  Abdominal: Soft. There is no tenderness.  Genitourinary:    Penile tenderness present.  Musculoskeletal: Normal range of motion. He exhibits no edema and no tenderness.  Neurological: He is alert and oriented to person, place, and time.  Skin: Skin is warm and dry. No rash noted.  Psychiatric: He has a normal mood and affect.    ED Course  Procedures (including critical care time) Labs Review Labs Reviewed  CBC WITH DIFFERENTIAL  COMPREHENSIVE METABOLIC PANEL  I-STAT CG4 LACTIC ACID, ED    Imaging Review No results found.   EKG Interpretation None      MDM   Final diagnoses:  None    Patient with a history of Behcet's disease who has had a flare for the last 2 weeks and is now unable to eat. Patient has diffuse lesions over the mouth with purulent discharge and halitosis. He can't open his mouth with no trismus but refuses to eat or drink due to the pain. He also has lesions over his penis without signs of secondary infection. Will hydrate patient and give pain medication. Also patient given a dose of IV steroids. Discussed with he and his significant other the importance of steroids possibly in the future immunosuppressants and followup. However she states her unable to afford any medication at this time.  Low suspicion for STD or infectious etiology of this cause.  Pt checked out to Dr. Felix Pacini at 1500.  Gwyneth Sprout, MD 12/17/13 [phone]

## 2013-12-14 NOTE — Discharge Instructions (Signed)
Behçet's Disease °Behçet's disease is a rare, chronic, lifelong disorder. It involves inflammation of blood vessels throughout the body.  °CAUSES  °The exact cause is unknown. It is believed that an autoimmune reaction may cause blood vessels to become inflamed. It is not clear what triggers this reaction. °SYMPTOMS  °Symptoms of Behçet's disease include: °· Returning (recurrent) genital ulcers and oral ulcers that resemble canker sores.   °· Eye inflammation.   °The disorder may also cause:  °· Various types of skin sores (lesions).   °· Arthritis.   °· Bowel inflammation.   °· Inflammation of the membranes of the brain and spinal cord (meningitis).   °This disease generally begins when patients are in their 20s or 30s. But all age groups may be affected. Behçet's is a multisystem disease. It may involve all organs and affect the central nervous system. This may cause:  °· Memory loss.   °· Impaired speech, balance, and movement.   °TREATMENT  °There is no cure for Behçet's disease. Treatment typically focuses on reducing discomfort and preventing serious complications. The effects of the disease may include blindness, stroke, swelling of the spinal cord, or intestinal complications. Corticosteroids and other medications that suppress the immune system may be given to treat inflammation.  °Behçet's recurs or keeps causing problems (chronic). But patients may have periods of time when symptoms go away temporarily (remission). How bad the disease gets is different from patient to patient. Some patients may live normal lives. Others may become blind or severely disabled. °FOR MORE INFORMATION °American Behçet's Disease Association: www.behcets.com °Document Released: 09/15/2002 Document Revised: 07/16/2013 Document Reviewed: 07/26/2005 °ExitCare® Patient Information ©2014 [employer]. ° °

## 2013-12-14 NOTE — ED Provider Notes (Signed)
4:50 PM he is able to talk without difficulty  Doug SouSam Teren Zurcher, MD 12/14/13 1658

## 2013-12-14 NOTE — ED Provider Notes (Signed)
History is obtained from patient's girlfriend patient and cannot speak due to pain in mouth. He complains of painful lesions at his mouth and at the penis for 2 weeks, worse in the past 3 days. He has not eaten anything in 3 days. Due to pain. Had similar flareups since age 23 due toBechet's dz on exam he is nontoxic appearing HEENT exam there rusted lesions over both lips mucus memories appeared dry. White plaques on tongue. Neck supple lungs clear auscultation genitalia crusted scaly lesions on glans penis. He is able to drink after treatment with Magic mouthwash, intravenous Dilaudid, intravenous fluids and intravenous Solu-Medrol. He states pain is presently file a scale of 1-10. 50 p.m. patient has had several 12 ounce cups of ice water in the emergency department. Plan prescriptions prednisone, Magic mouthwash, Norco. Case management has spoken with patient and given him cards get discount on medication. His wife states that he has contact [facility]. She will call tomorrow. Results for orders placed during the hospital encounter of 12/14/13  CBC WITH DIFFERENTIAL      Result Value Ref Range   WBC 5.6  4.0 - 10.5 K/uL   RBC 4.93  4.22 - 5.81 MIL/uL   Hemoglobin 15.5  13.0 - 17.0 g/dL   HCT 16.144.1  09.639.0 - 04.552.0 %   MCV 89.5  78.0 - 100.0 fL   MCH 31.4  26.0 - 34.0 pg   MCHC 35.1  30.0 - 36.0 g/dL   RDW 40.911.9  81.111.5 - 91.415.5 %   Platelets 184  150 - 400 K/uL   Neutrophils Relative % 56  43 - 77 %   Neutro Abs 3.2  1.7 - 7.7 K/uL   Lymphocytes Relative 24  12 - 46 %   Lymphs Abs 1.4  0.7 - 4.0 K/uL   Monocytes Relative 17 (*) 3 - 12 %   Monocytes Absolute 1.0  0.1 - 1.0 K/uL   Eosinophils Relative 2  0 - 5 %   Eosinophils Absolute 0.1  0.0 - 0.7 K/uL   Basophils Relative 0  0 - 1 %   Basophils Absolute 0.0  0.0 - 0.1 K/uL  COMPREHENSIVE METABOLIC PANEL      Result Value Ref Range   Sodium 139  137 - 147 mEq/L   Potassium 4.0  3.7 - 5.3 mEq/L   Chloride 99  96 - 112 mEq/L   CO2  18 (*) 19 - 32 mEq/L   Glucose, Bld 84  70 - 99 mg/dL   BUN 13  6 - 23 mg/dL   Creatinine, Ser 7.820.88  0.50 - 1.35 mg/dL   Calcium 9.6  8.4 - 95.610.5 mg/dL   Total Protein 8.6 (*) 6.0 - 8.3 g/dL   Albumin 4.2  3.5 - 5.2 g/dL   AST 21  0 - 37 U/L   ALT 14  0 - 53 U/L   Alkaline Phosphatase 92  39 - 117 U/L   Total Bilirubin 1.0  0.3 - 1.2 mg/dL   GFR calc non Af Amer >90  >90 mL/min   GFR calc Af Amer >90  >90 mL/min  I-STAT CG4 LACTIC ACID, ED      Result Value Ref Range   Lactic Acid, Venous 1.70  0.5 - 2.2 mmol/L   No results found.   Doug SouSam Sidney Silberman, MD 12/14/13 1651

## 2013-12-14 NOTE — Progress Notes (Signed)
   CARE MANAGEMENT NOTE 12/14/2013  Patient:  Paul House,Paul House   Account Number:  [id number]  Date Initiated:  12/14/2013  Documentation initiated by:  [facility] Sk[facility]  Subjective/Objective Assessment:   Bechet's sydrome     Action/Plan:   lives at home with wife   Anticipated DC Date:  12/14/2013   Anticipated DC Plan:  HOME W HOME HEALTH SERVICES      DC Planning Services  CM consult  Medication Assistance  [facility]      Choice offered to / List presented to:             Status of service:  Completed, signed off Medicare Important Message given?   (If response is "NO", the following Medicare IM given date fields will be blank) Date Medicare IM given:   Date Additional Medicare IM given:    Discharge Disposition:  HOME/SELF CARE  Per UR Regulation:    If discussed at Long Length of Stay Meetings, dates discussed:    Comments:  12/14/2013 1700 NCM spoke to pt and gave permission to speak to wife. Wife states she is the only one currently working. They have applied for Medicaid and he was denied. States she will follow up next week. NCM explained [facility] with [facility]. Provided pt with MATCH letter and explained program is available once per year. And his medications will have $3.00 copay and no narcotics with this letter is allowed. Isidoro DonningAlesia Terecia Plaut RN CCM Case Mgmt phone [phone]

## 2014-02-28 ENCOUNTER — Encounter (HOSPITAL_COMMUNITY): Payer: Self-pay | Admitting: Emergency Medicine

## 2014-02-28 ENCOUNTER — Emergency Department (HOSPITAL_COMMUNITY)
Admission: EM | Admit: 2014-02-28 | Discharge: 2014-02-28 | Disposition: A | Discharge status: Home / Self Care | Payer: PRIVATE HEALTH INSURANCE | Attending: Emergency Medicine | Admitting: Emergency Medicine

## 2014-02-28 DIAGNOSIS — F17200 Nicotine dependence, unspecified, uncomplicated: Secondary | ICD-10-CM | POA: Insufficient documentation

## 2014-02-28 DIAGNOSIS — IMO0002 Reserved for concepts with insufficient information to code with codable children: Secondary | ICD-10-CM | POA: Insufficient documentation

## 2014-02-28 DIAGNOSIS — K120 Recurrent oral aphthae: Secondary | ICD-10-CM | POA: Insufficient documentation

## 2014-02-28 DIAGNOSIS — M352 Behcet's disease: Secondary | ICD-10-CM | POA: Insufficient documentation

## 2014-02-28 LAB — COMPREHENSIVE METABOLIC PANEL
ALK PHOS: 67 U/L (ref 39–117)
ALT: 19 U/L (ref 0–53)
AST: 20 U/L (ref 0–37)
Albumin: 3.7 g/dL (ref 3.5–5.2)
BILIRUBIN TOTAL: 0.6 mg/dL (ref 0.3–1.2)
BUN: 9 mg/dL (ref 6–23)
CHLORIDE: 103 meq/L (ref 96–112)
CO2: 27 meq/L (ref 19–32)
Calcium: 9.3 mg/dL (ref 8.4–10.5)
Creatinine, Ser: 1.07 mg/dL (ref 0.50–1.35)
GFR calc non Af Amer: >90 mL/min (ref >90)
Glucose, Bld: 91 mg/dL (ref 70–99)
Potassium: 4.3 mEq/L (ref 3.7–5.3)
SODIUM: 140 meq/L (ref 137–147)
TOTAL PROTEIN: 7.3 g/dL (ref 6.0–8.3)

## 2014-02-28 LAB — CBC
HCT: 41.6 % (ref 39.0–52.0)
HEMOGLOBIN: 14.1 g/dL (ref 13.0–17.0)
MCH: 30.6 pg (ref 26.0–34.0)
MCHC: 33.9 g/dL (ref 30.0–36.0)
MCV: 90.2 fL (ref 78.0–100.0)
Platelets: 196 10*3/uL (ref 150–400)
RBC: 4.61 MIL/uL (ref 4.22–5.81)
RDW: 12.3 % (ref 11.5–15.5)
WBC: 4.6 10*3/uL (ref 4.0–10.5)

## 2014-02-28 MED ORDER — PREDNISONE 20 MG PO TABS: ORAL_TABLET | ORAL | Status: DC

## 2014-02-28 MED ORDER — METHYLPREDNISOLONE SODIUM SUCC 125 MG IJ SOLR
125.0000 mg | Freq: Once | INTRAMUSCULAR | Status: AC
  Administered 2014-02-28: 125 mg via INTRAMUSCULAR
  Filled 2014-02-28: qty 2

## 2014-02-28 MED ORDER — HYDROCODONE-ACETAMINOPHEN 5-325 MG PO TABS: 1.0000 | ORAL_TABLET | ORAL | Status: DC | PRN

## 2014-02-28 NOTE — Progress Notes (Signed)
ED CM received call from [name] PA-C concerning patient establishing f/u care. Pt presented to [facility] ED with mouth sores. Pt has had 3 visits to ED in the past months. Pt is uninsured without a PCP and a resident of [place].Spoke with patient by phone confirmed information. He reports that he does not have health  Insurance or a  PCP. Discussed the  [facility] as an option for establsihing care, patient is agreeable and is willing to come to [place]. Proivided patient with [facility]  Information to call and schedule a f/u appt.  ED CM will contact [facility] with patient information to schedule for ED f/u patient agreeable.  Pt verbalized understanding of instructions teach back done. ED CM will f/u with patient regarding ED f/u  appt.  at [facility].

## 2014-02-28 NOTE — ED Notes (Signed)
Deliah Boston, our PA has spoken with case manager; and, as I write this, the case manager is speaking to pt. Via telephone to arrange follow-up details.

## 2014-02-28 NOTE — ED Provider Notes (Signed)
CSN: [id number]     Arrival date & time 02/28/14  1605 History  This chart was scribed for non-physician practitioner, Teressa Lower, FNP,working with Junius Argyle, MD, by Karle Plumber, ED Scribe.  This patient was seen in room WTR1/[facility]PT1 and the patient's care was started at 4:29 PM.   Chief Complaint  Patient presents with  . Mouth Lesions   The history is provided by the patient. No language interpreter was used.   HPI Comments:  Paul House is a 23 y.o. male who presents to the Emergency Department complaining of mouth lesions that started about one week ago that occur every two to three months. Pt states he was seen here and told he would have to see a specialist. He states he went to the health department and was told to come back to [facility] because they cannot help him. He states he has been taking prednisone that he didn't finish from his last episode but did not finish.He denies lesions anywhere else on his body. He denies fever, nausea, vomiting, or diarrhea.  Past Medical History  Diagnosis Date  . Behcet's disease   . MRSA (methicillin resistant staph aureus) culture positive   . Herpes    Past Surgical History  Procedure Laterality Date  . Biopsy pharynx     Family History  Problem Relation Age of Onset  . Heart disease Mother   . Lupus Neg Hx   . Crohn's disease Neg Hx    History  Substance Use Topics  . Smoking status: Current Every Day Smoker -- 0.25 packs/day    Types: Cigarettes  . Smokeless tobacco: Never Used  . Alcohol Use: Yes     Comment: occ    Review of Systems  Constitutional: Negative for fever.  HENT: Positive for mouth sores.   Gastrointestinal: Negative for nausea, vomiting and diarrhea.  All other systems reviewed and are negative.   Allergies  Review of patient's allergies indicates no known allergies.  Home Medications   Prior to Admission medications   Medication Sig Start Date End Date Taking? Authorizing  Provider  HYDROcodone-acetaminophen (NORCO) 5-325 MG per tablet Take 2 tablets by mouth every 4 (four) hours as needed. 12/14/13   Doug Sou, MD  predniSONE (DELTASONE) 20 MG tablet 3 tabs po daily x 3 days, then 2 tabs x 3 days, then 1.5 tabs x 3 days, then 1 tab x 3 days, then 0.5 tabs x 3 days 12/14/13   Doug Sou, MD   Triage Vitals: BP 117/87  Pulse 78  Temp(Src) 98.5 F (36.9 C) (Oral)  Resp 20  SpO2 100% Physical Exam  Nursing note and vitals reviewed. Constitutional: He is oriented to person, place, and time. He appears well-developed and well-nourished.  HENT:  Head: Normocephalic and atraumatic.  A few apthous ulcer noted to inside lower lip and the palate  Eyes: Conjunctivae and EOM are normal.  Neck: Normal range of motion.  Cardiovascular: Normal rate.   Pulmonary/Chest: Effort normal.  Genitourinary:  No penile sores  Musculoskeletal: Normal range of motion.  Neurological: He is alert and oriented to person, place, and time.  Skin: Skin is warm and dry.  Psychiatric: He has a normal mood and affect. His behavior is normal.    ED Course  Procedures (including critical care time) DIAGNOSTIC STUDIES: Oxygen Saturation is 100% on RA, normal by my interpretation.   COORDINATION OF CARE: 4:34 PM- Will prescribe steroids. Pt verbalizes understanding and agrees to plan.  Medications  methylPREDNISolone sodium succinate (SOLU-MEDROL) 125 mg/2 mL injection 125 mg (125 mg Intramuscular Given 02/28/14 1658)    Labs Review Labs Reviewed  CBC  COMPREHENSIVE METABOLIC PANEL   Imaging Review No results found.   EKG Interpretation None      MDM   Final diagnoses:  Behcet recurrent disease   Pt is tolerating po without any problem. Will send home on steroids and pain medication. Spoke to care management so that pt can have follow up with a pcp  I personally performed the services described in this documentation, which was scribed in my presence. The  recorded information has been reviewed and is accurate.     Teressa LowerVrinda Delorice Bannister, NP 02/28/14 1739  Teressa LowerVrinda Eulon Allnutt, NP 02/28/14 1740

## 2014-02-28 NOTE — Discharge Instructions (Signed)
Behçet's Disease °Behçet's disease is a rare, chronic, lifelong disorder. It involves inflammation of blood vessels throughout the body.  °CAUSES  °The exact cause is unknown. It is believed that an autoimmune reaction may cause blood vessels to become inflamed. It is not clear what triggers this reaction. °SYMPTOMS  °Symptoms of Behçet's disease include: °· Returning (recurrent) genital ulcers and oral ulcers that resemble canker sores.   °· Eye inflammation.   °The disorder may also cause:  °· Various types of skin sores (lesions).   °· Arthritis.   °· Bowel inflammation.   °· Inflammation of the membranes of the brain and spinal cord (meningitis).   °This disease generally begins when patients are in their 20s or 30s. But all age groups may be affected. Behçet's is a multisystem disease. It may involve all organs and affect the central nervous system. This may cause:  °· Memory loss.   °· Impaired speech, balance, and movement.   °TREATMENT  °There is no cure for Behçet's disease. Treatment typically focuses on reducing discomfort and preventing serious complications. The effects of the disease may include blindness, stroke, swelling of the spinal cord, or intestinal complications. Corticosteroids and other medications that suppress the immune system may be given to treat inflammation.  °Behçet's recurs or keeps causing problems (chronic). But patients may have periods of time when symptoms go away temporarily (remission). How bad the disease gets is different from patient to patient. Some patients may live normal lives. Others may become blind or severely disabled. °FOR MORE INFORMATION °American Behçet's Disease Association: www.behcets.com °Document Released: 09/15/2002 Document Revised: 07/16/2013 Document Reviewed: 07/26/2005 °ExitCare® Patient Information ©2014 [employer]. ° °

## 2014-02-28 NOTE — ED Notes (Signed)
He states he has sore mouth lesions; and he has known Behcet's dis.  He states he requires periodic steroids for these "outbreaks".  He states on a former visit, we instructed him to follow-up at the Health Department, but they told him "they couldn't help me with this--I'd have to go back to [facility]  He denies any other sign of somatic illness, including fever/n/v/d.

## 2014-03-01 NOTE — ED Provider Notes (Signed)
Medical screening examination/treatment/procedure(s) were performed by non-physician practitioner and as supervising physician I was immediately available for consultation/collaboration.   EKG Interpretation None        Itza Maniaci S Alveria Mcglaughlin, MD 03/01/14 0001 

## 2014-03-23 ENCOUNTER — Encounter (HOSPITAL_COMMUNITY): Payer: Self-pay | Admitting: Emergency Medicine

## 2014-03-23 ENCOUNTER — Emergency Department (HOSPITAL_COMMUNITY)
Admission: EM | Admit: 2014-03-23 | Discharge: 2014-03-23 | Disposition: A | Discharge status: Home / Self Care | Payer: PRIVATE HEALTH INSURANCE | Attending: Emergency Medicine | Admitting: Emergency Medicine

## 2014-03-23 DIAGNOSIS — K121 Other forms of stomatitis: Secondary | ICD-10-CM

## 2014-03-23 DIAGNOSIS — K1370 Unspecified lesions of oral mucosa: Secondary | ICD-10-CM | POA: Insufficient documentation

## 2014-03-23 DIAGNOSIS — F17200 Nicotine dependence, unspecified, uncomplicated: Secondary | ICD-10-CM | POA: Insufficient documentation

## 2014-03-23 DIAGNOSIS — M352 Behcet's disease: Secondary | ICD-10-CM

## 2014-03-23 MED ORDER — HYDROCODONE-ACETAMINOPHEN 5-325 MG PO TABS: 1.0000 | ORAL_TABLET | ORAL | Status: DC | PRN

## 2014-03-23 MED ORDER — PREDNISONE 20 MG PO TABS
60.0000 mg | ORAL_TABLET | Freq: Once | ORAL | Status: AC
  Administered 2014-03-23: 60 mg via ORAL
  Filled 2014-03-23: qty 3

## 2014-03-23 MED ORDER — METHYLPREDNISOLONE (PAK) 4 MG PO TABS: ORAL_TABLET | ORAL | Status: DC

## 2014-03-23 NOTE — ED Provider Notes (Signed)
TIME SEEN: 3:19 PM  CHIEF COMPLAINT: mouth lesions and rash on arms   HPI: Pt is a 23 y.o. M with h/o Behcet's who is seen in the ED today for ulcers in his mouth and lesions on his arms for the past 5 days that is similar to his Behcet's.  He is not on any long term immunosuppressants because he has no insurance.  [facility] who he has not seen due to financial reasons.  He has no PCP in this area and his wife states "we don't have then money for the copay."  Pt denies fever, chills, CP, SOB, abd pain, N/V/D, difficulty swallowing, speaking or breathing.  No new meds or exposures.  No tick bites.    ROS: See HPI Constitutional: no fever  Eyes: no drainage  ENT: no runny nose   Cardiovascular:  no chest pain  Resp: no SOB  GI: no vomiting GU: no dysuria Integumentary:  rash  Allergy: no hives  Musculoskeletal: no leg swelling  Neurological: no slurred speech ROS otherwise negative  PAST MEDICAL HISTORY/PAST SURGICAL HISTORY:  Past Medical History  Diagnosis Date  . Behcet's disease   . MRSA (methicillin resistant staph aureus) culture positive   . Herpes     MEDICATIONS:  Prior to Admission medications   Not on File    ALLERGIES:  No Known Allergies  SOCIAL HISTORY:  History  Substance Use Topics  . Smoking status: Current Every Day Smoker -- 0.25 packs/day    Types: Cigarettes  . Smokeless tobacco: Never Used  . Alcohol Use: Yes     Comment: occ    FAMILY HISTORY: Family History  Problem Relation Age of Onset  . Heart disease Mother   . Lupus Neg Hx   . Crohn's disease Neg Hx     EXAM: BP 124/80  Pulse 87  Temp(Src) 97.8 F (36.6 C) (Oral)  Resp 16  SpO2 95% CONSTITUTIONAL: Alert and oriented and responds appropriately to questions. Well-appearing; well-nourished; non-toxic; in no distress; looking through pictures on his phone while I am in the room HEAD: Normocephalic EYES: Conjunctivae clear, PERRL ENT: normal nose; no  rhinorrhea; moist mucous membranes; pharynx without lesions noted; multiple ulcerated lesions on the tongue and roof of the mouth with no drainage, no trismus or drooling, pt has normal voice NECK: Supple, no meningismus, no LAD  CARD: RRR; S1 and S2 appreciated; no murmurs, no clicks, no rubs, no gallops RESP: Normal chest excursion without splinting or tachypnea; breath sounds clear and equal bilaterally; no wheezes, no rhonchi, no rales,  ABD/GI: Normal bowel sounds; non-distended; soft, non-tender, no rebound, no guarding BACK:  The back appears normal and is non-tender to palpation, there is no CVA tenderness EXT: Normal ROM in all joints; non-tender to palpation; no edema; normal capillary refill; no cyanosis    SKIN: Normal color for age and race; warm; multiple small papular lesions on the arms with erythematous base and no drainage, no blisters or desquamation NEURO: Moves all extremities equally PSYCH: The patient's mood and manner are appropriate. Grooming and personal hygiene are appropriate.  MEDICAL DECISION MAKING: Pt here with exacerbation of his Behcet's.  He is well appearing, hemodynamically stable, has no difficulty swallowing or speaking.  Requesting medrol dose pack.  Will dc with steroids, pain meds.  Kim with CM has seen pt and given outpt resources.  Will give outpt resource guide as well.  D/w pt and wife strict return precautions and supportive care  instructions. Pt and wife verbalize understanding and are comfortable with plan.      Layla MawKristen N Ward, DO 03/23/14 1556

## 2014-03-23 NOTE — ED Notes (Signed)
Pt has hx of behcets.  Pt has been having mouth and skin lesions.  Started up on Friday.  Same symptoms in past which was treated with steroids.

## 2014-03-23 NOTE — Discharge Instructions (Signed)
Behet's Disease Behet's disease is a rare, chronic, lifelong disorder. It involves inflammation of blood vessels throughout the body.  CAUSES  The exact cause is unknown. It is believed that an autoimmune reaction may cause blood vessels to become inflamed. It is not clear what triggers this reaction. SYMPTOMS  Symptoms of Behet's disease include:  Returning (recurrent) genital ulcers and oral ulcers that resemble canker sores.   Eye inflammation.  The disorder may also cause:   Various types of skin sores (lesions).   Arthritis.   Bowel inflammation.   Inflammation of the membranes of the brain and spinal cord (meningitis).  This disease generally begins when patients are in their 820s or 30s. But all age groups may be affected. Behet's is a multisystem disease. It may involve all organs and affect the central nervous system. This may cause:   Memory loss.   Impaired speech, balance, and movement.  TREATMENT  There is no cure for Behet's disease. Treatment typically focuses on reducing discomfort and preventing serious complications. The effects of the disease may include blindness, stroke, swelling of the spinal cord, or intestinal complications. Corticosteroids and other medications that suppress the immune system may be given to treat inflammation.  Behet's recurs or keeps causing problems (chronic). But patients may have periods of time when symptoms go away temporarily (remission). How bad the disease gets is different from patient to patient. Some patients may live normal lives. Others may become blind or severely disabled. FOR MORE INFORMATION American Behet's Disease Association: www.behcets.com Document Released: 09/15/2002 Document Revised: 07/16/2013 Document Reviewed: 07/26/2005 [facility] Patient Information 2014 [place]Care, [place].    Emergency Department Resource Guide 1) Find a Doctor and Pay Out of Pocket Although you won't have to find out who is  covered by your insurance plan, it is a good idea to ask around and get recommendations. You will then need to call the office and see if the doctor you have chosen will accept you as a new patient and what types of options they offer for patients who are self-pay. Some doctors offer discounts or will set up payment plans for their patients who do not have insurance, but you will need to ask so you aren't surprised when you get to your appointment.  2) Contact Your Local Health Department Not all health departments have doctors that can see patients for sick visits, but many do, so it is worth a call to see if yours does. If you don't know where your local health department is, you can check in your phone book. The CDC also has a tool to help you locate your state's health department, and many state websites also have listings of all of their local health departments.  3) Find a Walk-in Clinic If your illness is not likely to be very severe or complicated, you may want to try a walk in clinic. These are popping up all over the country in pharmacies, drugstores, and shopping centers. They're usually staffed by nurse practitioners or physician assistants that have been trained to treat common illnesses and complaints. They're usually fairly quick and inexpensive. However, if you have serious medical issues or chronic medical problems, these are probably not your best option.  No Primary Care Doctor: - Call Health Connect at  [phone] - they can help you locate a primary care doctor that  accepts your insurance, provides certain services, etc. - Physician Referral Service- [phone]  Chronic Pain Problems: Organization         Address  Phone   Notes  [facility]  [phone] Patients need to be referred by their primary care doctor.   Medication Assistance: Organization         Address  Phone   Notes  [facility] Medication [facility] [address] [place] [place], [place] [address] [phone] --Must be a resident of [facility] -- Must have NO insurance coverage whatsoever (no Medicaid/ Medicare, etc.) -- The pt. MUST have a primary care doctor that directs their care regularly and follows them in the community   [facility]ist  [phone]   [employer]  [phone]    Agencies that provide inexpensive medical care: Organization         Address  Phone   Notes  [facility]  [phone]   [facility]    [phone]   [facility] [address] [place], [place] [address] [phone]   [facility] [address] [place], [place] [phone]   Planned Parenthood    [phone]   [facility]    [phone]   [facility]  [address], [place] Phone:  [phone], Fax:  [phone] Hours of Operation:  9 am - 6 pm, M-F.  Also accepts Medicaid/Medicare and self-pay.  [facility]  [address], [place] Phone: [phone], Fax: [phone]. Hours of Operation:  8:30 am - 5:30 pm, M-F.  Also accepts Medicaid and self-pay.  [facility] [place] [address] [place] Phone: [phone]   [facility] [address] Natasha Bence [place], [place] [phone], Ext. [phone] Mondays & Thursdays: 7-9 AM.  First 15 patients are seen on a first come, first serve basis.    Medicaid-accepting [facility] Providers:  Organization         Address  Phone   Notes  [facility] [address], [place] [phone] Also accepts self-pay patients.  [facility] [address] Laurell Josephs [place], [place]  [phone]   [facility] [address], [place] [phone]   [facility] [address] [place] [phone]   Renaye Rakers [address], [place]   [phone] Only accepts [place] Access [place] patients after they have their name applied to their card.   Self-Pay (no insurance) in [facility]:  Organization         Address  Phone   Notes  Sickle Cell Patients, [facility] [address] [place], [place] [phone]   [facility] Urgent Care [address] [place], [place] [phone]   [facility]  [address], [place] [phone]   [facility]/Dr. Osei-Bonsu  [address], [place] or [address], [place] [phone] Phone number for both [place] and [place] locations is the same.  [facility] [address] [place] [phone]   [facility] [address] [place] or [address] [phone]   [facility] [address] [place] [phone], phone; [phone], fax Sees patients 1st and 3rd Saturday of every month.  Must not qualify for public  or private insurance (i.e. Medicaid, Medicare, [place] Health Choice, Veterans' Benefits)  Household income should be no more than 200% of the poverty level The clinic cannot treat you if you are pregnant or think you are pregnant  Sexually transmitted diseases are not treated at the clinic.    Dental Care: Organization         Address  Phone  Notes  [facility] [address], [place] [phone] Accepts children up to age 25 who are enrolled in [place]icaid or [place] Health Choice; pregnant women with a Medicaid card; and children who have applied for Medicaid or [place] Health Choice, but were declined, whose parents can pay a reduced fee at time of service.  [facility] [place]  [address], [place] [phone] Accepts children up to age 25 who are enrolled  in [place]icaid or [place] Health Choice; pregnant women with a Medicaid card; and children who have applied for Medicaid or [place] Health Choice, but were declined, whose parents can pay a reduced fee at time of service.  [facility]  [address], [place] [phone] Patients are seen by appointment only. Walk-ins are not accepted. [facility] will see patients 25 years of age and older. Monday - Tuesday (8am-5pm) Most Wednesdays (8:30-5pm) $30 per visit, cash only  [facility]  [address], [facility] [phone] Patients are seen by appointment only. Walk-ins are not accepted. [facility] will see patients 25 years of age and older. One Wednesday Evening (Monthly: Volunteer Based).  $30 per visit, cash only  [facility] [facility]  [phone] for adults; Children under age 364, call Graduate Pediatric Dentistry at [phone]. Children aged 604-14, please call [phone] to request a pediatric application.  Dental services are provided in all areas of dental care including fillings, crowns and bridges, complete and partial dentures, implants, gum treatment, root canals, and extractions. Preventive care is also provided. Treatment is provided to both adults and children. Patients are selected via a lottery and there is often a waiting list.   [facility] [address], [place]  [phone] [email or web address]   [facility] [address], [place], [place] [phone], Ext. [phone] Second and Fourth Thursday of each month, opens at 6:30 AM; Clinic ends at 9 AM.  Patients are seen on a first-come first-served basis, and a limited number are seen during each clinic.   [facility]  [address], [place], [place] [phone]   Eligibility Requirements You must have lived in [place], [place], or [place] counties for at least the last three months.   You cannot be  eligible for state or federal sponsored National Cityhealthcare insurance, including CIGNAVeterans Administration, [place]icaid, or [employer] EntertainmentMedicare.   You generally cannot be eligible for healthcare insurance through your employer.    How to apply: Eligibility screenings are held every Tuesday and Wednesday afternoon from 1:00 pm until 4:00 pm. You do not need an appointment for the interview!  [facility] [address], [place], [place] [phone]   [facility]  [phone]   [facility]  [phone]   [facility]  [phone]    Behavioral Health Resources in the Community: Intensive Outpatient Programs Organization         Address  Phone  Notes  [facility] [address] [place], [place] [phone]   [facility] [address] [place], [place] [phone]   [facility] [address], [place], [place]  [phone]   [facility] Mental Health [address]  [place], [place] [phone] or [phone]   Substance Abuse Resources Organization         Address  Phone  Notes  [facility]  [phone]   [facility]  [phone]   [facility] [place]  [phone]   Floydene Flock  [phone]   Residential & Outpatient Substance Abuse Program  [phone]   Psychological Services Organization         Address  Phone  Notes  [facility] Behavioral Health  [phone]   [facility]  [phone]   [facility] Mental Health [address], [place] [phone] or [phone]    Mobile Crisis Teams Organization         Address  Phone  Notes  [facility], Mobile Crisis Care Unit  [phone]   [facility]  [address] [place], [place] [phone]   Doristine Locks [address] [place] [phone]    Self-Help/Support Groups Organization          Address  Phone             Notes  [facility] [place] - variety of support groups  [phone]- I7437963 Call for more information  Narcotics Anonymous (NA), [facility] [address], [employer] [place]  2 meetings at this location   Statistician         Address  Phone  Notes  [facility] [address],    [place]  [phone]   [facility]  [address], [place] [address], [place], [place] [phone]   [facility] [address] [place], [place] [phone] Admissions: 8am-3pm M-F  [facility] [address],    [place], [place] [phone]   [facility] [address] [place], [place], [place] [phone]   [facility] [address],  [place], [place] [phone]   [facility] - Intensive Outpatient [address]ure[address], [place], [place] [phone]   [facility] ([facility] [address] [place].,  [place], [place] [phone] or [phone]   [facility] ([facility]) [address], [place], [place] [phone] Accepts Medicaid  [facility] [address]  [place] [phone] Substance Abuse/Addiction Treatment   [facility] Organization         Address  Phone  Notes  [facility]  [phone]   Angie Fava, PhD [address] Ervin Knack [place], [place]   [phone] or [phone]   [facility] Behavioral   [address] [place], [place] [phone]   [facility] [address] [place], [place] [phone] Insurance/Medicaid/sponsorship through [facility] and Families [address]                                    [place], [place] [phone] Therapy/tele-psych/case  [facility] [address], [place] [phone]    Dr. Lolly Mustache  [phone]   [facility]  [facility]  Health Dept. 1) [address] [place] 2) [place] 3)  [place] [address], [place] [phone]  [phone]   [place] [phone] or [phone] (After Hours)

## 2014-03-23 NOTE — Progress Notes (Signed)
[facility] CL provided pt with a list of primary care resources to help patient establish a pcp. Patient [place]. CL consulted with [facility] ED CM to provide pt with resources for [facility].

## 2014-03-23 NOTE — Progress Notes (Signed)
  CARE MANAGEMENT ED NOTE 03/23/2014  Patient:  Paul House,Paul House   Account Number:  [id number]  Date Initiated:  03/23/2014  Documentation initiated by:  Edd ArbourGIBBS,KIMBERLY  Subjective/Objective Assessment:   23 yr old self pay [place] address Hx of behcets, c/o mouth and skin lesions previously treated with sterids  Pt with 4 ED visits and 1 admission     Subjective/Objective Assessment Detail:   [facility] spoke with this pt on 02/28/14 about [facility]  seen on 03/24/14 by [facility] staff Refer to her note on 6/16/1     Action/Plan:   ED CM consulted by [facility] staff for assist with [facility] self pay resources ED spoke with pt and male at bedside Cm provided him with self pay information for [place]   Action/Plan Detail:   Anticipated DC Date:  03/23/2014     Status Recommendation to Physician:   Result of Recommendation:    Other ED Services  Consult Working Plan    DC Planning Services  Other  PCP issues  Outpatient Services - Pt will follow up  [facility] / [facility] (established/new)    Choice offered to / List presented to:            Status of service:  Completed, signed off  ED Comments:   ED Comments Detail:  CM spoke with pt who confirms self pay [facility] [place] resident with no pcp. CM discussed and provided written information for self pay pcps, importance of pcp for f/u care, www.needymeds.org, discounted pharmacies and other [place] resources such as financial assistance, DSS and  health department  Reviewed resources for [place] self pay pcps like [facility] [place], family medicine at [address], [facility] family [facility], general medical clinics, [facility] urgent care plus others, [facility] out patient pharmacies and housing Pt voiced understanding and appreciation of resources provided

## 2014-04-23 ENCOUNTER — Encounter (HOSPITAL_COMMUNITY): Payer: Self-pay | Admitting: Emergency Medicine

## 2014-04-23 ENCOUNTER — Emergency Department (HOSPITAL_COMMUNITY)
Admission: EM | Admit: 2014-04-23 | Discharge: 2014-04-23 | Disposition: A | Discharge status: Home / Self Care | Payer: PRIVATE HEALTH INSURANCE | Attending: Emergency Medicine | Admitting: Emergency Medicine

## 2014-04-23 DIAGNOSIS — M352 Behcet's disease: Secondary | ICD-10-CM | POA: Insufficient documentation

## 2014-04-23 DIAGNOSIS — F17200 Nicotine dependence, unspecified, uncomplicated: Secondary | ICD-10-CM | POA: Insufficient documentation

## 2014-04-23 DIAGNOSIS — Z8614 Personal history of Methicillin resistant Staphylococcus aureus infection: Secondary | ICD-10-CM | POA: Insufficient documentation

## 2014-04-23 MED ORDER — HYDROCODONE-ACETAMINOPHEN 5-325 MG PO TABS
2.0000 | ORAL_TABLET | Freq: Once | ORAL | Status: AC
  Administered 2014-04-23: 2 via ORAL
  Filled 2014-04-23: qty 2

## 2014-04-23 MED ORDER — MAGIC MOUTHWASH
5.0000 mL | Freq: Once | ORAL | Status: AC
  Administered 2014-04-23: 5 mL via ORAL
  Filled 2014-04-23: qty 5

## 2014-04-23 MED ORDER — ONDANSETRON 8 MG PO TBDP
8.0000 mg | ORAL_TABLET | Freq: Once | ORAL | Status: AC
  Administered 2014-04-23: 8 mg via ORAL
  Filled 2014-04-23: qty 1

## 2014-04-23 MED ORDER — PREDNISONE 20 MG PO TABS: 40.0000 mg | ORAL_TABLET | Freq: Every day | ORAL | Status: DC

## 2014-04-23 MED ORDER — HYDROCODONE-ACETAMINOPHEN 5-325 MG PO TABS: 1.0000 | ORAL_TABLET | Freq: Four times a day (QID) | ORAL | Status: DC | PRN

## 2014-04-23 NOTE — Discharge Instructions (Signed)
Behcet Syndrome Behcet syndrome is a rare disorder that involves inflammation of blood vessels (vasculitis) throughout your body. This condition usually begins between the ages of 20 years and 40 years. Behcet syndrome can range from mild to severe and is a condition you will have for the rest of your life (chronic). There is no cure, but symptoms may go away on their own for periods of time. It can cause various symptoms, including open sores (ulcers) in your mouth or on your genitals. It can affect many organs and systems in your body, including your heart, lungs, digestive system, and nervous systems. It can sometimes lead to blindness. Behcet syndrome is not spread from person to person (contagious). CAUSES  The exact cause is unknown. The condition tends to run in families. Some genes that increase risk for Behcet syndrome have been identified. If you inherit these genes, it may increase your risk.  RISK FACTORS  Being of Asian, Middle Eastern, or [place] descent.  Being 20-40 years of age. SYMPTOMS  Signs and symptoms vary depending on the areas of the body that are affected. Early and common signs and symptoms include:   Open sores on your:  Mouth. These may look like canker sores. The sores may come and go.  Genitals. These may come and go and leave scars when they heal.  Skin. These may appear as painful red bumps or pimples.  Eye problems including:  Redness.  Blurred vision.  Tearing.  Pain.  Inflammation (uveitis).  Arthritis.   Swelling of the brain and spinal cord (meningoencephalitis). Less common signs and symptoms may develop over time and can include:   Abdominal pain and bleeding in your digestive system.  Memory loss.  Behavior changes.  Loss of interest in things you enjoy (apathy).  Seizures.  Blood clots.  Weakening of blood vessels (aneurysms).  Chest pain.  Trouble breathing.  Impaired speech, balance, and movement. DIAGNOSIS  Behcet  syndrome is hard to diagnose because there will be times when you are symptom free. Your health care provider may diagnose the condition based on your medical history and a physical exam. The main factors that help confirm the diagnosis are presence of mouth sores at least three times in 1 year and any two of the following:  Genital sores that keep coming back.  Eye inflammation with loss of vision.  Skin sores that are characteristic of Behcet syndrome.  A positive skin prick test. If you have the condition, a skin prick may produce a red bump in 1-2 days. Yourhealth care provider may perform additional tests, including:   CT or MRI scans of your joints, brain, or bones.  Blood vessel studies (angiogram).  Removing pieces of affected body tissue (biopsy) to check for vasculitis. TREATMENT  There is no cure for Behcet syndrome. Treatment typically focuses on relieving your discomfort and preventing serious complications. You may need to work with a team of health care providers because many different parts of your body may be involved. Common treatments include:  Strong anti-inflammatory medicines (corticosteroids).  Medicines that suppress your immune system and treat inflammation.  Steroid creams to treat oral and genital ulcers.  Other medicines your health care team may recommend based on your symptoms and the parts of your body involved. HOME CARE INSTRUCTIONS Follow all your health care provider's instructions. Theinstructions you get will depend on your specific symptoms and treatments. General instructions may include:  Get plenty of rest, especially when your symptoms worsen.  Get moderate exercise (  walking and swimming) when not experiencing worsening of symptoms.  Include lots of vegetables, fruits, and whole grains in your diet.  Avoid high-fat foods.  Do not smoke.  Keep all follow-up appointments. SEEK MEDICAL CARE IF:  Your symptoms worsen and are not  managed by medicines and home care. SEEK IMMEDIATE MEDICAL CARE IF:  You suddenly lose your vision.  You vomit blood or have blood in your stool.  You have very bad abdominal pain.  You suddenly have a very bad headache.  You have a seizure.  You have a red, warm, or tender swelling in your leg.  You have chest pain or trouble breathing. FOR MORE INFORMATION American Behcet's Disease Association: www.behcets.com Document Released: 09/15/2002 Document Revised: 09/30/2013 Document Reviewed: 08/26/2013 ExitCare Patient Information 2015 ExitCare, LLC. This information is not intended to replace advice given to you by your health care provider. Make sure you discuss any questions you have with your health care provider.  

## 2014-04-23 NOTE — ED Provider Notes (Signed)
CSN: [id number]     Arrival date & time 04/23/14  1105 History   First MD Initiated Contact with Patient 04/23/14 1131     Chief Complaint  Patient presents with  . Behcet's Disease      (Consider location/radiation/quality/duration/timing/severity/associated sxs/prior Treatment) HPI Comments: Patient is a 23 year old male with history of Behcet's disease, MRSA, herpes who presents today with painful mouth ulcerations for the past week. He reports that they have gradually worsened over this time. He also has an ulceration on his right arm. He was seen in the emergency department one month ago and given a Medrol Dosepak. He was also given Norco tabs. He reports that this initially worked, but that his symptoms reccurred. He does not currently take any immunosuppressive meds or chronic steroids. He denies fevers, chills, nausea, vomiting, abdominal pain, shortness of breath, difficulty swallowing.   The history is provided by the patient. No language interpreter was used.    Past Medical History  Diagnosis Date  . Behcet's disease   . MRSA (methicillin resistant staph aureus) culture positive   . Herpes    Past Surgical History  Procedure Laterality Date  . Biopsy pharynx     Family History  Problem Relation Age of Onset  . Heart disease Mother   . Lupus Neg Hx   . Crohn's disease Neg Hx    History  Substance Use Topics  . Smoking status: Current Every Day Smoker -- 0.25 packs/day    Types: Cigarettes  . Smokeless tobacco: Never Used  . Alcohol Use: Yes     Comment: occ    Review of Systems  Constitutional: Negative for fever and chills.  HENT: Positive for mouth sores. Negative for trouble swallowing.   Respiratory: Negative for shortness of breath.   Cardiovascular: Negative for chest pain.  Gastrointestinal: Negative for nausea, vomiting and abdominal pain.  Skin: Positive for color change and rash.  All other systems reviewed and are negative.     Allergies   Review of patient's allergies indicates no known allergies.  Home Medications   Prior to Admission medications   Not on File   BP 133/80  Pulse 74  Temp(Src) 98.4 F (36.9 C) (Oral)  Resp 20  SpO2 100% Physical Exam  Nursing note and vitals reviewed. Constitutional: He is oriented to person, place, and time. He appears well-developed and well-nourished. No distress.  HENT:  Head: Normocephalic and atraumatic.  Right Ear: External ear normal.  Left Ear: External ear normal.  Nose: Nose normal.  Mouth/Throat: Uvula is midline and oropharynx is clear and moist. Oral lesions present. No trismus in the jaw.  Ulcerated lesions to tongue, gums, and roof of mouth. No drainage. Patient is easily maintaining own secreations  Eyes: Conjunctivae are normal.  Neck: Normal range of motion and phonation normal. Neck supple. No rigidity. No tracheal deviation present.  Cardiovascular: Normal rate, regular rhythm and normal heart sounds.   Pulmonary/Chest: Effort normal and breath sounds normal. No stridor.  Abdominal: Soft. He exhibits no distension. There is no tenderness.  Musculoskeletal: Normal range of motion.  Neurological: He is alert and oriented to person, place, and time.  Skin: Skin is warm and dry. Lesion noted. He is not diaphoretic.  3 cm collection of papules with erythematous base on right proximal arm. 1 cm collection of papules with erythematous base to right forearm. No drainage.   Psychiatric: He has a normal mood and affect. His behavior is normal.    ED  Course  Procedures (including critical care time) Labs Review Labs Reviewed - No data to display  Imaging Review No results found.   EKG Interpretation None      MDM   Final diagnoses:  Behcet's disease   Patient presents to ED with exacerbation of Behcet's disease. Patient needs outpatient follow up with rheumatology and to be placed on maintenence meds. This was discussed with patient. At this time  patient is not having difficulty with PO intake, swallowing, breathing. No superimposed infection. Will discharge home with steroids and pain medication. Dr. Silverio Lay evaluated patient and agrees with plan. Discussed reasons to return to ED immediately. Vital signs stable for discharge. Patient / Family / Caregiver informed of clinical course, understand medical decision-making process, and agree with plan.     Mora Bellman, PA-C 04/23/14 [phone]

## 2014-04-23 NOTE — ED Notes (Signed)
Per pt, states he has Beckets disease which causes mouth pain-no problems with PO intake

## 2014-04-24 NOTE — ED Provider Notes (Signed)
Medical screening examination/treatment/procedure(s) were conducted as a shared visit with non-physician practitioner(s) and myself.  I personally evaluated the patient during the encounter.   EKG Interpretation None      Paul ReachChristopher Latella is a 23 y.o. male hx of Behcet's here with mouth ulcers. Doesn't have insurance so hasn't been seeing rheumatology. Was in the ED a month ago for same symptoms and improved with medrol dose pack. Denies trouble swallowing. Afebrile in the ED. Multiple mouth ulcers. No obvious cervical LAD. No hx of AIDS or candida. Will give another course of steroids, mouth wash, pain meds. I encouraged him to f/u with rheumatology.    Richardean Canalavid H Yao, MD 04/24/14 [phone]

## 2014-05-25 ENCOUNTER — Emergency Department (HOSPITAL_BASED_OUTPATIENT_CLINIC_OR_DEPARTMENT_OTHER)
Admission: EM | Admit: 2014-05-25 | Discharge: 2014-05-25 | Disposition: A | Discharge status: Home / Self Care | Payer: PRIVATE HEALTH INSURANCE | Attending: Emergency Medicine | Admitting: Emergency Medicine

## 2014-05-25 ENCOUNTER — Encounter (HOSPITAL_BASED_OUTPATIENT_CLINIC_OR_DEPARTMENT_OTHER): Payer: Self-pay | Admitting: Emergency Medicine

## 2014-05-25 DIAGNOSIS — K120 Recurrent oral aphthae: Secondary | ICD-10-CM | POA: Insufficient documentation

## 2014-05-25 DIAGNOSIS — Z8619 Personal history of other infectious and parasitic diseases: Secondary | ICD-10-CM | POA: Insufficient documentation

## 2014-05-25 DIAGNOSIS — K1370 Unspecified lesions of oral mucosa: Secondary | ICD-10-CM | POA: Insufficient documentation

## 2014-05-25 DIAGNOSIS — Z8614 Personal history of Methicillin resistant Staphylococcus aureus infection: Secondary | ICD-10-CM | POA: Insufficient documentation

## 2014-05-25 DIAGNOSIS — F17200 Nicotine dependence, unspecified, uncomplicated: Secondary | ICD-10-CM | POA: Insufficient documentation

## 2014-05-25 MED ORDER — NYSTATIN 100000 UNIT/ML MT SUSP: 500000.0000 [IU] | Freq: Four times a day (QID) | OROMUCOSAL | Status: DC

## 2014-05-25 MED ORDER — PREDNISONE 20 MG PO TABS: ORAL_TABLET | ORAL | Status: DC

## 2014-05-25 MED ORDER — NAPROXEN 375 MG PO TABS: 375.0000 mg | ORAL_TABLET | Freq: Two times a day (BID) | ORAL | Status: DC

## 2014-05-25 MED ORDER — MAGIC MOUTHWASH W/LIDOCAINE
5.0000 mL | Freq: Three times a day (TID) | ORAL | Status: DC
Start: 2014-05-25 — End: 2014-07-17

## 2014-05-25 NOTE — ED Notes (Signed)
Onset 2 days of mouth sores

## 2014-05-25 NOTE — ED Provider Notes (Signed)
CSN: [id number]     Arrival date & time 05/25/14  2138 History   This chart was scribed for Paul Baray [name], MD by Paul House, ED Scribe. The patient was seen in room MH09/MH09. Patient's care was started at 11:17 PM.   Chief Complaint  Patient presents with  . Mouth Lesions   Patient is a 23 y.o. male presenting with mouth sores. The history is provided by the patient. No language interpreter was used.  Mouth Lesions Location:  Tongue Quality:  White Onset quality:  Gradual Severity:  Mild Duration:  2 days Progression:  Unchanged Chronicity:  Recurrent Context: not a change in diet   Relieved by:  None tried Worsened by:  Nothing tried Ineffective treatments:  None tried Associated symptoms: no congestion and no fever    HPI Comments: Paul House is a 23 y.o. male, with a h/o Brehcet's disease, who presents to the Emergency Department complaining of mouth lesions first notes 2 days ago. Pt reports similar symptoms in the past. He states that he usually comes to the ED and is sent home with steroids.   Past Medical History  Diagnosis Date  . Behcet's disease   . MRSA (methicillin resistant staph aureus) culture positive   . Herpes    Past Surgical History  Procedure Laterality Date  . Biopsy pharynx     Family History  Problem Relation Age of Onset  . Heart disease Mother   . Lupus Neg Hx   . Crohn's disease Neg Hx    History  Substance Use Topics  . Smoking status: Current Every Day Smoker -- 0.25 packs/day    Types: Cigarettes  . Smokeless tobacco: Never Used  . Alcohol Use: Yes     Comment: occ    Review of Systems  Constitutional: Negative for fever.  HENT: Positive for mouth sores. Negative for congestion.   All other systems reviewed and are negative.  Allergies  Review of patient's allergies indicates no known allergies.  Home Medications   Prior to Admission medications   Not on File   Triage Vitals: BP 113/81  Pulse 80   Temp(Src) 98.2 F (36.8 C) (Oral)  Resp 18  Ht 6' (1.829 m)  Wt 156 lb (70.761 kg)  BMI 21.15 kg/m2  SpO2 99% Physical Exam  Nursing note and vitals reviewed. Constitutional: He is oriented to person, place, and time. He appears well-developed and well-nourished.  HENT:  Head: Normocephalic and atraumatic.  Mouth/Throat: Oral lesions present.   White spots with turquoise centers White coating on tongue consistent with thrush   Eyes: Conjunctivae and EOM are normal. Pupils are equal, round, and reactive to light.  Neck: Normal range of motion. Neck supple.  Cardiovascular: Normal rate and regular rhythm.   Pulmonary/Chest: Effort normal and breath sounds normal. He has no wheezes. He has no rales.  Abdominal: Soft. Bowel sounds are normal. There is no tenderness.  Musculoskeletal: Normal range of motion.  Neurological: He is alert and oriented to person, place, and time.  Skin: Skin is warm and dry.  Psychiatric: He has a normal mood and affect. His behavior is normal.   ED Course  Procedures (including critical care time) DIAGNOSTIC STUDIES: Oxygen Saturation is 99% on RA, normal by my interpretation.    COORDINATION OF CARE: 11:19 PM-Discussed treatment plan which includes steroids with pt at bedside and pt agreed to plan.   Labs Review Labs Reviewed - No data to display  Imaging Review No results found.  EKG Interpretation None      MDM   Final diagnoses:  None    Will treat for Behcets flare and thrush.  Follow up with your family doctor  I personally performed the services described in this documentation, which was scribed in my presence. The recorded information has been reviewed and is accurate.    Paul House K PaJasmine Awelumbo-Rasch, MD 05/26/14 [id number]

## 2014-05-26 ENCOUNTER — Encounter (HOSPITAL_BASED_OUTPATIENT_CLINIC_OR_DEPARTMENT_OTHER): Payer: Self-pay | Admitting: Emergency Medicine

## 2014-06-10 ENCOUNTER — Encounter (HOSPITAL_BASED_OUTPATIENT_CLINIC_OR_DEPARTMENT_OTHER): Payer: Self-pay | Admitting: Emergency Medicine

## 2014-06-10 ENCOUNTER — Emergency Department (HOSPITAL_BASED_OUTPATIENT_CLINIC_OR_DEPARTMENT_OTHER)
Admission: EM | Admit: 2014-06-10 | Discharge: 2014-06-10 | Disposition: A | Discharge status: Home / Self Care | Payer: PRIVATE HEALTH INSURANCE | Attending: Emergency Medicine | Admitting: Emergency Medicine

## 2014-06-10 DIAGNOSIS — M352 Behcet's disease: Secondary | ICD-10-CM | POA: Insufficient documentation

## 2014-06-10 DIAGNOSIS — IMO0002 Reserved for concepts with insufficient information to code with codable children: Secondary | ICD-10-CM | POA: Insufficient documentation

## 2014-06-10 DIAGNOSIS — K121 Other forms of stomatitis: Secondary | ICD-10-CM

## 2014-06-10 DIAGNOSIS — K1370 Unspecified lesions of oral mucosa: Secondary | ICD-10-CM | POA: Insufficient documentation

## 2014-06-10 DIAGNOSIS — Z8614 Personal history of Methicillin resistant Staphylococcus aureus infection: Secondary | ICD-10-CM | POA: Insufficient documentation

## 2014-06-10 DIAGNOSIS — Z791 Long term (current) use of non-steroidal anti-inflammatories (NSAID): Secondary | ICD-10-CM | POA: Insufficient documentation

## 2014-06-10 DIAGNOSIS — K120 Recurrent oral aphthae: Secondary | ICD-10-CM | POA: Insufficient documentation

## 2014-06-10 DIAGNOSIS — K1230 Oral mucositis (ulcerative), unspecified: Secondary | ICD-10-CM

## 2014-06-10 DIAGNOSIS — F17200 Nicotine dependence, unspecified, uncomplicated: Secondary | ICD-10-CM | POA: Insufficient documentation

## 2014-06-10 DIAGNOSIS — Z79899 Other long term (current) drug therapy: Secondary | ICD-10-CM | POA: Insufficient documentation

## 2014-06-10 MED ORDER — PREDNISONE 20 MG PO TABS: ORAL_TABLET | ORAL | Status: DC

## 2014-06-10 MED ORDER — HYDROCODONE-ACETAMINOPHEN 5-325 MG PO TABS
2.0000 | ORAL_TABLET | Freq: Once | ORAL | Status: AC
  Administered 2014-06-10: 2 via ORAL
  Filled 2014-06-10: qty 2

## 2014-06-10 MED ORDER — PREDNISONE 10 MG PO TABS
60.0000 mg | ORAL_TABLET | Freq: Once | ORAL | Status: AC
  Administered 2014-06-10: 60 mg via ORAL
  Filled 2014-06-10 (×2): qty 1

## 2014-06-10 MED ORDER — HYDROCODONE-ACETAMINOPHEN 5-325 MG PO TABS: 2.0000 | ORAL_TABLET | ORAL | Status: DC | PRN

## 2014-06-10 MED ORDER — MAGIC MOUTHWASH W/LIDOCAINE: 10.0000 mL | Freq: Four times a day (QID) | ORAL | Status: DC | PRN

## 2014-06-10 NOTE — Discharge Instructions (Signed)
Behcet Syndrome Behcet syndrome is a rare disorder that involves inflammation of blood vessels (vasculitis) throughout your body. This condition usually begins between the ages of 20 years and 40 years. Behcet syndrome can range from mild to severe and is a condition you will have for the rest of your life (chronic). There is no cure, but symptoms may go away on their own for periods of time. It can cause various symptoms, including open sores (ulcers) in your mouth or on your genitals. It can affect many organs and systems in your body, including your heart, lungs, digestive system, and nervous systems. It can sometimes lead to blindness. Behcet syndrome is not spread from person to person (contagious). CAUSES  The exact cause is unknown. The condition tends to run in families. Some genes that increase risk for Behcet syndrome have been identified. If you inherit these genes, it may increase your risk.  RISK FACTORS  Being of Asian, Middle Eastern, or [place] descent.  Being 20-40 years of age. SYMPTOMS  Signs and symptoms vary depending on the areas of the body that are affected. Early and common signs and symptoms include:   Open sores on your:  Mouth. These may look like canker sores. The sores may come and go.  Genitals. These may come and go and leave scars when they heal.  Skin. These may appear as painful red bumps or pimples.  Eye problems including:  Redness.  Blurred vision.  Tearing.  Pain.  Inflammation (uveitis).  Arthritis.   Swelling of the brain and spinal cord (meningoencephalitis). Less common signs and symptoms may develop over time and can include:   Abdominal pain and bleeding in your digestive system.  Memory loss.  Behavior changes.  Loss of interest in things you enjoy (apathy).  Seizures.  Blood clots.  Weakening of blood vessels (aneurysms).  Chest pain.  Trouble breathing.  Impaired speech, balance, and movement. DIAGNOSIS  Behcet  syndrome is hard to diagnose because there will be times when you are symptom free. Your health care provider may diagnose the condition based on your medical history and a physical exam. The main factors that help confirm the diagnosis are presence of mouth sores at least three times in 1 year and any two of the following:  Genital sores that keep coming back.  Eye inflammation with loss of vision.  Skin sores that are characteristic of Behcet syndrome.  A positive skin prick test. If you have the condition, a skin prick may produce a red bump in 1-2 days. Yourhealth care provider may perform additional tests, including:   CT or MRI scans of your joints, brain, or bones.  Blood vessel studies (angiogram).  Removing pieces of affected body tissue (biopsy) to check for vasculitis. TREATMENT  There is no cure for Behcet syndrome. Treatment typically focuses on relieving your discomfort and preventing serious complications. You may need to work with a team of health care providers because many different parts of your body may be involved. Common treatments include:  Strong anti-inflammatory medicines (corticosteroids).  Medicines that suppress your immune system and treat inflammation.  Steroid creams to treat oral and genital ulcers.  Other medicines your health care team may recommend based on your symptoms and the parts of your body involved. HOME CARE INSTRUCTIONS Follow all your health care provider's instructions. Theinstructions you get will depend on your specific symptoms and treatments. General instructions may include:  Get plenty of rest, especially when your symptoms worsen.  Get moderate exercise (  walking and swimming) when not experiencing worsening of symptoms.  Include lots of vegetables, fruits, and whole grains in your diet.  Avoid high-fat foods.  Do not smoke.  Keep all follow-up appointments. SEEK MEDICAL CARE IF:  Your symptoms worsen and are not  managed by medicines and home care. SEEK IMMEDIATE MEDICAL CARE IF:  You suddenly lose your vision.  You vomit blood or have blood in your stool.  You have very bad abdominal pain.  You suddenly have a very bad headache.  You have a seizure.  You have a red, warm, or tender swelling in your leg.  You have chest pain or trouble breathing. FOR MORE INFORMATION American Behcet's Disease Association: www.behcets.com Document Released: 09/15/2002 Document Revised: 09/30/2013 Document Reviewed: 08/26/2013 ExitCare Patient Information 2015 ExitCare, LLC. This information is not intended to replace advice given to you by your health care provider. Make sure you discuss any questions you have with your health care provider.  

## 2014-06-10 NOTE — ED Notes (Signed)
C/o mouth sores x 2 day

## 2014-06-10 NOTE — ED Provider Notes (Signed)
CSN: [id number]     Arrival date & time 06/10/14  2052 History  This chart was scribed for Paul House, * by Modena Jansky, ED Scribe. This patient was seen in room MH09/MH09 and the patient's care was started at 11:23 PM.   Chief Complaint  Patient presents with  . Mouth Lesions   The history is provided by the patient. No language interpreter was used.   HPI Comments: Paul House is a 23 y.o. male who presents to the Emergency Department complaining of mouth lesion that started 2 days ago. He reports that he frequently has mouth lesions. He states that he is usually treated with prednisone. He reports no lesions on other parts of his body.   Past Medical History  Diagnosis Date  . Behcet's disease   . MRSA (methicillin resistant staph aureus) culture positive   . Herpes    Past Surgical History  Procedure Laterality Date  . Biopsy pharynx     Family History  Problem Relation Age of Onset  . Heart disease Mother   . Lupus Neg Hx   . Crohn's disease Neg Hx    History  Substance Use Topics  . Smoking status: Current Every Day Smoker -- 0.25 packs/day    Types: Cigarettes  . Smokeless tobacco: Never Used  . Alcohol Use: Yes     Comment: occ    Review of Systems  HENT: Positive for mouth sores.   All other systems reviewed and are negative.   Allergies  Review of patient's allergies indicates no known allergies.  Home Medications   Prior to Admission medications   Medication Sig Start Date End Date Taking? Authorizing Provider  Alum & Mag Hydroxide-Simeth (MAGIC MOUTHWASH W/LIDOCAINE) SOLN Take 5 mLs by mouth 3 (three) times daily. 05/25/14   April K Palumbo-Rasch, MD  Alum & Mag Hydroxide-Simeth (MAGIC MOUTHWASH W/LIDOCAINE) SOLN Take 10 mLs by mouth 4 (four) times daily as needed for mouth pain. 06/10/14   Paul Crease, MD  HYDROcodone-acetaminophen (NORCO/VICODIN) 5-325 MG per tablet Take 2 tablets by mouth every 4 (four) hours as needed  for moderate pain. 06/10/14   Paul Crease, MD  naproxen (NAPROSYN) 375 MG tablet Take 1 tablet (375 mg total) by mouth 2 (two) times daily. 05/25/14   April K Palumbo-Rasch, MD  nystatin (MYCOSTATIN) 100000 UNIT/ML suspension Take 5 mLs (500,000 Units total) by mouth 4 (four) times daily. 05/25/14   April K Palumbo-Rasch, MD  predniSONE (DELTASONE) 20 MG tablet 3 tabs po day one, then 2 po daily x 4 days 05/25/14   April K Palumbo-Rasch, MD  predniSONE (DELTASONE) 20 MG tablet 3 tabs po daily x 3 days, then 2 tabs x 3 days, then 1.5 tabs x 3 days, then 1 tab x 3 days, then 0.5 tabs x 3 days 06/10/14   Paul Crease, MD   BP 128/77  Pulse 98  Temp(Src) 98.4 F (36.9 C) (Oral)  Resp 18  Ht 6' (1.829 m)  Wt 156 lb (70.761 kg)  BMI 21.15 kg/m2  SpO2 98% Physical Exam  Nursing note and vitals reviewed. Constitutional: He is oriented to person, place, and time. He appears well-developed and well-nourished. No distress.  HENT:  Head: Normocephalic and atraumatic.  Right Ear: Hearing normal.  Left Ear: Hearing normal.  Nose: Nose normal.  Mouth/Throat: Oropharynx is clear and moist and mucous membranes are normal.  Erythema and ulceration on buccal mucosa and lateral tongue. Oropharynx is clear without edema. Airway patent.  Eyes: Conjunctivae and EOM are normal. Pupils are equal, round, and reactive to light.  Neck: Normal range of motion. Neck supple.  Cardiovascular: Regular rhythm, S1 normal and S2 normal.  Exam reveals no gallop and no friction rub.   No murmur heard. Pulmonary/Chest: Effort normal and breath sounds normal. No respiratory distress. He exhibits no tenderness.  Abdominal: Soft. Normal appearance and bowel sounds are normal. There is no hepatosplenomegaly. There is no tenderness. There is no rebound, no guarding, no tenderness at McBurney's point and negative Murphy's sign. No hernia.  Musculoskeletal: Normal range of motion.  Neurological: He is alert and  oriented to person, place, and time. He has normal strength. No cranial nerve deficit or sensory deficit. Coordination normal. GCS eye subscore is 4. GCS verbal subscore is 5. GCS motor subscore is 6.  Skin: Skin is warm, dry and intact. No rash noted. No cyanosis.  Psychiatric: He has a normal mood and affect. His speech is normal and behavior is normal. Thought content normal.    ED Course  Procedures (including critical care time) DIAGNOSTIC STUDIES: Oxygen Saturation is 98% on RA, normal by my interpretation.    COORDINATION OF CARE: 11:27 PM- Pt advised of plan for treatment and pt agrees.  Labs Review Labs Reviewed - No data to display  Imaging Review No results found.   EKG Interpretation None      MDM   Final diagnoses:  Behcet's disease  Stomatitis and mucositis  Aphthous ulcer of mouth   Patient presents to the ER for evaluation of sores in his mouth. Symptoms began 2 days ago. Patient has had recurrent problems secondary to Behcet's disease. He does not have any airway compromise. He is swallowing without difficulty. No other lesions noted. Vital signs are stable. No testing is necessary, patient has had multiple presentations with similar symptoms. Patient will be treated with topical lidocaine, hydrocodone, prednisone. Return if symptoms worsen.  I personally performed the services described in this documentation, which was scribed in my presence. The recorded information has been reviewed and is accurate.      Paul Crease, MD 06/11/14 [phone]

## 2014-06-10 NOTE — ED Notes (Signed)
Sores on rt lower jaw on inside,  states hx of same  Dx w behcets disease last year at [facility]

## 2014-07-17 ENCOUNTER — Encounter (HOSPITAL_COMMUNITY): Payer: Self-pay | Admitting: Emergency Medicine

## 2014-07-17 ENCOUNTER — Emergency Department (HOSPITAL_COMMUNITY)
Admission: EM | Admit: 2014-07-17 | Discharge: 2014-07-17 | Disposition: A | Discharge status: Home / Self Care | Payer: PRIVATE HEALTH INSURANCE | Attending: Emergency Medicine | Admitting: Emergency Medicine

## 2014-07-17 DIAGNOSIS — K1370 Unspecified lesions of oral mucosa: Secondary | ICD-10-CM | POA: Diagnosis present

## 2014-07-17 DIAGNOSIS — Z8619 Personal history of other infectious and parasitic diseases: Secondary | ICD-10-CM | POA: Insufficient documentation

## 2014-07-17 DIAGNOSIS — M352 Behcet's disease: Secondary | ICD-10-CM | POA: Diagnosis not present

## 2014-07-17 DIAGNOSIS — Z79899 Other long term (current) drug therapy: Secondary | ICD-10-CM | POA: Insufficient documentation

## 2014-07-17 DIAGNOSIS — Z8614 Personal history of Methicillin resistant Staphylococcus aureus infection: Secondary | ICD-10-CM | POA: Insufficient documentation

## 2014-07-17 DIAGNOSIS — Z720 Tobacco use: Secondary | ICD-10-CM | POA: Insufficient documentation

## 2014-07-17 DIAGNOSIS — Z791 Long term (current) use of non-steroidal anti-inflammatories (NSAID): Secondary | ICD-10-CM | POA: Diagnosis not present

## 2014-07-17 MED ORDER — MAGIC MOUTHWASH W/LIDOCAINE: 5.0000 mL | Freq: Three times a day (TID) | ORAL | Status: DC

## 2014-07-17 MED ORDER — PREDNISONE 20 MG PO TABS: ORAL_TABLET | ORAL | Status: DC

## 2014-07-17 MED ORDER — HYDROCODONE-ACETAMINOPHEN 5-325 MG PO TABS: 2.0000 | ORAL_TABLET | ORAL | Status: DC | PRN

## 2014-07-17 NOTE — Discharge Instructions (Signed)
Please read and follow all provided instructions.  Your diagnoses today include:  1. Behcet's disease    Tests performed today include:  Vital signs. See below for your results today.   Medications prescribed:   Prednisone - steroid medicine   It is best to take this medication in the morning to prevent sleeping problems. If you are diabetic, monitor your blood sugar closely and stop taking Prednisone if blood sugar is over 300. Take with food to prevent stomach upset.    Vicodin (hydrocodone/acetaminophen) - narcotic pain medication  DO NOT drive or perform any activities that require you to be awake and alert because this medicine can make you drowsy. BE VERY CAREFUL not to take multiple medicines containing Tylenol (also called acetaminophen). Doing so can lead to an overdose which can damage your liver and cause liver failure and possibly death.   Oral lidocaine - medication to numb mouth sores.   Take any prescribed medications only as directed.  Home care instructions:  Follow any educational materials contained in this packet.  BE VERY CAREFUL not to take multiple medicines containing Tylenol (also called acetaminophen). Doing so can lead to an overdose which can damage your liver and cause liver failure and possibly death.   Follow-up instructions: Please follow-up with your primary care provider in the next 7 days for further evaluation of your symptoms. Please follow-up to discuss possible options for your frequent flares.   Return instructions:   Please return to the Emergency Department if you experience worsening symptoms.   Please return if you have any other emergent concerns.  Additional Information:  Your vital signs today were: BP 125/86   Pulse 85   Temp(Src) 97.8 F (36.6 C) (Oral)   Resp 16   SpO2 100% If your blood pressure (BP) was elevated above 135/85 this visit, please have this repeated by your doctor within one month. --------------

## 2014-07-17 NOTE — ED Provider Notes (Signed)
CSN: [id number]     Arrival date & time 07/17/14  [id number] History   First MD Initiated Contact with Patient 07/17/14 [id number]     Chief Complaint  Patient presents with  . Rash  . 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 He complains of sores. He denies involvement of disease in other areas of his body (contradicting nursing note). No fevers, nausea, vomiting, or diarrhea. Decreased by mouth intake due to pain. He was taking Vicodin given to him from prior visit which helped. He is usually treated with tapered course of prednisone, pain medication, oral lidocaine. Patient is not currently seeing a primary care physician or other specialists for long-term management of his disease.  Patient is a 23 y.o. male presenting with rash and mouth sores. The history is provided by the patient and medical records.  Rash Associated symptoms: no abdominal pain, no diarrhea, no fever, no headaches, no myalgias, no nausea, no sore throat and not vomiting   Mouth Lesions Associated symptoms: no fever, no rash, no rhinorrhea and no sore throat     Past Medical History  Diagnosis Date  . Behcet's disease   . MRSA (methicillin resistant staph aureus) culture positive   . Herpes    Past Surgical History  Procedure Laterality Date  . Biopsy pharynx     Family History  Problem Relation Age of Onset  . Heart disease Mother   . Lupus Neg Hx   . Crohn's disease Neg Hx    History  Substance Use Topics  . Smoking status: Current Every Day Smoker -- 0.25 packs/day    Types: Cigarettes  . Smokeless tobacco: Never Used  . Alcohol Use: Yes     Comment: occ    Review of Systems  Constitutional: Negative for fever.  HENT: Positive  for mouth sores. Negative for rhinorrhea and sore throat.   Eyes: Negative for redness.  Respiratory: Negative for cough.   Cardiovascular: Negative for chest pain.  Gastrointestinal: Negative for nausea, vomiting, abdominal pain and diarrhea.  Genitourinary: Negative for dysuria.  Musculoskeletal: Negative for myalgias.  Skin: Negative for rash.  Neurological: Negative for headaches.    Allergies  Review of patient's allergies indicates no known allergies.  Home Medications   Prior to Admission medications   Medication Sig Start Date End Date Taking? Authorizing Provider  Alum & Mag Hydroxide-Simeth (MAGIC MOUTHWASH W/LIDOCAINE) SOLN Take 10 mLs by mouth 4 (four) times daily as needed for mouth pain. 06/10/14   Gilda Creasehristopher J. Pollina, MD  Alum & Mag Hydroxide-Simeth (MAGIC MOUTHWASH W/LIDOCAINE) SOLN Take 5 mLs by mouth 3 (three) times daily. 07/17/14   Renne CriglerJoshua Cherye Gaertner, PA-C  HYDROcodone-acetaminophen (NORCO/VICODIN) 5-325 MG per tablet Take 2 tablets by mouth every 4 (four) hours as needed for moderate pain. 07/17/14   Renne CriglerJoshua Shenita Trego, PA-C  naproxen (NAPROSYN) 375 MG tablet Take 1 tablet (375 mg total) by mouth 2 (two) times daily. 05/25/14   April K Palumbo-Rasch, MD  nystatin (MYCOSTATIN) 100000 UNIT/ML suspension Take 5 mLs (500,000 Units total) by mouth 4 (four) times daily. 05/25/14   April K Palumbo-Rasch, MD  predniSONE (DELTASONE) 20 MG tablet 3 tabs po daily x 3 days, then 2 tabs x 3 days, then 1.5 tabs x 3 days, then 1 tab x 3 days, then 0.5 tabs x 3  days 07/17/14   Renne CriglerJoshua Kenyata Guess, PA-C   BP 125/86  Pulse 85  Temp(Src) 97.8 F (36.6 C) (Oral)  Resp 16  SpO2 100%  Physical Exam  Nursing note and vitals reviewed. Constitutional: He appears well-developed and well-nourished.  HENT:  Head: Normocephalic and atraumatic.  Several small oral ulcerations, inflamed buccal mucosa.   Eyes: Conjunctivae are normal. Right eye exhibits no discharge. Left eye exhibits no discharge.  Neck:  Normal range of motion. Neck supple.  Cardiovascular: Normal rate, regular rhythm and normal heart sounds.   Pulmonary/Chest: Effort normal and breath sounds normal.  Abdominal: Soft. There is no tenderness.  Neurological: He is alert.  Skin: Skin is warm and dry. No rash noted.  Psychiatric: He has a normal mood and affect.    ED Course  Procedures (including critical care time) Labs Review Labs Reviewed - No data to display  Imaging Review No results found.   EKG Interpretation None      8:21 AM Patient seen and examined.   Vital signs reviewed and are as follows: BP 125/86  Pulse 85  Temp(Src) 97.8 F (36.6 C) (Oral)  Resp 16  SpO2 100%   Counseled patient on need for PCP to look for better options than recurrent prednisone for control of his disease. We discussed some long term effects of prednisone.   Patient urged to return with worsening symptoms or other concerns. Patient verbalized understanding and agrees with plan.   Patient counseled on use of narcotic pain medications. Counseled not to combine these medications with others containing tylenol. Urged not to drink alcohol, drive, or perform any other activities that requires focus while taking these medications. The patient verbalizes understanding and agrees with the plan.   MDM   Final diagnoses:  Behcet's disease   Pt with uncomplicated flare of Behcet's disease. He is able to eat and drink and is not clinically dehydrated. Meds given to control symptoms -- no indications for admission.     Renne CriglerJoshua Diane Hanel, PA-C 07/17/14 [phone]

## 2014-07-17 NOTE — ED Notes (Signed)
Pt sts that she was seen here and dx with Behcet's and is now having a flare of sores to his body and with mouth lesions. Pt has no other c/o at this time. Pt is A&O and in NAD

## 2014-07-24 NOTE — ED Provider Notes (Signed)
Medical screening examination/treatment/procedure(s) were performed by non-physician practitioner and as supervising physician I was immediately available for consultation/collaboration.   EKG Interpretation None        Rolland PorterMark Gradie Ohm, MD 07/24/14 [phone]

## 2014-08-03 ENCOUNTER — Emergency Department (HOSPITAL_BASED_OUTPATIENT_CLINIC_OR_DEPARTMENT_OTHER)
Admission: EM | Admit: 2014-08-03 | Discharge: 2014-08-04 | Disposition: A | Discharge status: Home / Self Care | Payer: PRIVATE HEALTH INSURANCE | Attending: Emergency Medicine | Admitting: Emergency Medicine

## 2014-08-03 ENCOUNTER — Encounter (HOSPITAL_BASED_OUTPATIENT_CLINIC_OR_DEPARTMENT_OTHER): Payer: Self-pay | Admitting: Emergency Medicine

## 2014-08-03 DIAGNOSIS — Z7952 Long term (current) use of systemic steroids: Secondary | ICD-10-CM | POA: Insufficient documentation

## 2014-08-03 DIAGNOSIS — Z8614 Personal history of Methicillin resistant Staphylococcus aureus infection: Secondary | ICD-10-CM | POA: Insufficient documentation

## 2014-08-03 DIAGNOSIS — Z8619 Personal history of other infectious and parasitic diseases: Secondary | ICD-10-CM | POA: Insufficient documentation

## 2014-08-03 DIAGNOSIS — Z8739 Personal history of other diseases of the musculoskeletal system and connective tissue: Secondary | ICD-10-CM | POA: Diagnosis not present

## 2014-08-03 DIAGNOSIS — Z791 Long term (current) use of non-steroidal anti-inflammatories (NSAID): Secondary | ICD-10-CM | POA: Insufficient documentation

## 2014-08-03 DIAGNOSIS — R300 Dysuria: Secondary | ICD-10-CM | POA: Diagnosis not present

## 2014-08-03 DIAGNOSIS — Z711 Person with feared health complaint in whom no diagnosis is made: Secondary | ICD-10-CM

## 2014-08-03 DIAGNOSIS — Z202 Contact with and (suspected) exposure to infections with a predominantly sexual mode of transmission: Secondary | ICD-10-CM | POA: Diagnosis not present

## 2014-08-03 DIAGNOSIS — Z720 Tobacco use: Secondary | ICD-10-CM | POA: Diagnosis not present

## 2014-08-03 DIAGNOSIS — R369 Urethral discharge, unspecified: Secondary | ICD-10-CM | POA: Diagnosis present

## 2014-08-03 DIAGNOSIS — Z79899 Other long term (current) drug therapy: Secondary | ICD-10-CM | POA: Diagnosis not present

## 2014-08-03 LAB — URINALYSIS, ROUTINE W REFLEX MICROSCOPIC
Glucose, UA: NEGATIVE mg/dL
Hgb urine dipstick: NEGATIVE
KETONES UR: 15 mg/dL — AB
NITRITE: NEGATIVE
Protein, ur: NEGATIVE mg/dL
Specific Gravity, Urine: 1.042 — ABNORMAL HIGH (ref 1.005–1.030)
Urobilinogen, UA: 1 mg/dL (ref 0.0–1.0)
pH: 5.5 (ref 5.0–8.0)

## 2014-08-03 LAB — URINE MICROSCOPIC-ADD ON

## 2014-08-03 NOTE — ED Notes (Signed)
Pt reports discharge from penis and burning with urination that started 4 days ago.  Denies new sexual partner but is having unprotected sex.

## 2014-08-04 LAB — RPR

## 2014-08-04 LAB — HIV ANTIBODY (ROUTINE TESTING W REFLEX): HIV: NONREACTIVE

## 2014-08-04 MED ORDER — CEFTRIAXONE SODIUM 250 MG IJ SOLR
250.0000 mg | Freq: Once | INTRAMUSCULAR | Status: AC
  Administered 2014-08-04: 250 mg via INTRAMUSCULAR
  Filled 2014-08-04: qty 250

## 2014-08-04 MED ORDER — AZITHROMYCIN 250 MG PO TABS
1000.0000 mg | ORAL_TABLET | Freq: Once | ORAL | Status: AC
  Administered 2014-08-04: 1000 mg via ORAL
  Filled 2014-08-04: qty 4

## 2014-08-04 MED ORDER — LIDOCAINE HCL (PF) 1 % IJ SOLN
INTRAMUSCULAR | Status: AC
  Administered 2014-08-04: 1.5 mL
  Filled 2014-08-04: qty 5

## 2014-08-04 NOTE — Discharge Instructions (Signed)
Sexually Transmitted Disease °A sexually transmitted disease (STD) is a disease or infection that may be passed (transmitted) from person to person, usually during sexual activity. This may happen by way of saliva, semen, blood, vaginal mucus, or urine. Common STDs include:  °· Gonorrhea.   °· Chlamydia.   °· Syphilis.   °· HIV and AIDS.   °· Genital herpes.   °· Hepatitis B and C.   °· Trichomonas.   °· Human papillomavirus (HPV).   °· Pubic lice.   °· Scabies. °· Mites. °· Bacterial vaginosis. °WHAT ARE CAUSES OF STDs? °An STD may be caused by bacteria, a virus, or parasites. STDs are often transmitted during sexual activity if one person is infected. However, they may also be transmitted through nonsexual means. STDs may be transmitted after:  °· Sexual intercourse with an infected person.   °· Sharing sex toys with an infected person.   °· Sharing needles with an infected person or using unclean piercing or tattoo needles. °· Having intimate contact with the genitals, mouth, or rectal areas of an infected person.   °· Exposure to infected fluids during birth. °WHAT ARE THE SIGNS AND SYMPTOMS OF STDs? °Different STDs have different symptoms. Some people may not have any symptoms. If symptoms are present, they may include:  °· Painful or bloody urination.   °· Pain in the pelvis, abdomen, vagina, anus, throat, or eyes.   °· A skin rash, itching, or irritation. °· Growths, ulcerations, blisters, or sores in the genital and anal areas. °· Abnormal vaginal discharge with or without bad odor.   °· Penile discharge in men.   °· Fever.   °· Pain or bleeding during sexual intercourse.   °· Swollen glands in the groin area.   °· Yellow skin and eyes (jaundice). This is seen with hepatitis.   °· Swollen testicles. °· Infertility. °· Sores and blisters in the mouth. °HOW ARE STDs DIAGNOSED? °To make a diagnosis, your health care provider may:  °· Take a medical history.   °· Perform a physical exam.   °· Take a sample of  any discharge to examine. °· Swab the throat, cervix, opening to the penis, rectum, or vagina for testing. °· Test a sample of your first morning urine.   °· Perform blood tests.   °· Perform a Pap test, if this applies.   °· Perform a colposcopy.   °· Perform a laparoscopy.   °HOW ARE STDs TREATED? ° Treatment depends on the STD. Some STDs may be treated but not cured.  °· Chlamydia, gonorrhea, trichomonas, and syphilis can be cured with antibiotic medicine.   °· Genital herpes, hepatitis, and HIV can be treated, but not cured, with prescribed medicines. The medicines lessen symptoms.   °· Genital warts from HPV can be treated with medicine or by freezing, burning (electrocautery), or surgery. Warts may come back.   °· HPV cannot be cured with medicine or surgery. However, abnormal areas may be removed from the cervix, vagina, or vulva.   °· If your diagnosis is confirmed, your recent sexual partners need treatment. This is true even if they are symptom-free or have a negative culture or evaluation. They should not have sex until their health care providers say it is okay. °HOW CAN I REDUCE MY RISK OF GETTING AN STD? °Take these steps to reduce your risk of getting an STD: °· Use latex condoms, dental dams, and water-soluble lubricants during sexual activity. Do not use petroleum jelly or oils. °· Avoid having multiple sex partners. °· Do not have sex with someone who has other sex partners. °· Do not have sex with anyone you do not know or who is at   high risk for an STD. °· Avoid risky sex practices that can break your skin. °· Do not have sex if you have open sores on your mouth or skin. °· Avoid drinking too much alcohol or taking illegal drugs. Alcohol and drugs can affect your judgment and put you in a vulnerable position. °· Avoid engaging in oral and anal sex acts. °· Get vaccinated for HPV and hepatitis. If you have not received these vaccines in the past, talk to your health care provider about whether one  or both might be right for you.   °· If you are at risk of being infected with HIV, it is recommended that you take a prescription medicine daily to prevent HIV infection. This is called pre-exposure prophylaxis (PrEP). You are considered at risk if: °¨ You are a man who has sex with other men (MSM). °¨ You are a heterosexual man or woman and are sexually active with more than one partner. °¨ You take drugs by injection. °¨ You are sexually active with a partner who has HIV. °· Talk with your health care provider about whether you are at high risk of being infected with HIV. If you choose to begin PrEP, you should first be tested for HIV. You should then be tested every 3 months for as long as you are taking PrEP.   °WHAT SHOULD I DO IF I THINK I HAVE AN STD? °· See your health care provider.   °· Tell your sexual partner(s). They should be tested and treated for any STDs. °· Do not have sex until your health care provider says it is okay.  °WHEN SHOULD I GET IMMEDIATE MEDICAL CARE? °Contact your health care provider right away if:  °· You have severe abdominal pain. °· You are a man and notice swelling or pain in your testicles. °· You are a woman and notice swelling or pain in your vagina. °Document Released: 12/16/2002 Document Revised: 09/30/2013 Document Reviewed: 04/15/2013 °ExitCare® Patient Information ©2015 [employer]. This information is not intended to replace advice given to you by your health care provider. Make sure you discuss any questions you have with your health care provider. ° ° °Emergency Department Resource Guide °1) Find a Doctor and Pay Out of Pocket °Although you won't have to find out who is covered by your insurance plan, it is a good idea to ask around and get recommendations. You will then need to call the office and see if the doctor you have chosen will accept you as a new patient and what types of options they offer for patients who are self-pay. Some doctors offer discounts or  will set up payment plans for their patients who do not have insurance, but you will need to ask so you aren't surprised when you get to your appointment. ° °2) Contact Your Local Health Department °Not all health departments have doctors that can see patients for sick visits, but many do, so it is worth a call to see if yours does. If you don't know where your local health department is, you can check in your phone book. The CDC also has a tool to help you locate your state's health department, and many state websites also have listings of all of their local health departments. ° °3) Find a Walk-in Clinic °If your illness is not likely to be very severe or complicated, you may want to try a walk in clinic. These are popping up all over the country in pharmacies, drugstores, and shopping centers. They're usually   staffed by nurse practitioners or physician assistants that have been trained to treat common illnesses and complaints. They're usually fairly quick and inexpensive. However, if you have serious medical issues or chronic medical problems, these are probably not your best option. ° °No Primary Care Doctor: °- Call [facility] at  832-8000 - they can help you locate a primary care doctor that  accepts your insurance, provides certain services, etc. °- Physician Referral Service- 1-800-533-3463 ° °Chronic Pain Problems: °Organization         Address  Phone   Notes  °[facility]  (336) 297-2271 Patients need to be referred by their primary care doctor.  ° °Medication Assistance: °Organization         Address  Phone   Notes  °[place] [facility] [address] °[place], [place] [address] (336) 641-8030 --Must be a resident of [place] °-- Must have NO insurance coverage whatsoever (no Medicaid/ Medicare, etc.) °-- The pt. MUST have a primary care doctor that directs their care regularly and follows them in the community °  °[facility]  (866)  331-1348   °[employer]  (888) 892-1162   ° °Agencies that provide inexpensive medical care: °Organization         Address  Phone   Notes  °[facility]  (336) 832-8035   °[facility]    (336) 832-7272   °[facility] [address] °[place], [place] [address] (336) 832-4777   °[facility] [address], °[place] (336) 271-4999   °[facility]    (336) 373-0678   °[facility]    (336) 272-1050   °[facility] ° [address], [place] Phone:  (336) 832-4444, Fax:  (336) 832-4440 Hours of Operation:  9 am - 6 pm, M-F.  Also accepts Medicaid/Medicare and self-pay.  °[facility] ° [address], [place] Phone: (336) 832-3150, Fax: (336) 832-3151. Hours of Operation:  8:30 am - 5:30 pm, M-F.  Also accepts Medicaid and self-pay.  °[facility] [place] [address], [place] Phone: (336) 878-6027   °[facility] [address], [place], [place] (336)723-1848, Ext. 123 Mondays & Thursdays: 7-9 AM.  First 15 patients are seen on a first come, first serve basis. °  ° °Medicaid-accepting [place] Providers: ° °Organization         Address  Phone   Notes  °[facility] [address], [place] (336) 641-2100 Also accepts self-pay patients.  °[facility] [address], [place] ° (336) 856-9996   °[facility] [address], [place] (336) 288-8857   °[facility] [address] [place] [address], [place] (336) 299-7000   °Veita Bland [address], [place]  ° (336) 373-1557 Only accepts [place] Access Medicaid patients after they have their name applied to their card.  ° °Self-Pay (no insurance) in [place]: ° °Organization         Address  Phone   Notes  °Sickle Cell Patients, [facility] [address], [place] (336) 832-1970   °[facility] [address],  (336) 832-4400   °[facility] ° [address], [place] (336) 992-4800   °[facility]/Dr. Osei-Bonsu ° [address] [place] [address], [place] or [address], [place] (336) 841-8500 Phone number for both [place] and [place] locations is the same.  °[facility] [address], [place] (336) 299-0000   °[facility] [address] [place] [address], [place] or [address] (336) 852-7530 °(336) 878-2260   °[facility] [address], [place] (336) 350-1642, phone; (336) 294-5005, fax Sees patients 1st and 3rd Saturday of every month.  Must not qualify for public or private insurance (i.e. Medicaid, Medicare, [place] Health Choice, Veterans' Benefits) • Household income should be no more than 200% of the poverty level •The clinic cannot treat you if you are pregnant or think you are pregnant • Sexually transmitted diseases are not treated at the clinic.  ° ° °Dental Care: °Organization         Address  Phone  Notes  °[facility] [address], [place] (336) 641-6152 Accepts children up to age 21 who are enrolled in Medicaid or [place] Health Choice; pregnant women with a Medicaid card; and children who have applied for Medicaid or [place] Health Choice, but were declined, whose parents can pay a reduced fee at time of service.  °[facility] [place]  [address], [place] (336) 641-7733 Accepts children up to age 21 who are enrolled in Medicaid or [place] Health Choice; pregnant women with a Medicaid card; and children who have applied for Medicaid or [place] Health Choice, but were declined, whose parents can pay a reduced fee at time of service.  °[facility] ° [address], [place] (336)  641-4533 Patients are seen by appointment only. Walk-ins are not accepted. [facility] will see patients 18 years of age and older. °Monday - Tuesday (8am-5pm) °Most Wednesdays (8:30-5pm) °$30 per visit, cash only  °[facility] ° [address], [place] (336) 641-4533 Patients are seen by appointment only. Walk-ins are not accepted. [facility] will see patients 18 years of age and older. °One Wednesday Evening (Monthly: Volunteer Based).  $30 per visit, cash only  °[facility]  (919) 537-3737 for adults; Children under age 4, call Graduate Pediatric Dentistry at (919) [phone]. Children aged 4-14, please call (919) [phone] to request a pediatric application. ° Dental services are provided in all areas of dental care including fillings, crowns and bridges, complete and partial dentures, implants, gum treatment, root canals, and extractions. Preventive care is also provided. Treatment is provided to both adults and children. °Patients are selected via a lottery and there is often a waiting list. °  °[facility] [address], °[place] ° (336) 763-8833 [email or web address] °  °[facility] [address], [place], [place] (336)723-1848, Ext. 123 Second and Fourth Thursday of each month, opens at 6:30 AM; Clinic ends at 9 AM.  Patients are seen on a first-come first-served basis, and a limited number are seen during each clinic.  ° °[facility] ° [address], [place], [place] (336) 723-7904   Eligibility Requirements °You must have lived in [place], [place], or [place] counties for at least the last three months. °  You cannot be eligible for state or federal sponsored healthcare insurance, including Veterans Administration, Medicaid, or Medicare. °  You generally cannot be eligible for healthcare insurance through your employer.  °    How to apply: °Eligibility screenings are held every Tuesday and Wednesday afternoon  from 1:00 pm until 4:00 pm. You do not need an appointment for the interview!  °[facility] [address], [place], [place] 336-631-2330   °[facility]  336-342-8273   °[facility]  336-703-3100   °[facility]  336-570-6415   ° °Behavioral Health Resources in the Community: °Intensive Outpatient Programs °Organization         Address  Phone  Notes  °[facility] [address], [place], [place] 336-878-6098   °[facility] [address], [place], [place] 336-832-9800   °[facility] [address], [place], [place] ° 336-882-2125   °[facility] [address],  °[place], [place] 1-800-853-5163 or 336-641-4981   °Substance Abuse Resources °Organization         Address  Phone  Notes  °[facility]  336-882-2125   °[facility]  336-784-9470   °[facility]  336-285-9073   °[facility]  336-845-3988   °Residential & Outpatient Substance Abuse Program  1-800-659-3381   °Psychological Services °Organization         Address  Phone  Notes  °[facility]  336- 832-9600   °[facility]  336- 378-7881   °[facility] [address], [place] 1-800-853-5163 or 336-641-4981   ° °Mobile Crisis Teams °Organization         Address  Phone  Notes  °[facility], Mobile Crisis Care Unit  1-877-626-1772   °Assertive °[facility] ° [address] [place], [place] 336-834-9664   °Sharon DeEsch [address] °[place] 336-554-5454   ° °Self-Help/Support Groups °Organization         Address  Phone             Notes  °[facility] - variety of support groups  336- 373-1402 Call for more information  °Narcotics Anonymous (NA), [facility] [address], °[place]  2 meetings at this location  ° °Residential Treatment  Programs °Organization         Address  Phone  Notes  °[facility] [address],    °[place]  1-866-801-8205   °[facility] ° [address], [place], [place] 704-293-8524   °[facility] [address], [place] 336-845-3988 Admissions: 8am-3pm M-F  °[facility] [address]    °[place], [place] 336-841-1104   °[facility] [address], [place], [place] 336-379-7146   °[facility] [address]  °[place], [place] 336-285-9073   °[facility] Intensive Outpatient [address], [place], [place] 336-852-3033   °[facility] [address]  °[place], [place] 1-877-615-2722 or 336-784-9470   °[facility] [address] [place], [place] 336-227-7417 Accepts Medicaid  °[facility] [address]  °[place] 1-800-659-3381 Substance Abuse/Addiction Treatment  ° °[place] Behavioral Health Resources °Organization         Address  Phone  Notes  °[facility]  (888) 581-9988   °Julie Brannon, PhD [address] [place], [place]   (336) 349-5553 or (336) 951-0000   °[facility]   [address] °[place], [place] (336) 349-4454   °[facility] [address], [place], [place] (336) 342-8316 Insurance/Medicaid/sponsorship through [facility]  °[facility] [address]                                    [place], [place] (336) 342-8316 Therapy/tele-psych/case  °[facility] [address]  ° [place], [place] (336) 349-2233    °Dr. Arfeen  (336) 349-4544   °[facility] [place]  [employer] [place] [facility] 1) [address], [place] °2) [address], [place] °3)  [address], [place] (336) 349-3220 °(336) 342-7768 ° °(336) 342-8140   °[place] [facility] (336) 342-1394 or (336) 342-3537 (After Hours)    ° ° ° °

## 2014-08-04 NOTE — ED Provider Notes (Signed)
Medical screening examination/treatment/procedure(s) were performed by non-physician practitioner and as supervising physician I was immediately available for consultation/collaboration.     Suzi RootsKevin E Vannak Montenegro, MD 08/04/14 [phone]

## 2014-08-04 NOTE — ED Provider Notes (Signed)
CSN: [id number]     Arrival date & time 08/03/14  2025 History   First MD Initiated Contact with Patient 08/03/14 2317     Chief Complaint  Patient presents with  . Penile Discharge   Paul House is a 23 y.o. male with a history of chlamydia and herpes presents to the ED complaining of milky white penile discharge and dysuria for 4 days. He reports having unprotected sex but denies new sex partners. Reports only having intercourse with his wife who is currently asymptomatic. He reports that his symptoms seem similar to the last time he was diagnosed with chlamydia which was about 2 years ago. He denies fevers, chills, abdominal pain, nausea, vomiting, diarrhea, area, rashes, penile sores or lesions.  (Consider location/radiation/quality/duration/timing/severity/associated sxs/prior Treatment) Patient is a 23 y.o. male presenting with penile discharge. The history is provided by the patient.  Penile Discharge This is a new problem. The current episode started in the past 7 days. The problem occurs constantly. The problem has been unchanged. Associated symptoms include urinary symptoms. Pertinent negatives include no abdominal pain, anorexia, arthralgias, change in bowel habit, chest pain, chills, congestion, coughing, diaphoresis, fatigue, fever, headaches, myalgias, nausea, numbness, rash, sore throat, swollen glands, visual change, vomiting or weakness. Nothing aggravates the symptoms. He has tried nothing for the symptoms.    Past Medical History  Diagnosis Date  . Behcet's disease   . MRSA (methicillin resistant staph aureus) culture positive   . Herpes    Past Surgical History  Procedure Laterality Date  . Biopsy pharynx     Family History  Problem Relation Age of Onset  . Heart disease Mother   . Lupus Neg Hx   . Crohn's disease Neg Hx    History  Substance Use Topics  . Smoking status: Current Every Day Smoker -- 0.25 packs/day    Types: Cigarettes  . Smokeless  tobacco: Never Used  . Alcohol Use: Yes     Comment: occ    Review of Systems  Constitutional: Negative for fever, chills, diaphoresis and fatigue.  HENT: Negative for congestion and sore throat.   Respiratory: Negative for cough.   Cardiovascular: Negative for chest pain.  Gastrointestinal: Negative for nausea, vomiting, abdominal pain, diarrhea, blood in stool, rectal pain, anorexia and change in bowel habit.  Genitourinary: Positive for dysuria and discharge. Negative for urgency, frequency, hematuria, flank pain, decreased urine volume, penile swelling, scrotal swelling, genital sores and testicular pain.  Musculoskeletal: Negative for arthralgias and myalgias.  Skin: Negative for rash.  Neurological: Negative for dizziness, weakness, numbness and headaches.  All other systems reviewed and are negative.     Allergies  Review of patient's allergies indicates no known allergies.  Home Medications   Prior to Admission medications   Medication Sig Start Date End Date Taking? Authorizing Provider  Alum & Mag Hydroxide-Simeth (MAGIC MOUTHWASH W/LIDOCAINE) SOLN Take 10 mLs by mouth 4 (four) times daily as needed for mouth pain. 06/10/14   Gilda Creasehristopher J. Pollina, MD  Alum & Mag Hydroxide-Simeth (MAGIC MOUTHWASH W/LIDOCAINE) SOLN Take 5 mLs by mouth 3 (three) times daily. 07/17/14   Renne CriglerJoshua Geiple, PA-C  HYDROcodone-acetaminophen (NORCO/VICODIN) 5-325 MG per tablet Take 2 tablets by mouth every 4 (four) hours as needed for moderate pain. 07/17/14   Renne CriglerJoshua Geiple, PA-C  naproxen (NAPROSYN) 375 MG tablet Take 1 tablet (375 mg total) by mouth 2 (two) times daily. 05/25/14   April K Palumbo-Rasch, MD  nystatin (MYCOSTATIN) 100000 UNIT/ML suspension Take 5 mLs (500,000 Units  total) by mouth 4 (four) times daily. 05/25/14   April K Palumbo-Rasch, MD  predniSONE (DELTASONE) 20 MG tablet 3 tabs po daily x 3 days, then 2 tabs x 3 days, then 1.5 tabs x 3 days, then 1 tab x 3 days, then 0.5 tabs x 3 days  07/17/14   Renne Crigler, PA-C   BP 117/78  Pulse 82  Temp(Src) 97.8 F (36.6 C) (Oral)  Resp 16  Ht 6' (1.829 m)  Wt 155 lb (70.308 kg)  BMI 21.02 kg/m2  SpO2 98% Physical Exam  Nursing note and vitals reviewed. Constitutional: He appears well-developed and well-nourished. No distress.  HENT:  Head: Normocephalic and atraumatic.  Mouth/Throat: Oropharynx is clear and moist.  Eyes: Conjunctivae are normal. Pupils are equal, round, and reactive to light. Right eye exhibits no discharge. Left eye exhibits no discharge. No scleral icterus.  Neck: Neck supple.  Cardiovascular: Normal rate, regular rhythm, normal heart sounds and intact distal pulses.  Exam reveals no gallop and no friction rub.   No murmur heard. Pulmonary/Chest: Effort normal and breath sounds normal. No respiratory distress. He has no wheezes. He has no rales.  Abdominal: Soft. Bowel sounds are normal. He exhibits no distension and no mass. There is no tenderness. There is no rebound and no guarding.  Genitourinary: No penile tenderness.  Penis is nontender to palpation with milky white discharge from the urethra. No rashes, lesions or erythema noted. GU exam performed with male chaperone present.  Musculoskeletal: He exhibits no edema.  Lymphadenopathy:    He has no cervical adenopathy.  Neurological: He is alert. Coordination normal.  Skin: Skin is warm and dry. No rash noted. He is not diaphoretic. No erythema. No pallor.  Psychiatric: He has a normal mood and affect. His behavior is normal.    ED Course  Procedures (including critical care time) Labs Review Labs Reviewed  URINALYSIS, ROUTINE W REFLEX MICROSCOPIC - Abnormal; Notable for the following:    Color, Urine AMBER (*)    APPearance CLOUDY (*)    Specific Gravity, Urine 1.042 (*)    Bilirubin Urine SMALL (*)    Ketones, ur 15 (*)    Leukocytes, UA MODERATE (*)    All other components within normal limits  URINE MICROSCOPIC-ADD ON - Abnormal;  Notable for the following:    Squamous Epithelial / LPF FEW (*)    Bacteria, UA FEW (*)    All other components within normal limits  GC/CHLAMYDIA PROBE AMP  RPR  HIV ANTIBODY (ROUTINE TESTING)    Imaging Review No results found.   EKG Interpretation None      Filed Vitals:   08/03/14 2032 08/03/14 2359 08/04/14 0041  BP: 116/78 124/81 117/78  Pulse: 102 72 82  Temp: 97.8 F (36.6 C) 98.9 F (37.2 C) 97.8 F (36.6 C)  TempSrc: Oral Oral Oral  Resp: 18 18 16   Height: 6' (1.829 m)    Weight: 155 lb (70.308 kg)    SpO2: 100% 100% 98%     MDM   Meds given in ED:  Medications  cefTRIAXone (ROCEPHIN) injection 250 mg (250 mg Intramuscular Given 08/04/14 0023)  azithromycin (ZITHROMAX) tablet 1,000 mg (1,000 mg Oral Given 08/04/14 0023)  lidocaine (PF) (XYLOCAINE) 1 % injection (1.5 mLs  Given 08/04/14 0023)    Discharge Medication List as of 08/04/2014 12:22 AM      Final diagnoses:  Concern about STD in male without diagnosis   Pope Brunty percent CD with 4 days  of white penile discharge and dysuria. Has a history of previous chlamydia infection. His urinalysis shows numerous bacteria and mucus present. He has milky white penile discharge without rash or lesions. STI panel is in progress. Advised patient that it can take up to 3 days for results to return. We'll go ahead and treat patient for chlamydia and gonorrhea with azithromycin 1 g by mouth and 200 mg Rocephin IM. Advised patient to follow-up with his primary care provider in 3 days. Advised to have his partner also be treated for chlamydia and gonorrhea prior to any further sexual activity. Advised to return to the ED with new or worsening symptoms or new concerns. Patient verbalized understanding and agreement with plan. [facility], PA-C.     Lawana ChambersWilliam Duncan Leeba Barbe, [place] 08/04/14 [phone]

## 2014-08-06 LAB — GC/CHLAMYDIA PROBE AMP: CT Probe RNA: UNDETERMINED

## 2014-08-20 ENCOUNTER — Encounter (HOSPITAL_COMMUNITY): Payer: Self-pay | Admitting: Emergency Medicine

## 2014-08-20 ENCOUNTER — Emergency Department (HOSPITAL_COMMUNITY)
Admission: EM | Admit: 2014-08-20 | Discharge: 2014-08-20 | Disposition: A | Discharge status: Home / Self Care | Payer: PRIVATE HEALTH INSURANCE | Attending: Emergency Medicine | Admitting: Emergency Medicine

## 2014-08-20 DIAGNOSIS — Z720 Tobacco use: Secondary | ICD-10-CM | POA: Diagnosis not present

## 2014-08-20 DIAGNOSIS — K088 Other specified disorders of teeth and supporting structures: Secondary | ICD-10-CM | POA: Diagnosis present

## 2014-08-20 DIAGNOSIS — K121 Other forms of stomatitis: Secondary | ICD-10-CM | POA: Diagnosis not present

## 2014-08-20 DIAGNOSIS — Z791 Long term (current) use of non-steroidal anti-inflammatories (NSAID): Secondary | ICD-10-CM | POA: Diagnosis not present

## 2014-08-20 DIAGNOSIS — Z79899 Other long term (current) drug therapy: Secondary | ICD-10-CM | POA: Diagnosis not present

## 2014-08-20 DIAGNOSIS — Z8614 Personal history of Methicillin resistant Staphylococcus aureus infection: Secondary | ICD-10-CM | POA: Insufficient documentation

## 2014-08-20 DIAGNOSIS — Z8619 Personal history of other infectious and parasitic diseases: Secondary | ICD-10-CM | POA: Diagnosis not present

## 2014-08-20 DIAGNOSIS — Z8739 Personal history of other diseases of the musculoskeletal system and connective tissue: Secondary | ICD-10-CM | POA: Diagnosis not present

## 2014-08-20 DIAGNOSIS — Z7952 Long term (current) use of systemic steroids: Secondary | ICD-10-CM | POA: Insufficient documentation

## 2014-08-20 DIAGNOSIS — K1230 Oral mucositis (ulcerative), unspecified: Secondary | ICD-10-CM | POA: Insufficient documentation

## 2014-08-20 MED ORDER — HYDROCODONE-ACETAMINOPHEN 5-325 MG PO TABS: 1.0000 | ORAL_TABLET | Freq: Four times a day (QID) | ORAL | Status: DC | PRN

## 2014-08-20 MED ORDER — MAGIC MOUTHWASH W/LIDOCAINE: 5.0000 mL | Freq: Four times a day (QID) | ORAL | Status: DC

## 2014-08-20 MED ORDER — PREDNISONE 50 MG PO TABS: 50.0000 mg | ORAL_TABLET | Freq: Every day | ORAL | Status: DC

## 2014-08-20 NOTE — Discharge Instructions (Signed)
Return here as needed. Follow up with your doctor. °

## 2014-08-20 NOTE — ED Provider Notes (Signed)
CSN: [id number]     Arrival date & time 08/20/14  [id number] History   First MD Initiated Contact with Patient 08/20/14 [phone]     Chief Complaint  Patient presents with  . Dental Pain     (Consider location/radiation/quality/duration/timing/severity/associated sxs/prior Treatment) HPI Patient presents to the emergency department with White plaques noted on his tongue.  The patient states that he has had this before and was treated with steroids and a mouthwash.  Patient denies nausea, vomiting, neck pain, shortness of breath, chest pain, fever, cough, runny nose, sore throat, fatigue or syncope.  The patient states that he did not take any medications prior to arrival.  The patient states that the tongue is painful.patient states nothing seems to make his condition, better or worse Past Medical History  Diagnosis Date  . Behcet's disease   . MRSA (methicillin resistant staph aureus) culture positive   . Herpes    Past Surgical History  Procedure Laterality Date  . Biopsy pharynx     Family History  Problem Relation Age of Onset  . Heart disease Mother   . Lupus Neg Hx   . Crohn's disease Neg Hx    History  Substance Use Topics  . Smoking status: Current Every Day Smoker -- 0.25 packs/day    Types: Cigarettes  . Smokeless tobacco: Never Used  . Alcohol Use: Yes     Comment: occ    Review of Systems All other systems negative except as documented in the HPI. All pertinent positives and negatives as reviewed in the HPI.    Allergies  Review of patient's allergies indicates no known allergies.  Home Medications   Prior to Admission medications   Medication Sig Start Date End Date Taking? Authorizing Provider  predniSONE (DELTASONE) 20 MG tablet 3 tabs po daily x 3 days, then 2 tabs x 3 days, then 1.5 tabs x 3 days, then 1 tab x 3 days, then 0.5 tabs x 3 days 07/17/14  Yes Renne CriglerJoshua Geiple, PA-C  Alum & Mag Hydroxide-Simeth (MAGIC MOUTHWASH W/LIDOCAINE) SOLN Take 10 mLs by mouth 4  (four) times daily as needed for mouth pain. 06/10/14   Gilda Creasehristopher J. Pollina, MD  Alum & Mag Hydroxide-Simeth (MAGIC MOUTHWASH W/LIDOCAINE) SOLN Take 5 mLs by mouth 3 (three) times daily. 07/17/14   Renne CriglerJoshua Geiple, PA-C  HYDROcodone-acetaminophen (NORCO/VICODIN) 5-325 MG per tablet Take 2 tablets by mouth every 4 (four) hours as needed for moderate pain. 07/17/14   Renne CriglerJoshua Geiple, PA-C  naproxen (NAPROSYN) 375 MG tablet Take 1 tablet (375 mg total) by mouth 2 (two) times daily. 05/25/14   April K Palumbo-Rasch, MD  nystatin (MYCOSTATIN) 100000 UNIT/ML suspension Take 5 mLs (500,000 Units total) by mouth 4 (four) times daily. 05/25/14   April K Palumbo-Rasch, MD   BP 142/86 mmHg  Pulse 86  Temp(Src) 97.8 F (36.6 C) (Oral)  Resp 14  SpO2 97% Physical Exam  Constitutional: He is oriented to person, place, and time. He appears well-developed and well-nourished. No distress.  HENT:  Head: Normocephalic and atraumatic.  Mouth/Throat: Oropharynx is clear and moist.    Eyes: Pupils are equal, round, and reactive to light.  Neck: Normal range of motion. Neck supple.  Cardiovascular: Normal rate, regular rhythm and normal heart sounds.  Exam reveals no gallop and no friction rub.   No murmur heard. Pulmonary/Chest: Effort normal and breath sounds normal.  Neurological: He is alert and oriented to person, place, and time.  Skin: Skin is warm and dry.  Nursing note and vitals reviewed.   ED Course  Procedures (including critical care time) Labs Review Labs Reviewed - No data to display  Patient be treated with medications as previously been administered for this and I have advised him that he will need follow-up to figure out what is causing these areas on his tongue  MDM   Final diagnoses:  None        Carlyle DollyChristopher W Rosaleigh Brazzel, PA-C 08/21/14 0615  Elwin MochaBlair Walden, MD 08/21/14 [phone]

## 2014-08-20 NOTE — ED Notes (Signed)
Bed: WA22 Expected date:  Expected time:  Means of arrival:  Comments: 

## 2014-08-20 NOTE — ED Notes (Addendum)
Pt c/o dental pain x 4 days.  Pt is watching videos on his phone during triage.

## 2014-09-01 ENCOUNTER — Emergency Department (HOSPITAL_COMMUNITY)
Admission: EM | Admit: 2014-09-01 | Discharge: 2014-09-01 | Disposition: A | Discharge status: Home / Self Care | Payer: PRIVATE HEALTH INSURANCE | Attending: Emergency Medicine | Admitting: Emergency Medicine

## 2014-09-01 ENCOUNTER — Encounter (HOSPITAL_COMMUNITY): Payer: Self-pay

## 2014-09-01 DIAGNOSIS — K1379 Other lesions of oral mucosa: Secondary | ICD-10-CM | POA: Diagnosis present

## 2014-09-01 DIAGNOSIS — Z8614 Personal history of Methicillin resistant Staphylococcus aureus infection: Secondary | ICD-10-CM | POA: Diagnosis not present

## 2014-09-01 DIAGNOSIS — Z79899 Other long term (current) drug therapy: Secondary | ICD-10-CM | POA: Insufficient documentation

## 2014-09-01 DIAGNOSIS — Z720 Tobacco use: Secondary | ICD-10-CM | POA: Diagnosis not present

## 2014-09-01 DIAGNOSIS — K1230 Oral mucositis (ulcerative), unspecified: Secondary | ICD-10-CM | POA: Insufficient documentation

## 2014-09-01 DIAGNOSIS — K121 Other forms of stomatitis: Secondary | ICD-10-CM | POA: Diagnosis not present

## 2014-09-01 DIAGNOSIS — Z7952 Long term (current) use of systemic steroids: Secondary | ICD-10-CM | POA: Insufficient documentation

## 2014-09-01 DIAGNOSIS — G8929 Other chronic pain: Secondary | ICD-10-CM | POA: Insufficient documentation

## 2014-09-01 DIAGNOSIS — Z8619 Personal history of other infectious and parasitic diseases: Secondary | ICD-10-CM | POA: Insufficient documentation

## 2014-09-01 DIAGNOSIS — Z791 Long term (current) use of non-steroidal anti-inflammatories (NSAID): Secondary | ICD-10-CM | POA: Insufficient documentation

## 2014-09-01 DIAGNOSIS — Z8739 Personal history of other diseases of the musculoskeletal system and connective tissue: Secondary | ICD-10-CM | POA: Diagnosis not present

## 2014-09-01 HISTORY — DX: Other lesions of oral mucosa: K13.79

## 2014-09-01 HISTORY — DX: Oral mucositis (ulcerative), unspecified: K12.30

## 2014-09-01 HISTORY — DX: Other forms of stomatitis: K12.1

## 2014-09-01 MED ORDER — MAGIC MOUTHWASH W/LIDOCAINE: 5.0000 mL | Freq: Four times a day (QID) | ORAL | Status: DC | PRN

## 2014-09-01 MED ORDER — HYDROCODONE-ACETAMINOPHEN 5-325 MG PO TABS: ORAL_TABLET | ORAL | Status: DC

## 2014-09-01 NOTE — ED Provider Notes (Signed)
CSN: [id number]     Arrival date & time 09/01/14  0754 History   First MD Initiated Contact with Patient 09/01/14 [phone]     Chief Complaint  Patient presents with  . Pain    mouth      HPI Pt was seen at 0820. Per pt, c/o gradual onset and persistence of constant acute flair of his chronic "mouth pain" for the past several years, worse over the past several months. Pt has been extensively been evaluated for same [facility] but has not f/u with either for over a year. Pt denies any change in his usual chronic symptoms. Denies fevers, no sore throat, no dysphagia, no N/V/D. The symptoms have been associated with no other complaints. The patient has a significant history of similar symptoms previously, recently being evaluated for this complaint and multiple prior evals for same.      Past Medical History  Diagnosis Date  . Behcet's disease   . MRSA (methicillin resistant staph aureus) culture positive   . Herpes   . Stomatitis and mucositis   . Recurrent mouth ulceration     "since 23 years old"   Past Surgical History  Procedure Laterality Date  . Biopsy pharynx     Family History  Problem Relation Age of Onset  . Heart disease Mother   . Lupus Neg Hx   . Crohn's disease Neg Hx    History  Substance Use Topics  . Smoking status: Current Every Day Smoker -- 0.25 packs/day    Types: Cigarettes  . Smokeless tobacco: Never Used  . Alcohol Use: Yes     Comment: occ    Review of Systems ROS: Statement: All systems negative except as marked or noted in the HPI; Constitutional: Negative for fever and chills. ; ; Eyes: Negative for eye pain, redness and discharge. ; ; ENMT: +tongue pain. Negative for ear pain, hoarseness, nasal congestion, sinus pressure and sore throat. ; ; Cardiovascular: Negative for chest pain, palpitations, diaphoresis, dyspnea and peripheral edema. ; ; Respiratory: Negative for cough, wheezing and stridor. ; ; Gastrointestinal: Negative  for nausea, vomiting, diarrhea, abdominal pain, blood in stool, hematemesis, jaundice and rectal bleeding. . ; ; Genitourinary: Negative for dysuria, flank pain and hematuria. ; ; Musculoskeletal: Negative for back pain and neck pain. Negative for swelling and trauma.; ; Skin: Negative for pruritus, rash, abrasions, blisters, bruising and skin lesion.; ; Neuro: Negative for headache, lightheadedness and neck stiffness. Negative for weakness, altered level of consciousness , altered mental status, extremity weakness, paresthesias, involuntary movement, seizure and syncope.      Allergies  Review of patient's allergies indicates no known allergies.  Home Medications   Prior to Admission medications   Medication Sig Start Date End Date Taking? Authorizing Provider  Alum & Mag Hydroxide-Simeth (MAGIC MOUTHWASH W/LIDOCAINE) SOLN Take 5 mLs by mouth 3 (three) times daily. Patient not taking: Reported on 09/01/2014 07/17/14   Renne CriglerJoshua Geiple, PA-C  Alum & Mag Hydroxide-Simeth (MAGIC MOUTHWASH W/LIDOCAINE) SOLN Take 5 mLs by mouth 4 (four) times daily as needed for mouth pain. 09/01/14   Samuel JesterKathleen Myley Bahner, DO  HYDROcodone-acetaminophen (NORCO/VICODIN) 5-325 MG per tablet Take 2 tablets by mouth every 4 (four) hours as needed for moderate pain. Patient not taking: Reported on 09/01/2014 07/17/14   Renne CriglerJoshua Geiple, PA-C  HYDROcodone-acetaminophen (NORCO/VICODIN) 5-325 MG per tablet 1 or 2 tabs PO q6 hours prn pain 09/01/14   Samuel JesterKathleen Vincie Linn, DO  naproxen (NAPROSYN) 375 MG  tablet Take 1 tablet (375 mg total) by mouth 2 (two) times daily. Patient not taking: Reported on 09/01/2014 05/25/14   April K Palumbo-Rasch, MD  nystatin (MYCOSTATIN) 100000 UNIT/ML suspension Take 5 mLs (500,000 Units total) by mouth 4 (four) times daily. Patient not taking: Reported on 09/01/2014 05/25/14   April K Palumbo-Rasch, MD  predniSONE (DELTASONE) 20 MG tablet 3 tabs po daily x 3 days, then 2 tabs x 3 days, then 1.5 tabs x 3 days,  then 1 tab x 3 days, then 0.5 tabs x 3 days Patient not taking: Reported on 09/01/2014 07/17/14   Renne CriglerJoshua Geiple, PA-C  predniSONE (DELTASONE) 50 MG tablet Take 1 tablet (50 mg total) by mouth daily. Patient not taking: Reported on 09/01/2014 08/20/14   Jamesetta Orleanshristopher W Lawyer, PA-C   BP 132/77 mmHg  Pulse 77  Temp(Src) 98.1 F (36.7 C) (Oral)  Resp 16  Ht 6' (1.829 m)  Wt 155 lb (70.308 kg)  BMI 21.02 kg/m2  SpO2 100% Physical Exam  0825: Physical examination:  Nursing notes reviewed; Vital signs and O2 SAT reviewed;  Constitutional: Well developed, Well nourished, Well hydrated, In no acute distress. Pt is actively using his cellphone during my exam.; Head:  Normocephalic, atraumatic; Eyes: EOMI, PERRL, No scleral icterus; ENMT:  Mucous membranes moist. +white lesions to tongue. No tonsillar exudates. No intra-oral edema. No submandibular or sublingual edema. No hoarse voice, no drooling, no stridor. No trismus.; Neck: Supple, Full range of motion, No lymphadenopathy; Cardiovascular: Regular rate and rhythm, No murmur, rub, or gallop; Respiratory: Breath sounds clear & equal bilaterally, No rales, rhonchi, wheezes.  Speaking full sentences with ease, Normal respiratory effort/excursion; Chest: Nontender, Movement normal; Abdomen: Soft, Nontender, Nondistended, Normal bowel sounds; Genitourinary: No CVA tenderness; Extremities: Pulses normal, No tenderness, No edema, No calf edema or asymmetry.; Neuro: AA&Ox3, Major CN grossly intact.  Speech clear. No gross focal motor or sensory deficits in extremities. Climbs on and off stretcher easily by himself. Gait steady.; Skin: Color normal, Warm, Dry.   ED Course  Procedures   MDM  MDM Reviewed: previous chart, nursing note and vitals Reviewed previous: labs     0840:  Long hx of chronic mouth pain with multiple ED visits for same. Pt has been checked for HIV/RPR most recently as last month. Previous HSV testing was negative, per EPIC chart  review. Pt endorses acute flair of his usual long standing chronic pain today, no change from his usual chronic pain pattern.  Pt has not f/u with his ID MD, Derm [facility] or [facility], for over a year. Pt strongly encouraged to f/u with the local ID MD for good continuity of care and control of his chronic medical condition.  Verb understanding.      Samuel JesterKathleen Daniqua Campoy, DO 09/04/14 2159

## 2014-09-01 NOTE — Discharge Instructions (Signed)
°Emergency Department Resource Guide °1) Find a Doctor and Pay Out of Pocket °Although you won't have to find out who is covered by your insurance plan, it is a good idea to ask around and get recommendations. You will then need to call the office and see if the doctor you have chosen will accept you as a new patient and what types of options they offer for patients who are self-pay. Some doctors offer discounts or will set up payment plans for their patients who do not have insurance, but you will need to ask so you aren't surprised when you get to your appointment. ° °2) Contact Your Local Health Department °Not all health departments have doctors that can see patients for sick visits, but many do, so it is worth a call to see if yours does. If you don't know where your local health department is, you can check in your phone book. The CDC also has a tool to help you locate your state's health department, and many state websites also have listings of all of their local health departments. ° °3) Find a Walk-in Clinic °If your illness is not likely to be very severe or complicated, you may want to try a walk in clinic. These are popping up all over the country in pharmacies, drugstores, and shopping centers. They're usually staffed by nurse practitioners or physician assistants that have been trained to treat common illnesses and complaints. They're usually fairly quick and inexpensive. However, if you have serious medical issues or chronic medical problems, these are probably not your best option. ° °No Primary Care Doctor: °- Call [facility] at  832-8000 - they can help you locate a primary care doctor that  accepts your insurance, provides certain services, etc. °- Physician Referral Service- 1-800-533-3463 ° °Chronic Pain Problems: °Organization         Address  Phone   Notes  °[facility]  (336) 297-2271 Patients need to be referred by their primary care doctor.  ° °Medication  Assistance: °Organization         Address  Phone   Notes  °[place] [facility] [address] °[place], [place] [address] (336) 641-8030 --Must be a resident of [place] °-- Must have NO insurance coverage whatsoever (no Medicaid/ Medicare, etc.) °-- The pt. MUST have a primary care doctor that directs their care regularly and follows them in the community °  °[facility]  (866) 331-1348   °[employer]  (888) 892-1162   ° °Agencies that provide inexpensive medical care: °Organization         Address  Phone   Notes  °[facility]  (336) 832-8035   °[facility]    (336) 832-7272   °[facility] [address] °[place], [place] [address] (336) 832-4777   °[facility] [address], °[place] (336) 271-4999   °[facility]    (336) 373-0678   °[facility]    (336) 272-1050   °[facility] ° [address], [place] Phone:  (336) 832-4444, Fax:  (336) 832-4440 Hours of Operation:  9 am - 6 pm, M-F.  Also accepts Medicaid/Medicare and self-pay.  °[facility] ° [address], [place] Phone: (336) 832-3150, Fax: (336) 832-3151. Hours of Operation:  8:30 am - 5:30 pm, M-F.  Also accepts Medicaid and self-pay.  °[facility] [place] [address], [place] Phone: (336) 878-6027   °[facility] [address], [place], [place] (336)723-1848, Ext. 123 Mondays & Thursdays: 7-9 AM.  First 15 patients are seen on a first come, first serve basis. °  ° °Medicaid-accepting [place] Providers: ° °Organization         Address  Phone   Notes  °[facility] [address], [place] (336) 641-2100 Also accepts self-pay patients.  °[facility] [address], [place] ° (336) 856-9996   °[facility] [address], [place]  (336) 288-8857   °[facility] [address] [place] [address], [place] (336) 299-7000   °Veita Bland [address], [place]  ° (336) 373-1557 Only accepts [place] Access Medicaid patients after they have their name applied to their card.  ° °Self-Pay (no insurance) in [place]: ° °Organization         Address  Phone   Notes  °Sickle Cell Patients, [facility] [address], [place] (336) 832-1970   °[facility] [address], [place] (336) 832-4400   °[facility] ° [address], [place] (336) 992-4800   °[facility]/Dr. Osei-Bonsu ° [address] [place] [address], [place] or [address], [place] (336) 841-8500 Phone number for both [place] and [place] locations is the same.  °[facility] [address], [place] (336) 299-0000   °[facility] [address] [place] [address], [place] or [address] (336) 852-7530 °(336) 878-2260   °[facility] [address], [place] (336) 350-1642, phone; (336) 294-5005, fax Sees patients 1st and 3rd Saturday of every month.  Must not qualify for public or private insurance (i.e. Medicaid, Medicare, [place] Health Choice, Veterans' Benefits) • Household income should be no more than 200% of the poverty level •The clinic cannot treat you if you are pregnant or think you are pregnant • Sexually transmitted diseases are not treated at the clinic.  ° ° °Dental Care: °Organization         Address  Phone  Notes  °[place] [facility] [address], [place] (336) 641-6152 Accepts children up to age 21 who are enrolled in Medicaid or [place] Health Choice; pregnant women with a Medicaid card; and children who have applied for Medicaid or [place] Health Choice, but were declined, whose parents can pay a reduced fee at time of service.  °[place]  [facility] [place]  [address], [place] (336) 641-7733 Accepts children up to age 21 who are enrolled in Medicaid or [place] Health Choice; pregnant women with a Medicaid card; and children who have applied for Medicaid or [place] Health Choice, but were declined, whose parents can pay a reduced fee at time of service.  °[facility] ° [address], [place] (336) 641-4533 Patients are seen by appointment only. Walk-ins are not accepted. [facility] will see patients 18 years of age and older. °Monday - Tuesday (8am-5pm) °Most Wednesdays (8:30-5pm) °$30 per visit, cash only  °[facility] ° [address], [place] (336) 641-4533 Patients are seen by appointment only. Walk-ins are not accepted. [facility] will see patients 18 years of age and older. °One   Wednesday Evening (Monthly: Volunteer Based).  $30 per visit, cash only  °[facility]  (919) 537-3737 for adults; Children under age 4, call Graduate Pediatric Dentistry at ([phone]) [phone]. Children aged 4-14, please call ([phone]) [phone] to request a pediatric application. ° Dental services are provided in all areas of dental care including fillings, crowns and bridges, complete and partial dentures, implants, gum treatment, root canals, and extractions. Preventive care is also provided. Treatment is provided to both adults and children. °Patients are selected via a lottery and there is often a waiting list. °  °[facility] [address], °[place] ° (336) 763-8833 [email or web address] °  °[facility] [address], [place], [place] (336)723-1848, Ext. 123 Second and Fourth Thursday of each month, opens at 6:30 AM; Clinic ends at 9 AM.  Patients are seen on a first-come first-served basis, and a limited number are seen during each clinic.  ° °[facility] ° [address], [place], [place] (336) 723-7904    Eligibility Requirements °You must have lived in [place], [place], or [place] counties for at least the last three months. °  You cannot be eligible for state or federal sponsored healthcare insurance, including Veterans Administration, Medicaid, or Medicare. °  You generally cannot be eligible for healthcare insurance through your employer.  °  How to apply: °Eligibility screenings are held every Tuesday and Wednesday afternoon from 1:00 pm until 4:00 pm. You do not need an appointment for the interview!  °[facility] [address], [place], [place] 336-631-2330   °[facility]  336-342-8273   °[facility]  336-703-3100   °[facility]  336-570-6415   ° °Behavioral Health Resources in the Community: °Intensive Outpatient Programs °Organization         Address  Phone  Notes  °[facility] [address], [place], [place] 336-878-6098   °[facility] [address], [place], [place] 336-832-9800   °[facility] [address], [place], [place] ° 336-882-2125   °[facility] [address],  °[place], [place] 1-800-853-5163 or 336-641-4981   °Substance Abuse Resources °Organization         Address  Phone  Notes  °[facility]  336-882-2125   °[facility]  336-784-9470   °[facility]  336-285-9073   °[facility]  336-845-3988   °Residential & Outpatient Substance Abuse Program  1-800-659-3381   °Psychological Services °Organization         Address  Phone  Notes  °[facility]  336- 832-9600   °[facility]  336- 378-7881   °[facility] [address], [place] 1-800-853-5163 or 336-641-4981   ° °Mobile Crisis Teams °Organization         Address  Phone  Notes  °[facility], Mobile Crisis Care Unit  1-877-626-1772   °Assertive °[facility] ° [address]  [place], [place] 336-834-9664   °Sharon DeEsch [address] °[place] 336-554-5454   ° °Self-Help/Support Groups °Organization         Address  Phone             Notes  °[facility]  - variety of support groups  336- 373-1402 Call for more information  °Narcotics Anonymous (NA), [facility] [address], °[place]  2 meetings at this location  ° °  Residential Treatment Programs Organization         Address  Phone  Notes  [facility] [address],    [place]  [phone]   [facility]  [address], [place] [phone], [place], [place] [phone]) [phone]   [facility] [address], [place] [phone] Admissions: 8am-3pm M-F  [facility] [address]    [place], [place] [phone]   [facility] [address], [place], [place] [phone]   [facility] [address]  [place], [place] [phone]   [facility] Intensive Outpatient [address], [place], [place] [phone]   [facility] ([facility] [address]  [place], [place] [phone] or [phone]   [facility] ([facility] [address] [place], [place] 323-557-3220([phone] Accepts Medicaid  [facility] [address]  [place] [phone] Substance Abuse/Addiction Treatment   [facility]         Address  Phone  Notes  [facility]  979-203-1701(888) 734-769-1523   Angie FavaJulie Brannon, PhD [address] [place], [place]   [phone](336) [phone] or ([phone](336) [phone]   [facility]   [address] [place], [place] ([phone]) [phone](336) [phone]   [facility] [address], [place], [place] [phone](336) [phone] Insurance/Medicaid/sponsorship through [facility]  [facility] [address]                                    [place], [place] [phone]) [phone] Therapy/tele-psych/case    [facility] [address]   [place], [place] [phone](336) [phone]    Dr. Lolly MustacheArfeen  [phone]) [phone]   [facility] [place]  [employer] [facility] 1) [address], [place] 2) [address], [place] 3)  [address], [place] [phone](336) [phone](336) [phone]  [phone](336) [phone]   [facility] Child Abuse Hotline (636) 163-0578(336) [phone] or 225-003-7262(336) [phone] (After Hours)      Take the prescriptions as directed.  Call the Infectious Disease doctor today to schedule a follow up appointment this week.  Return to the Emergency Department immediately sooner if worsening.

## 2014-09-01 NOTE — ED Notes (Signed)
Pt presents with NAD. Seen 2 weeks ago for presenting complaint and given prednisone. Pt here because symptoms have not improved. Denies N/V/D and fever

## 2014-09-08 ENCOUNTER — Emergency Department (HOSPITAL_COMMUNITY)
Admission: EM | Admit: 2014-09-08 | Discharge: 2014-09-08 | Disposition: A | Discharge status: Home / Self Care | Payer: PRIVATE HEALTH INSURANCE | Attending: Emergency Medicine | Admitting: Emergency Medicine

## 2014-09-08 ENCOUNTER — Encounter (HOSPITAL_COMMUNITY): Payer: Self-pay

## 2014-09-08 DIAGNOSIS — K121 Other forms of stomatitis: Secondary | ICD-10-CM | POA: Insufficient documentation

## 2014-09-08 DIAGNOSIS — Z8739 Personal history of other diseases of the musculoskeletal system and connective tissue: Secondary | ICD-10-CM | POA: Diagnosis not present

## 2014-09-08 DIAGNOSIS — Z720 Tobacco use: Secondary | ICD-10-CM | POA: Diagnosis not present

## 2014-09-08 DIAGNOSIS — K1379 Other lesions of oral mucosa: Secondary | ICD-10-CM | POA: Diagnosis present

## 2014-09-08 DIAGNOSIS — Z8614 Personal history of Methicillin resistant Staphylococcus aureus infection: Secondary | ICD-10-CM | POA: Diagnosis not present

## 2014-09-08 DIAGNOSIS — Z8619 Personal history of other infectious and parasitic diseases: Secondary | ICD-10-CM | POA: Insufficient documentation

## 2014-09-08 LAB — CBC WITH DIFFERENTIAL/PLATELET
Basophils Absolute: 0 10*3/uL (ref 0.0–0.1)
Basophils Relative: 0 % (ref 0–1)
EOS ABS: 0.2 10*3/uL (ref 0.0–0.7)
EOS PCT: 3 % (ref 0–5)
HCT: 43.9 % (ref 39.0–52.0)
Hemoglobin: 15 g/dL (ref 13.0–17.0)
Lymphocytes Relative: 22 % (ref 12–46)
Lymphs Abs: 1.5 10*3/uL (ref 0.7–4.0)
MCH: 30.6 pg (ref 26.0–34.0)
MCHC: 34.2 g/dL (ref 30.0–36.0)
MCV: 89.6 fL (ref 78.0–100.0)
MONOS PCT: 19 % — AB (ref 3–12)
Monocytes Absolute: 1.2 10*3/uL — ABNORMAL HIGH (ref 0.1–1.0)
Neutro Abs: 3.8 10*3/uL (ref 1.7–7.7)
Neutrophils Relative %: 56 % (ref 43–77)
PLATELETS: 185 10*3/uL (ref 150–400)
RBC: 4.9 MIL/uL (ref 4.22–5.81)
RDW: 11.8 % (ref 11.5–15.5)
WBC: 6.7 10*3/uL (ref 4.0–10.5)

## 2014-09-08 LAB — BASIC METABOLIC PANEL
Anion gap: 19 — ABNORMAL HIGH (ref 5–15)
BUN: 16 mg/dL (ref 6–23)
CALCIUM: 9.6 mg/dL (ref 8.4–10.5)
CO2: 20 mEq/L (ref 19–32)
Chloride: 102 mEq/L (ref 96–112)
Creatinine, Ser: 1.03 mg/dL (ref 0.50–1.35)
GLUCOSE: 77 mg/dL (ref 70–99)
Potassium: 4.5 mEq/L (ref 3.7–5.3)
SODIUM: 141 meq/L (ref 137–147)

## 2014-09-08 MED ORDER — SODIUM CHLORIDE 0.9 % IV BOLUS (SEPSIS)
1000.0000 mL | Freq: Once | INTRAVENOUS | Status: AC
  Administered 2014-09-08: 1000 mL via INTRAVENOUS

## 2014-09-08 MED ORDER — MAGIC MOUTHWASH W/LIDOCAINE: 5.0000 mL | Freq: Three times a day (TID) | ORAL | Status: DC | PRN

## 2014-09-08 MED ORDER — LIDOCAINE VISCOUS 2 % MT SOLN
15.0000 mL | Freq: Once | OROMUCOSAL | Status: AC
  Administered 2014-09-08: 15 mL via OROMUCOSAL
  Filled 2014-09-08: qty 15

## 2014-09-08 MED ORDER — PREDNISONE 20 MG PO TABS
40.0000 mg | ORAL_TABLET | Freq: Once | ORAL | Status: AC
  Administered 2014-09-08: 40 mg via ORAL
  Filled 2014-09-08: qty 2

## 2014-09-08 MED ORDER — PREDNISONE 50 MG PO TABS: ORAL_TABLET | ORAL | Status: DC

## 2014-09-08 MED ORDER — SUCRALFATE 1 GM/10ML PO SUSP: 1.0000 g | Freq: Three times a day (TID) | ORAL | Status: DC

## 2014-09-08 NOTE — ED Notes (Signed)
[facility] Significant other [phone] call for discharge, admit or any updates

## 2014-09-08 NOTE — ED Notes (Signed)
Pt has Bechet's disease.  Pt has had mouth sores x 2 weeks. Not able to eat and drink.  Drainage from sores

## 2014-09-08 NOTE — Discharge Instructions (Signed)
Please follow the directions provided.  Use the resource guide to find a primary care provider to help manage these sores in your mouth and to prescribe your prednisone. Use the carafate before meals to help with pain.  Continue to use the magic mouth wash to help with pain also.      SEEK IMMEDIATE MEDICAL CARE IF:  You have a fever.  You develop pain, redness, or sores around one or both eyes.  You cannot eat or drink because of pain or other symptoms.  You develop worsening weakness, or you faint.  You develop vomiting or diarrhea.  You develop chest pain, shortness of breath, or rapid and irregular heartbeats.    Emergency Department Resource Guide 1) Find a Doctor and Pay Out of Pocket Although you won't have to find out who is covered by your insurance plan, it is a good idea to ask around and get recommendations. You will then need to call the office and see if the doctor you have chosen will accept you as a new patient and what types of options they offer for patients who are self-pay. Some doctors offer discounts or will set up payment plans for their patients who do not have insurance, but you will need to ask so you aren't surprised when you get to your appointment.  2) Contact Your Local Health Department Not all health departments have doctors that can see patients for sick visits, but many do, so it is worth a call to see if yours does. If you don't know where your local health department is, you can check in your phone book. The CDC also has a tool to help you locate your state's health department, and many state websites also have listings of all of their local health departments.  3) Find a Walk-in Clinic If your illness is not likely to be very severe or complicated, you may want to try a walk in clinic. These are popping up all over the country in pharmacies, drugstores, and shopping centers. They're usually staffed by nurse practitioners or physician assistants that have been  trained to treat common illnesses and complaints. They're usually fairly quick and inexpensive. However, if you have serious medical issues or chronic medical problems, these are probably not your best option.  No Primary Care Doctor: - Call Health Connect at  [phone] - they can help you locate a primary care doctor that  accepts your insurance, provides certain services, etc. - Physician Referral Service- [phone]  Chronic Pain Problems: Organization         Address  Phone   Notes  [facility]  [phone] Patients need to be referred by their primary care doctor.   Medication Assistance: Organization         Address  Phone   Notes  [facility] [address] [place], [place] [address] [phone] --Must be a resident of [facility] [place] -- Must have NO insurance coverage whatsoever (no Medicaid/ Medicare, etc.) -- The pt. MUST have a primary care doctor that directs their care regularly and follows them in the community   Med[facility]ist  [phone]   [employer]  [phone]    Agencies that provide inexpensive medical care: Organization         Address  Phone   Notes  [facility]  [phone]   [facility]    [phone]   [facility] [address] [place], [place] [address] [phone]   [facility] [address], [place] [phone]   [facility]anned Parenthood    [phone]   [facility]    [phone]   [facility]  [address], [place] Phone:  [phone], Fax:  [phone] Hours of Operation:  9 am - 6 pm, M-F.  Also accepts Medicaid/Medicare and self-pay.  [facility]  [address], [place] Phone: [phone], Fax: [phone]) [phone]. Hours of Operation:  8:30 am - 5:30 pm, M-F.  Also accepts Medicaid and self-pay.   [facility] [place] [address], [place] Phone: [phone]   [facility] [address], [place], [place] [phone], Ext. [phone] Mondays & Thursdays: 7-9 AM.  First 15 patients are seen on a first come, first serve basis.    Medicaid-accepting [facility] Providers:  Organization         Address  Phone   Notes  [facility] [address], [place] [phone] Also accepts self-pay patients.  [facility] [address], [place]  [phone]   [facility] [address], [place] [phone]   [facility] [address] [place] [address], [place] [phone]   Renaye RakersVeita Bland [address], [place]   [phone] Only accepts [place] Access [place] patients after they have their name applied to their card.   Self-Pay (no insurance) in [facility] [place]:  Organization         Address  Phone   Notes  Sickle Cell Patients, [facility] [address], [place] [phone]   [facility] [address], [place] [phone]) [phone]   [facility]  [address], [place] [phone]   [facility]/Dr. Osei-Bonsu  [address] [place] [address], [place] or [phone], [place] [phone] Phone number for both [place] and [place] locations is the same.  [facility] [address], [place] [phone]   [facility] [place] [address] [place] [address], [place] or [address] [phone]   [facility] [address], [place] [phone], phone; [phone], fax Sees patients 1st and 3rd Saturday of every month.  Must not qualify for public or private insurance (i.e. Medicaid, Medicare, [place] Health Choice, Veterans' Benefits)  Household income should be no  more than 200% of the poverty level The clinic cannot treat you if you are pregnant or think you are pregnant  Sexually transmitted diseases are not treated at the clinic.    Dental Care: Organization         Address  Phone  Notes  [facility] [address], [place] [phone] Accepts children up to age 25 who are enrolled in [place] or [place] Health Choice; pregnant women with a Medicaid card; and children who have applied for Medicaid or [place] Health Choice, but were declined, whose parents can pay a reduced fee at time of service.  [facility] of [facility] [place]  [address], [place] [phone] Accepts children up to age 25 who are enrolled in  Medicaid or  Health Choice; pregnant women with a Medicaid card; and children who have applied for Medicaid or  Health Choice, but were declined, whose parents can pay a reduced fee at time of service.  [facility]  [address] [place], [place] [phone] Patients are seen by appointment only. Walk-ins are not accepted. [facility] will see patients 69 years of age and older. Monday - Tuesday (8am-5pm) Most Wednesdays (8:30-5pm) $30 per visit, cash only  [facility] Access PRO[facility]  [address], [facility] [phone] Patients are seen by appointment only. Walk-ins are not accepted. [facility] will see patients 20 years of age and older. One Wednesday Evening (Monthly: Volunteer Based).  $30 per visit, cash only  [employer]  [phone] for adults; Children under age 37, call Graduate Pediatric Dentistry at [phone]. Children aged 102-14, please call [phone] to request a pediatric application.  Dental services are provided in all areas of dental care including fillings, crowns and bridges, complete and partial dentures, implants, gum treatment, root canals,  and extractions. Preventive care is also provided. Treatment is provided to both adults and children. Patients are selected via a lottery and there is often a waiting list.   [facility] [address] [place]  [phone] [email or web address]   [facility] [address] [place], [place] [phone], Ext. [phone] Second and Fourth Thursday of each month, opens at 6:30 AM; Clinic ends at 9 AM.  Patients are seen on a first-come first-served basis, and a limited number are seen during each clinic.   [facility]  [address] [place] [place], [place] [phone]   Eligibility Requirements You must have lived in [place], [place], or [place] counties for at least the last three months.   You cannot be eligible for state or federal sponsored [place], including [employer], [place], or [employer].   You generally cannot be eligible for healthcare insurance through your employer.    How to apply: Eligibility screenings are held every Tuesday and Wednesday afternoon from 1:00 pm until 4:00 pm. You do not need an appointment for the interview!  [facility] [address] [place], [place] [phone]   [facility] Health Department  [phone]   [facility] Health Department  [phone]   [facility] Health Department  [phone]    Behavioral Health Resources in the Community: Intensive Outpatient Programs Organization         Address  Phone  Notes  [facility] Services [address], [place], [place] [phone]   [facility]patient [address] [place], [place] [phone]   [facility]: [facility] [address] [place], [place]  [phone]   [facility] Mental Health [address],  [place], [place] [phone] or [phone]   Substance Abuse Resources Organization         Address  Phone  Notes  Al[facility] Drug Services  [phone]    [facility]  [phone]   [facility] [place]  [phone]   Floydene Flock  ([phone]   Residential & Outpatient Substance Abuse Program  [phone]   Psychological Services Organization         Address  Phone  Notes  [facility] Behavioral Health  [phone]   [facility]  [phone]   [facility] Mental Health [address] [place] [phone] or  [phone]    Mobile Crisis Teams Organization         Address  Phone  Notes  [facility], Mobile Crisis Care Unit  [phone]   [facility]  [address] [place], [place] [phone]   [facility] [address] [place] [phone]    Self-Help/Support Groups Organization         Address  Phone             Notes  [facility] [place] - variety of support groups  [phone]- [phone] Call for more information  Narcotics Anonymous (NA), [facility] [address], [employer] [place]  2 meetings at this location   Statistician         Address  Phone  Notes  [facility] [address],    [place]  [phone]   [facility]  [address], [place] [address], [place], [place] [phone]   [facility] [address] [place], [place] [phone] Admissions: 8am-3pm M-F  [facility] [address]    [place], [place] [phone]   [facility] [address] [place], [place], [place] [phone]   [facility] [address]  [place], [place] [phone]   [facility] - Intensive Outpatient [address], [place], [place] [phone]   [facility] ([facility]iction Recovery [facility] [address]  [place], [place] [phone] or [phone]   [facility] ([facility]) [address] [place], [place] [phone] Accepts Medicaid  [facility] [place] [address]    [place] [phone] Substance Abuse/Addiction Treatment   [facility] Organization         Address  Phone  Notes  [facility]  [phone]   Angie Fava, PhD [address] [place], [place]   [phone] or [phone]   [facility] Behavioral   [address] [place], [place] ([phone]   [facility] [address] [place], [place] [phone] Insurance/Medicaid/sponsorship through [facility] and Families [address]                                    [place], [place] [phone] Therapy/tele-psych/case  [facility] [address], [place] [phone]    Dr. Lolly Mustache  [phone]   [facility] [place]  [employer] [facility] Dept. 1) [address] [place] 2) [address], [place] 3)  [address], [place] [phone]  [phone]   [facility] Child Abuse Hotline [phone] or [phone] (After Hours)

## 2014-09-08 NOTE — ED Provider Notes (Signed)
CSN: [id number]     Arrival date & time 09/08/14  1732 History   First MD Initiated Contact with Patient 09/08/14 1849     Chief Complaint  Patient presents with  . 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  His SO states he usually comes here to get his steroids but he was only given a few doses the last time he was here and so the lesions have returned. She [facility] but there is a delay with getting records transferred.  She reports he has not been able to eat or drink for the last 4 days.  He also endorses a fever and chills at home.  He denies vomiting or diarrhea.    Past Medical History  Diagnosis Date  . Behcet's disease   . MRSA (methicillin resistant staph aureus) culture positive   . Herpes   . Stomatitis and mucositis   . Recurrent mouth ulceration     "since 23 years old"   Past Surgical History  Procedure Laterality Date  . Biopsy pharynx     Family History  Problem Relation Age of Onset  . Heart disease Mother   . Lupus Neg Hx   . Crohn's disease Neg Hx    History  Substance Use Topics  . Smoking status: Current Every Day Smoker -- 0.25 packs/day    Types: Cigarettes  . Smokeless tobacco: Never Used  . Alcohol Use: Yes     Comment: occ    Review of Systems  Constitutional: Positive for appetite change. Negative for fever and chills.  HENT: Positive for mouth sores. Negative for sore throat.   Eyes: Negative for visual disturbance.  Respiratory: Negative for cough and shortness of breath.   Cardiovascular: Negative for chest pain and leg swelling.  Gastrointestinal: Negative for nausea, vomiting and diarrhea.  Genitourinary: Negative for dysuria.  Musculoskeletal: Negative for myalgias.  Skin: Negative for rash.  Neurological:  Negative for weakness, numbness and headaches.    Allergies  Review of patient's allergies indicates no known allergies.  Home Medications   Prior to Admission medications   Medication Sig Start Date End Date Taking? Authorizing Provider  Alum & Mag Hydroxide-Simeth (MAGIC MOUTHWASH W/LIDOCAINE) SOLN Take 5 mLs by mouth 3 (three) times daily. Patient not taking: Reported on 09/01/2014 07/17/14   Renne CriglerJoshua Geiple, PA-C  Alum & Mag Hydroxide-Simeth (MAGIC MOUTHWASH W/LIDOCAINE) SOLN Take 5 mLs by mouth 4 (four) times daily as needed for mouth pain. Patient not taking: Reported on 09/08/2014 09/01/14   Samuel JesterKathleen McManus, DO  HYDROcodone-acetaminophen (NORCO/VICODIN) 5-325 MG per tablet Take 2 tablets by mouth every 4 (four) hours as needed for moderate pain. Patient not taking: Reported on 09/01/2014 07/17/14   Renne CriglerJoshua Geiple, PA-C  HYDROcodone-acetaminophen (NORCO/VICODIN) 5-325 MG per tablet 1 or 2 tabs PO q6 hours prn pain Patient not taking: Reported on 09/08/2014 09/01/14   Samuel JesterKathleen McManus, DO  naproxen (NAPROSYN) 375 MG tablet Take 1 tablet (375 mg total) by mouth 2 (two) times daily. Patient not taking: Reported on 09/01/2014 05/25/14   April K Palumbo-Rasch, MD  nystatin (MYCOSTATIN) 100000 UNIT/ML suspension Take 5 mLs (500,000 Units total) by mouth 4 (four) times daily. Patient not taking: Reported on 09/01/2014 05/25/14   April K Palumbo-Rasch, MD  predniSONE (DELTASONE) 20 MG tablet 3 tabs po daily x 3 days, then 2 tabs  x 3 days, then 1.5 tabs x 3 days, then 1 tab x 3 days, then 0.5 tabs x 3 days Patient not taking: Reported on 09/01/2014 07/17/14   Renne Crigler, PA-C  predniSONE (DELTASONE) 50 MG tablet Take 1 tablet (50 mg total) by mouth daily. Patient not taking: Reported on 09/01/2014 08/20/14   Jamesetta Orleans Lawyer, PA-C   BP 122/69 mmHg  Pulse 122  Temp(Src) 99.6 F (37.6 C) (Oral)  Resp 18  SpO2 99% Physical Exam  Constitutional: He appears well-developed and well-nourished.  No distress.  HENT:  Head: Normocephalic and atraumatic.  Mouth/Throat: Oropharynx is clear and moist. No oropharyngeal exudate.    Eyes: Conjunctivae are normal.  Neck: Neck supple. No thyromegaly present.  Cardiovascular: Normal rate, regular rhythm and intact distal pulses.   Pulmonary/Chest: Effort normal and breath sounds normal. No respiratory distress. He has no wheezes. He has no rales. He exhibits no tenderness.  Abdominal: Soft. There is no tenderness.  Musculoskeletal: He exhibits no tenderness.  Lymphadenopathy:    He has no cervical adenopathy.  Neurological: He is alert.  Skin: Skin is warm and dry. No rash noted. He is not diaphoretic.  Psychiatric: He has a normal mood and affect.  Nursing note and vitals reviewed.   ED Course  Procedures (including critical care time) Labs Review Labs Reviewed  CBC WITH DIFFERENTIAL - Abnormal; Notable for the following:    Monocytes Relative 19 (*)    Monocytes Absolute 1.2 (*)    All other components within normal limits  BASIC METABOLIC PANEL - Abnormal; Notable for the following:    Anion gap 19 (*)    All other components within normal limits    Imaging Review No results found.   EKG Interpretation None      MDM   Final diagnoses:  Stomatitis   23 yo presenting with recurrent problem of painful oral lesions as a result of his Bechet's disease.  Pt had been instructed to follow-up with specialist but has not because of insurance issues.  He has been coming to the ED for his steroid prescriptions.  He reports difficulty drinking lately and has an anion gap of 19 consistent with dehydration.  NS bolus, prednisone and viscous lidocaine given. Pt's symptoms have improved enough that he is able to drink oral fluids in the ED.  Pt will be discharged with a short course of steroids, carafate and Magic mouthwash.  Resource guide provided for pt to establish care with PCP and discussed importance of a primary physician to  manage his symptoms and steroid prescriptions.  He  is well-appearing, in no acute distress and vital signs are stable.  They appear safe to be discharged.  Discharge include follow-up with their PCP.  Return precautions provided.  Pt aware of plan and in agreement.   Filed Vitals:   09/08/14 1810 09/08/14 2116  BP: 122/69 139/81  Pulse: 122 98  Temp: 99.6 F (37.6 C)   TempSrc: Oral   Resp: 18 17  SpO2: 99% 100%   Meds given in ED:  Medications  sodium chloride 0.9 % bolus 1,000 mL (0 mLs Intravenous Stopped 09/08/14 2048)  lidocaine (XYLOCAINE) 2 % viscous mouth solution 15 mL (15 mLs Mouth/Throat Given 09/08/14 1934)  predniSONE (DELTASONE) tablet 40 mg (40 mg Oral Given 09/08/14 2128)    Discharge Medication List as of 09/08/2014  9:19 PM    START taking these medications   Details  sucralfate (CARAFATE) 1 GM/10ML suspension Take 10 mLs (  1 g total) by mouth 4 (four) times daily -  with meals and at bedtime., Starting 09/08/2014, Until Discontinued, Print       09/08/14 0000  sucralfate (CARAFATE) 1 GM/10ML suspension 3 times daily with meals & bedtime Discontinue Reprint 09/08/14 2111   09/08/14 0000  predniSONE (DELTASONE) 50 MG tablet Discontinue Reprint 09/08/14 2111   09/08/14 0000  Alum & Mag Hydroxide-Simeth (MAGIC MOUTHWASH W/LIDOCAINE) SOLN 3 times daily PRN Discontinue Reprint 09/08/14 2119          Harle BattiestElizabeth Jachin Coury, NP 09/11/14 [id number]  Gwyneth SproutWhitney Plunkett, MD 09/11/14 2346

## 2014-11-17 ENCOUNTER — Emergency Department (HOSPITAL_COMMUNITY)
Admission: EM | Admit: 2014-11-17 | Discharge: 2014-11-17 | Disposition: A | Discharge status: Home / Self Care | Payer: PRIVATE HEALTH INSURANCE | Attending: Emergency Medicine | Admitting: Emergency Medicine

## 2014-11-17 ENCOUNTER — Encounter (HOSPITAL_COMMUNITY): Payer: Self-pay

## 2014-11-17 DIAGNOSIS — Z8719 Personal history of other diseases of the digestive system: Secondary | ICD-10-CM

## 2014-11-17 DIAGNOSIS — Z720 Tobacco use: Secondary | ICD-10-CM | POA: Insufficient documentation

## 2014-11-17 DIAGNOSIS — Z8614 Personal history of Methicillin resistant Staphylococcus aureus infection: Secondary | ICD-10-CM | POA: Insufficient documentation

## 2014-11-17 DIAGNOSIS — K1379 Other lesions of oral mucosa: Secondary | ICD-10-CM | POA: Insufficient documentation

## 2014-11-17 DIAGNOSIS — Z8619 Personal history of other infectious and parasitic diseases: Secondary | ICD-10-CM | POA: Insufficient documentation

## 2014-11-17 DIAGNOSIS — Z8739 Personal history of other diseases of the musculoskeletal system and connective tissue: Secondary | ICD-10-CM | POA: Insufficient documentation

## 2014-11-17 MED ORDER — MAGIC MOUTHWASH W/LIDOCAINE: 5.0000 mL | Freq: Three times a day (TID) | ORAL | Status: DC | PRN

## 2014-11-17 MED ORDER — FLUCONAZOLE 200 MG PO TABS: 200.0000 mg | ORAL_TABLET | Freq: Every day | ORAL | Status: AC

## 2014-11-17 MED ORDER — PREDNISONE 20 MG PO TABS: 40.0000 mg | ORAL_TABLET | Freq: Every day | ORAL | Status: DC

## 2014-11-17 MED ORDER — HYDROCODONE-ACETAMINOPHEN 7.5-325 MG/15ML PO SOLN: 10.0000 mL | Freq: Four times a day (QID) | ORAL | Status: DC | PRN

## 2014-11-17 NOTE — ED Notes (Signed)
Pt alert x4 respirations easy non labored.  

## 2014-11-17 NOTE — Discharge Instructions (Signed)
Call for a follow up appointment with a Family or Primary Care Provider.  Return if Symptoms worsen.   Take medication as prescribed.  Drink plenty of fluids. Make sure you have good oral hygiene, saltwater gargles with active lesions. Follow up with Dr. Jenne PaneBates, or [facility] previously established providers.  Emergency Department Resource Guide 1) Find a Doctor and Pay Out of Pocket Although you won't have to find out who is covered by your insurance plan, it is a good idea to ask around and get recommendations. You will then need to call the office and see if the doctor you have chosen will accept you as a new patient and what types of options they offer for patients who are self-pay. Some doctors offer discounts or will set up payment plans for their patients who do not have insurance, but you will need to ask so you aren't surprised when you get to your appointment.  2) Contact Your Local Health Department Not all health departments have doctors that can see patients for sick visits, but many do, so it is worth a call to see if yours does. If you don't know where your local health department is, you can check in your phone book. The CDC also has a tool to help you locate your state's health department, and many state websites also have listings of all of their local health departments.  3) Find a Walk-in Clinic If your illness is not likely to be very severe or complicated, you may want to try a walk in clinic. These are popping up all over the country in pharmacies, drugstores, and shopping centers. They're usually staffed by nurse practitioners or physician assistants that have been trained to treat common illnesses and complaints. They're usually fairly quick and inexpensive. However, if you have serious medical issues or chronic medical problems, these are probably not your best option.  No Primary Care Doctor: - Call Health Connect at  [phone] - they can help you locate a primary care doctor  that  accepts your insurance, provides certain services, etc. - Physician Referral Service- [phone]  Chronic Pain Problems: Organization         Address  Phone   Notes  [facility]  [phone] Patients need to be referred by their primary care doctor.   Medication Assistance: Organization         Address  Phone   Notes  [facility] Medication [facility] [address] [place], [place] [address] [phone] --Must be a resident of [facility] [place] -- Must have NO insurance coverage whatsoever (no Medicaid/ Medicare, etc.) -- The pt. MUST have a primary care doctor that directs their care regularly and follows them in the community   MedAssist  [phone]) [phone]   [employer]  [phone]    Agencies that provide inexpensive medical care: Organization         Address  Phone   Notes  [facility]  [phone]) [phone]   [facility]    [phone]) [phone]   [facility] [address] [place], [place] [address] [phone]) [phone]   [facility] [address], [place] [phone]) [phone]   Planned Parenthood    [phone]   [facility]    [phone]) [phone]   [facility] and [facility]  [address],  Phone:  [phone], Fax:  [phone] Hours of Operation:  9  am - 6 pm, M-F.  Also accepts Medicaid/Medicare and self-pay.  [facility]  [address], [place] Phone: [phone]) [phone], Fax: [phone]) [phone]. Hours of Operation:  8:30 am - 5:30 pm, M-F.  Also accepts Medicaid and self-pay.  [facility] [place] [address], [place] Phone: [phone](336) [phone]   [facility] [address], [place], [place] [phone], Ext. [phone] Mondays & Thursdays: 7-9 AM.  First 15 patients are seen on a first come, first serve basis.    Medicaid-accepting [facility] Providers:  Organization          Address  Phone   Notes  [facility] [address], [place] [phone] Also accepts self-pay patients.  [facility] [address], [place]  [phone]) [phone]   [facility] [address], [place] [phone]) [phone]   [facility] [address] [place] [address], [place] [phone]) [phone]   Renaye RakersVeita Bland [address], [place]   [phone] Only accepts [place] Access [place] patients after they have their name applied to their card.   Self-Pay (no insurance) in [facility] [place]:  Organization         Address  Phone   Notes  Sickle Cell Patients, [facility] [address], [place] [phone]) [phone]   [facility] [address], [place] [phone]   [facility] [place]  [address], [place] [phone]   [facility]/Dr. Osei-Bonsu  [address] [place] [address], [place] or [phone], [place] [phone] Phone number for both [place] and [place] locations is the same.  [facility] [address], [place] [phone]) [phone]   [facility] [place] [address] [place] [address], [place] or [address] [phone]) [phone]) [phone]   [facility] [address], [place] [phone]) [phone], phone; [phone], fax Sees patients 1st and 3rd Saturday of every month.  Must not qualify for public or private insurance (i.e. Medicaid, Medicare, [place] Health Choice, Veterans' Benefits)  Household income should be no more than 200% of the poverty level The clinic cannot treat you if you are pregnant or think you are pregnant  Sexually transmitted diseases are not treated at the clinic.    Dental Care: Organization         Address  Phone  Notes  [facility] Department of [facility] [address], [place] [phone] Accepts children up to age 26 who are enrolled in [place] or [place] Health Choice; pregnant women with a Medicaid card; and children who have applied for Medicaid or [place] Health Choice, but were declined, whose parents can pay a reduced fee at time of service.  [facility] of [facility] [place]  [address], [place] [phone]) [phone] Accepts children up to age 26 who are enrolled in [place] or [place] Health Choice; pregnant women with a Medicaid card; and children who have applied for Medicaid or [place] Health Choice, but were declined, whose parents can pay a reduced fee at time of service.  [facility]  [address], [place] [phone] Patients are seen by appointment only. Walk-ins are not accepted. [facility] will see patients 18 years of  age and older. Monday - Tuesday (8am-5pm) Most Wednesdays (8:30-5pm) $30 per visit, cash only  [facility] Access PROGRAM  [address], [facility] [phone] Patients are seen by appointment only. Walk-ins are not accepted. [place] will see patients 57 years of age and older. One Wednesday Evening (Monthly: Volunteer Based).  $30 per visit, cash only  [place]  [phone] for adults; Children under age 78, call Graduate Pediatric Dentistry at [phone]. Children aged 43-14, please call [phone] to request a pediatric application.  Dental services are provided in all areas of dental care including fillings, crowns and bridges, complete and partial dentures, implants, gum treatment, root canals, and extractions. Preventive care is also provided. Treatment is provided to both adults and children. Patients are selected via a lottery and there is often a waiting list.   [facility] [address] [place]  [phone] [email or web address]   [facility] [address] [place], [place]  [phone], Ext. [phone] Second and Fourth Thursday of each month, opens at 6:30 AM; Clinic ends at 9 AM.  Patients are seen on a first-come first-served basis, and a limited number are seen during each clinic.   [facility]  [address] Hillard Danker [place], [place] [phone]   Eligibility Requirements You must have lived in [place], [place], or [place] counties for at least the last three months.   You cannot be eligible for state or federal sponsored [employer], including [employer], [place], or [employer].   You generally cannot be eligible for healthcare insurance through your employer.    How to apply: Eligibility screenings are held every Tuesday and Wednesday afternoon from 1:00 pm until 4:00 pm. You do not need an appointment for the interview!  [facility] [address] [place], [place]   [place]  [place] Department  [place]  [phone]    Behavioral Health Resources in the Community: Intensive Outpatient Programs Organization         Address  Phone  Notes  [place]. [address], [place], [place] [phone]   [facility]patient [address], [place], [place]   ADS: Alcohol & Drug Svcs [address] [place], [place]   [place] [address],  [place], [place] or [phone]   Substance Abuse Resources Organization         Address  Phone  Notes  Alcohol and Drug Services  [phone]   [place]  [phone]   The [place]   Chinita Pester  [phone]   Residential & Outpatient Substance Abuse Program  [phone]   Psychological Services Organization         Address  Phone  Notes  [facility] [place]  [place]  [phone]    [facility] [address] [place] or [phone]    Mobile Crisis Teams Organization         Address  Phone  Notes  [facility], Mobile Crisis Care Unit  [phone]   [facility] Psychotherapeutic Services  [address] [place], [place]   [facility] [address] [place] [phone]    Self-Help/Support Groups Organization  Address  Phone             Notes  [place] of [place] - variety of support groups  [place] Call for more information  Narcotics Anonymous (NA), [facility] [address], [employer] [place]  2 meetings at this location   Special educational needs teacher         Address  Phone  Notes  ASAP Residential Treatment [place],    [place]  [phone]   [facility]  [address], [place] [phone], [place], [place]   [place], [place] [phone] Admissions: 8am-3pm M-F  Incentives Substance [place] [address]    [place], [place] [phone]   [facility] [address] [place], [place], [place]   [facility] [address],  [place], [place]   [facility]ight Programs - Intensive Outpatient [address], [place], [place]   [facility] [place].,  [place], [place] [phone] or [phone]   [facility] (RTS) [address], [place], [place] Accepts Medicaid  [facility] [place] [address],  [place] [phone] Substance Abuse/Addiction Treatment   [facility] Organization         Address  Phone  Notes  [facility]  [phone]   Domenic Schwab, PhD [address]lis Porta [place], [place]   [phone] or [phone]   Tyler  [address] [place], [place] [phone]   [facility] [address], [place], [place] [phone] Insurance/Medicaid/sponsorship through [facility] and Families [address], [address]                                    [place], [place] [phone] [place] [address], [place] [phone]    Dr. Adele Schilder  [phone]   [facility] [place] Dept. 1) [address] [place] 2) [place] 3)  [place] [address], [place] [phone]  [phone]   [place] [phone] or [phone] (After Hours)

## 2014-11-17 NOTE — ED Provider Notes (Signed)
CSN: [id number]     Arrival date & time 11/17/14  0745 History   First MD Initiated Contact with Patient 11/17/14 [phone]     Chief Complaint  Patient presents with  . Mouth Lesions     (Consider location/radiation/quality/duration/timing/severity/associated sxs/prior Treatment) HPI Comments: The patient is a 24 year old male with a past medical history of chronic recurrent mouth lesions since 24 years old presents emergency room chief complaint of mouth lesions, worsening for 7 days. Patient reports he has [facility] infectious disease, [facility] but has not followed up due to financial difficulty as time. He reports he has been [facility] infectious disease [facility] for similar complaints.  He reports decreased oral intake to 2 pain. He denies fever, chills, abdominal pain, nausea, vomiting, diarrhea.  Reports similar symptoms with previous outbreaks. No treatment prior to arrival.  Patient is a 24 y.o. male presenting with mouth sores. The history is provided by the patient. No language interpreter was used.  Mouth Lesions Associated symptoms: no fever     Past Medical History  Diagnosis Date  . Behcet's disease   . MRSA (methicillin resistant staph aureus) culture positive   . Herpes   . Stomatitis and mucositis   . Recurrent mouth ulceration     "since 24 years old"   Past Surgical History  Procedure Laterality Date  . Biopsy pharynx     Family History  Problem Relation Age of Onset  . Heart disease Mother   . Lupus Neg Hx   . Crohn's disease Neg Hx    History  Substance Use Topics  . Smoking status: Current Every Day Smoker -- 0.25 packs/day    Types: Cigarettes  . Smokeless tobacco: Never Used  . Alcohol Use: Yes     Comment: occ    Review of Systems  Constitutional: Negative for fever and chills.  HENT: Positive for mouth sores.       Allergies  Review of patient's allergies indicates no known  allergies.  Home Medications   Prior to Admission medications   Medication Sig Start Date End Date Taking? Authorizing Provider  Alum & Mag Hydroxide-Simeth (MAGIC MOUTHWASH W/LIDOCAINE) SOLN Take 5 mLs by mouth 3 (three) times daily. Patient not taking: Reported on 09/01/2014 07/17/14   Renne CriglerJoshua Geiple, PA-C  Alum & Mag Hydroxide-Simeth (MAGIC MOUTHWASH W/LIDOCAINE) SOLN Take 5 mLs by mouth 3 (three) times daily as needed for mouth pain. 09/08/14   Harle BattiestElizabeth Tysinger, NP  HYDROcodone-acetaminophen (NORCO/VICODIN) 5-325 MG per tablet Take 2 tablets by mouth every 4 (four) hours as needed for moderate pain. Patient not taking: Reported on 09/01/2014 07/17/14   Renne CriglerJoshua Geiple, PA-C  HYDROcodone-acetaminophen (NORCO/VICODIN) 5-325 MG per tablet 1 or 2 tabs PO q6 hours prn pain Patient not taking: Reported on 09/08/2014 09/01/14   Samuel JesterKathleen McManus, DO  naproxen (NAPROSYN) 375 MG tablet Take 1 tablet (375 mg total) by mouth 2 (two) times daily. Patient not taking: Reported on 09/01/2014 05/25/14   April K Palumbo-Rasch, MD  nystatin (MYCOSTATIN) 100000 UNIT/ML suspension Take 5 mLs (500,000 Units total) by mouth 4 (four) times daily. Patient not taking: Reported on 09/01/2014 05/25/14   April K Palumbo-Rasch, MD  predniSONE (DELTASONE) 20 MG tablet 3 tabs po daily x 3 days, then 2 tabs x 3 days, then 1.5 tabs x 3 days, then 1 tab x 3 days, then 0.5 tabs x 3 days Patient not taking: Reported on 09/01/2014 07/17/14  Renne Crigler, PA-C  predniSONE (DELTASONE) 50 MG tablet Take one tablet by mouth daily 09/08/14   Harle Battiest, NP  sucralfate (CARAFATE) 1 GM/10ML suspension Take 10 mLs (1 g total) by mouth 4 (four) times daily -  with meals and at bedtime. 09/08/14   Harle Battiest, NP   BP 156/86 mmHg  Pulse 78  Temp(Src) 98.1 F (36.7 C) (Oral)  Resp 16  SpO2 98% Physical Exam  Constitutional: He is oriented to person, place, and time. He appears well-developed and well-nourished. No distress.   HENT:  Head: Normocephalic and atraumatic.  Mouth/Throat: Uvula is midline. Mucous membranes are not dry. Oral lesions present. No trismus in the jaw. No oropharyngeal exudate.  White lesions to tongue.  Eyes: EOM are normal.  Neck: Neck supple.  Pulmonary/Chest: Effort normal. No respiratory distress.  Abdominal: Soft. There is no tenderness. There is no rebound and no guarding.  Neurological: He is oriented to person, place, and time.  Skin: Skin is warm and dry.  Psychiatric: He has a normal mood and affect. His behavior is normal.  Nursing note and vitals reviewed.   ED Course  Procedures (including critical care time) Labs Review Labs Reviewed - No data to display  Imaging Review No results found.   EKG Interpretation None      MDM   Final diagnoses:  History of oral lesions   Patient with recurring oral lesions. Has been seen by Dr. Zenaida Niece dam in 2014 for similar complaints. [facility] Jenne Pane after biopsy. Plan to treat with penicillin, Diflucan, pain medication, Magic mouthwash, follow-up with established care.  Encourage fluids. Resources given. Discussed treatment plan with the patient. Return precautions given. Reports understanding and no other concerns at this time.  Patient is stable for discharge at this time.    Mellody Drown, PA-C 11/17/14 1610  Arby Barrette, MD 11/18/14 [phone]

## 2014-11-17 NOTE — ED Notes (Signed)
Pt c/o increasing mouth sores x 1 week.  Pain score 10/10.  Pt has not taken anything for pain.  Hx of Behcet's disease, herpes, and recurrent mouth ulceration.

## 2014-12-17 ENCOUNTER — Encounter (HOSPITAL_BASED_OUTPATIENT_CLINIC_OR_DEPARTMENT_OTHER): Payer: Self-pay | Admitting: *Deleted

## 2014-12-17 ENCOUNTER — Emergency Department (HOSPITAL_BASED_OUTPATIENT_CLINIC_OR_DEPARTMENT_OTHER)
Admission: EM | Admit: 2014-12-17 | Discharge: 2014-12-17 | Disposition: A | Discharge status: Home / Self Care | Payer: PRIVATE HEALTH INSURANCE | Attending: Emergency Medicine | Admitting: Emergency Medicine

## 2014-12-17 DIAGNOSIS — Z8739 Personal history of other diseases of the musculoskeletal system and connective tissue: Secondary | ICD-10-CM | POA: Insufficient documentation

## 2014-12-17 DIAGNOSIS — Z8619 Personal history of other infectious and parasitic diseases: Secondary | ICD-10-CM | POA: Insufficient documentation

## 2014-12-17 DIAGNOSIS — Z8614 Personal history of Methicillin resistant Staphylococcus aureus infection: Secondary | ICD-10-CM | POA: Insufficient documentation

## 2014-12-17 DIAGNOSIS — Z720 Tobacco use: Secondary | ICD-10-CM | POA: Insufficient documentation

## 2014-12-17 DIAGNOSIS — Z7952 Long term (current) use of systemic steroids: Secondary | ICD-10-CM | POA: Insufficient documentation

## 2014-12-17 DIAGNOSIS — K121 Other forms of stomatitis: Secondary | ICD-10-CM | POA: Insufficient documentation

## 2014-12-17 DIAGNOSIS — K1230 Oral mucositis (ulcerative), unspecified: Secondary | ICD-10-CM | POA: Insufficient documentation

## 2014-12-17 DIAGNOSIS — Z8719 Personal history of other diseases of the digestive system: Secondary | ICD-10-CM

## 2014-12-17 MED ORDER — PREDNISONE 20 MG PO TABS: 40.0000 mg | ORAL_TABLET | Freq: Every day | ORAL | Status: DC

## 2014-12-17 MED ORDER — MAGIC MOUTHWASH W/LIDOCAINE: 5.0000 mL | Freq: Three times a day (TID) | ORAL | Status: DC | PRN

## 2014-12-17 MED ORDER — PREDNISONE 10 MG PO TABS
60.0000 mg | ORAL_TABLET | Freq: Once | ORAL | Status: AC
  Administered 2014-12-17: 60 mg via ORAL
  Filled 2014-12-17 (×2): qty 1

## 2014-12-17 NOTE — ED Notes (Signed)
PA at bedside.

## 2014-12-17 NOTE — Discharge Instructions (Signed)
Please call your doctor for a followup appointment within 24-48 hours. When you talk to your doctor please let them know that you were seen in the emergency department and have them acquire all of your records so that they can discuss the findings with you and formulate a treatment plan to fully care for your new and ongoing problems. Please follow-up with your dermatologist and infectious disease physician  Please take medication as prescribed Please do not swallow Magic mouthwash - always spit out  Please continue to monitor symptoms closely and if symptoms are to worsen or change (fever greater than 101, chills, sweating, nausea, vomiting, chest pain, shortness of breathe, difficulty breathing, weakness, numbness, tingling, worsening or changes to pain pattern, bleeding, inability to swallow, rashes, neck pain or neck stiffness) please report back to the Emergency Department immediately.    Stomatitis Stomatitis is an inflammation of the mucous lining of the mouth. It can affect part of the mouth or the whole mouth. The intensity of symptoms can range from mild to severe. It can affect your cheek, teeth, gums, lips, or tongue. In almost all cases, the lining of the mouth becomes swollen, red, and painful. Painful ulcers can develop in your mouth. Stomatitis recurs in some people. CAUSES  There are many common causes of stomatitis. They include:  Viruses (such as cold sores or shingles).  Canker sores.  Bacteria (such as ulcerative gingivitis or sexually transmitted diseases).  Fungus or yeast (such as candidiasis or oral thrush).  Poor oral hygiene and poor nutrition (Vincent's stomatitis or trench mouth).  Lack of vitamin B, vitamin C, or niacin.  Dentures or braces that do not fit properly.  High acid foods (uncommon).  Sharp or broken teeth.  Cheek biting.  Breathing through the mouth.  Chewing tobacco.  Allergy to toothpaste, mouthwash, candy, gum, lipstick, or some  medicines.  Burning your mouth with hot drinks or food.  Exposure to dyes, heavy metals, acid fumes, or mineral dust. SYMPTOMS   Painful ulcers in the mouth.  Blisters in the mouth.  Bleeding gums.  Swollen gums.  Irritability.  Bad breath.  Bad taste in the mouth.  Fever.  Trouble eating because of burning and pain in the mouth. DIAGNOSIS  Your caregiver will examine your mouth and look for bleeding gums and mouth ulcers. Your caregiver may ask you about the medicines you are taking. Your caregiver may suggest a blood test and tissue sample (biopsy) of the mouth ulcer or mass if either is present. This will help find the cause of your condition. TREATMENT  Your treatment will depend on the cause of your condition. Your caregiver will first try to treat your symptoms.   You may be given pain medicine. Topical anesthetic may be used to numb the area if you have severe pain.  Your caregiver may prescribe antibiotic medicine if you have a bacterial infection.  Your caregiver may prescribe antifungal medicine if you have a fungal infection.  You may need to take antiviral medicine if you have a viral infection like herpes.  You may be asked to use medicated mouth rinses.  Your caregiver will advise you about proper brushing and using a soft toothbrush. You also need to get your teeth cleaned regularly. HOME CARE INSTRUCTIONS   Maintain good oral hygiene. This is especially important for transplant patients.  Brush your teeth carefully with a soft, nylon-bristled toothbrush.  Floss at least 2 times a day.  Clean your mouth after eating.  Rinse your  mouth with salt water 3 to 4 times a day.  Gargle with cold water.  Use topical numbing medicines to decrease pain if recommended by your caregiver.  Stop smoking, and stop using chewing or smokeless tobacco.  Avoid eating hot and spicy foods.  Eat soft and bland food.  Reduce your stress wherever possible.  Eat  healthy and nutritious foods. SEEK MEDICAL CARE IF:   Your symptoms persist or get worse.  You develop new symptoms.  Your mouth ulcers are present for more than 3 weeks.  Your mouth ulcers come back frequently.  You have increasing difficulty with normal eating and drinking.  You have increasing fatigue or weakness.  You develop loss of appetite or nausea. SEEK IMMEDIATE MEDICAL CARE IF:   You have a fever.  You develop pain, redness, or sores around one or both eyes.  You cannot eat or drink because of pain or other symptoms.  You develop worsening weakness, or you faint.  You develop vomiting or diarrhea.  You develop chest pain, shortness of breath, or rapid and irregular heartbeats. MAKE SURE YOU:  Understand these instructions.  Will watch your condition.  Will get help right away if you are not doing well or get worse. Document Released: 07/23/2007 Document Revised: 12/18/2011 Document Reviewed: 05/04/2011 [facility] Patient Information 2015 [place], [place]. This information is not intended to replace advice given to you by your health care provider. Make sure you discuss any questions you have with your health care provider.   Emergency Department Resource Guide 1) Find a Doctor and Pay Out of Pocket Although you won't have to find out who is covered by your insurance plan, it is a good idea to ask around and get recommendations. You will then need to call the office and see if the doctor you have chosen will accept you as a new patient and what types of options they offer for patients who are self-pay. Some doctors offer discounts or will set up payment plans for their patients who do not have insurance, but you will need to ask so you aren't surprised when you get to your appointment.  2) Contact Your Local Health Department Not all health departments have doctors that can see patients for sick visits, but many do, so it is worth a call to see if yours does. If  you don't know where your local health department is, you can check in your phone book. The CDC also has a tool to help you locate your state's health department, and many state websites also have listings of all of their local health departments.  3) Find a Walk-in Clinic If your illness is not likely to be very severe or complicated, you may want to try a walk in clinic. These are popping up all over the country in pharmacies, drugstores, and shopping centers. They're usually staffed by nurse practitioners or physician assistants that have been trained to treat common illnesses and complaints. They're usually fairly quick and inexpensive. However, if you have serious medical issues or chronic medical problems, these are probably not your best option.  No Primary Care Doctor: - Call Health Connect at  [phone] - they can help you locate a primary care doctor that  accepts your insurance, provides certain services, etc. - Physician Referral Service- [phone]  Chronic Pain Problems: Organization         Address  Phone   Notes  [facility]  [phone] Patients need to be referred by  their primary care doctor.   Medication Assistance: Organization         Address  Phone   Notes  [facility] [place] Medication [facility] [address] [place], [place] [address] [phone]) [phone] --Must be a resident of [place] [place] -- Must have NO insurance coverage whatsoever (no Medicaid/ Medicare, etc.) -- The pt. MUST have a primary care doctor that directs their care regularly and follows them in the community   Med[facility]ist  [phone]   [employer]  [phone]    Agencies that provide inexpensive medical care: Organization         Address  Phone   Notes  [facility]  [phone]   [facility]    [phone](336) [phone]   [facility] [address] [place], [place] [address] [phone]) [phone]   [facility] [place] [address], [place] [phone](336) [phone]   Planned Parenthood    [phone](336) [phone]   [facility]    [phone]) [phone]   [facility]  [address], [place] Phone:  [phone](336) [phone], Fax:  [phone] Hours of Operation:  9 am - 6 pm, M-F.  Also accepts Medicaid/Medicare and self-pay.  [facility]  [address], [place] Phone: [phone], Fax: [phone](336) [phone]. Hours of Operation:  8:30 am - 5:30 pm, M-F.  Also accepts Medicaid and self-pay.  [facility] [place] [address], [place] Phone: [phone](336) [phone]   [facility] [address], [place], [place] [phone], Ext. [phone] Mondays & Thursdays: 7-9 AM.  First 15 patients are seen on a first come, first serve basis.    Medicaid-accepting [facility] Providers:  Organization         Address  Phone   Notes  [facility] [address], [place] [phone]) [phone] Also accepts self-pay patients.  [facility] [address], [place]  [phone](336) [phone]   [facility] [address], [place] [phone]) [phone]   [facility] [address] [place] [address], [place] [phone]) [phone]   Renaye RakersVeita Bland [address], [place]   [phone] Only accepts [place] Access [place] patients after they have their name applied to their card.   Self-Pay (no insurance) in [facility] [place]:  Organization         Address  Phone   Notes  Sickle Cell Patients, [facility] [address], [place] [phone]) [phone]   [facility] [address], [place] [phone]) [phone]   [facility] [place]  [address], [place] [phone](336) [phone]   [facility]/Dr. Osei-Bonsu  [address] [place] [address], [place] or [phone], [place] [phone]) [phone]  Phone number for both [place] and [place] locations is the same.  [facility] [address], [place] [phone]) [phone]   [facility]  [address] [place] [address], [place] or [address] [phone](336) [phone]) [phone]   [facility] [address], [place] [phone]) [phone], phone; [phone]) [phone], fax Sees patients 1st and 3rd Saturday of every month.  Must not qualify for public or private insurance (i.e. Medicaid, Medicare, [place] Health Choice, Veterans' Benefits)  Household income should be no more than  200% of the poverty level The clinic cannot treat you if you are pregnant or think you are pregnant  Sexually transmitted diseases are not treated at the clinic.    Dental Care: Organization         Address  Phone  Notes  [facility] of [facility] [address] [place], [place] [phone] Accepts children up to age 94 who are enrolled in [place] or [place] Health Choice; pregnant women with a Medicaid card; and children who have applied for Medicaid or [place] Health Choice, but were declined, whose parents can pay a reduced fee at time of service.  [facility] Department of [facility]  [address], [place] [phone] Accepts children up to age 70 who are enrolled in [place] or [place] Health Choice; pregnant women with a Medicaid card; and children who have applied for Medicaid or [place] Health Choice, but were declined, whose parents can pay a reduced fee at time of service.  [facility]  [address] [place], [place] [phone] Patients are seen by appointment only. Walk-ins are not accepted. [facility] will see patients 27 years of age and older. Monday - Tuesday (8am-5pm) Most Wednesdays (8:30-5pm) $30 per visit, cash only  [facility]  [address], [facility] [phone] Patients are seen by appointment  only. Walk-ins are not accepted. [facility] will see patients 48 years of age and older. One Wednesday Evening (Monthly: Volunteer Based).  $30 per visit, cash only  [employer]  [phone] for adults; Children under age 35, call Graduate Pediatric Dentistry at [phone]. Children aged 80-14, please call [phone] to request a pediatric application.  Dental services are provided in all areas of dental care including fillings, crowns and bridges, complete and partial dentures, implants, gum treatment, root canals, and extractions. Preventive care is also provided. Treatment is provided to both adults and children. Patients are selected via a lottery and there is often a waiting list.   [facility] [address] [place]  [phone] [email or web address]   [facility] [address] [place], [place] [phone], Ext. [phone] Second and Fourth Thursday of each month, opens at 6:30 AM; Clinic ends at 9 AM.  Patients are seen on a first-come first-served basis, and a limited number are seen during each clinic.   [facility]  [address] [place], [place] [phone]   Eligibility Requirements You must have lived in [place], [place], or [place] counties for at least the last three months.   You cannot be eligible for state or federal sponsored [place], including [employer], [place], or [employer].   You generally cannot be eligible for healthcare insurance through your employer.    How to apply: Eligibility screenings are held every Tuesday and Wednesday afternoon from 1:00 pm until 4:00 pm. You do not need an appointment for the interview!  [facility] [address] [place], [place] [phone]   [facility] Health Department  [phone]   [facility] Health Department  [phone]   [facility] Health Department  [phone]    Behavioral Health  Resources in the Community: Intensive Outpatient Programs Organization         Address  Phone  Notes  [facility] [address], [place], [place] [phone]   [facility] Behavioral Health  Outpatient [address], [place], [place] [phone]   [facility] [address], [place], [place]  [phone]   [facility] Mental Health [address]  [place], [place] [phone] or [phone]   Substance Abuse Resources Organization         Address  Phone  Notes  Alcohol [facility] Drug Services  [phone]   [facility]  [phone]   [facility] [place]  [phone]   Floydene Flock  [phone]   Residential & Outpatient Substance Abuse Program  [phone]   Psychological Services Organization         Address  Phone  Notes  [facility]  [phone]   [facility] Services  [phone]   [facility] Mental Health [address], [place] [phone] or [phone]    Mobile Crisis Teams Organization         Address  Phone  Notes  [facility], Mobile Crisis Care Unit  [phone]   [facility]  [address] [place], [place] [phone]   Doristine Locks [address] [place] [phone]    Self-Help/Support Groups Organization         Address  Phone             Notes  Mental Health Assoc. of  - variety of support groups  336- I7437963 Call for more information  Narcotics Anonymous (NA), [facility] [address], [employer] [place]  2 meetings at this location   Statistician         Address  Phone  Notes  [facility] [address],    [place]  [phone]   [facility]  [address] [place] [address], [place], [place] [phone]   [facility] [address] [place], [place] [phone] Admissions: 8am-3pm M-F  [facility] Substance Abuse  Treatment Center [address],    [place], [place] [phone]   [facility] [address] [place], [place], [place] [phone]   [facility] [address]  [place], [place] [phone]   [facility] - Intensive Outpatient [address], [place], [place] [phone]   [facility] ([facility] [address]  [place], [place] [phone] or [phone]   [facility] ([facility]) [address], [place], [place] [phone] Accepts Medicaid  [facility] [address],  [place] [phone] Substance Abuse/Addiction Treatment   [facility] Organization         Address  Phone  Notes  [facility]  [phone]   Angie Fava, PhD [address] Kn[address] [place], [place]   [phone] or [phone]   [facility] Behavioral   [address] [place], [place] [phone]   [facility] [address] [place], [place] [phone] Insurance/Medicaid/sponsorship through [facility] and Families [address]                                    [place], [place] [phone] Therapy/tele-psych/case  [facility] [address], [place] [phone]    Dr. Lolly Mustache  [phone]   [facility] [place]  [employer] [facility] Dept. 1) [address], [place] 2) [address] [place] 3)  [address] [place] [address], [place] [phone] °([phone]) [phone] ° °([phone]) [phone]   °[place] Child Abuse Hotline (336) 342-1394 or (336) [phone] (After Hours)    ° ° ° °

## 2014-12-17 NOTE — ED Provider Notes (Signed)
CSN: [id number]     Arrival date & time 12/17/14  1205 History   First MD Initiated Contact with Patient 12/17/14 1214     Chief Complaint  Patient presents with  . Mouth Lesions     (Consider location/radiation/quality/duration/timing/severity/associated sxs/prior Treatment) The history is provided by the patient. No language interpreter was used.  Paul House is a 24 y/o M with PMHx of Behcet's disease, MRSA, Herpes, stomatitis presenting to the ED with oral lesions that started in the end of February 2016. Patient reported that he has been dealing with these ulcers since he was 24 years old. Reported that he had a biopsy performed in 2014 that diagnosed as Behcet's. Reported that the lesions usually start on the tongue and then work their way to the buccal mucosa. Patient reported that there is not pain, reported that they are a little uncomfortable. Reported that he has been having left eye redness that is associated with these symptoms - denied pain. When asked if patient has followed up with specialist he declined - stated that they continue to cancel on him. Denied drainage, bleeding, difficulty swallowing, sore throat, pain with swallowing, chest pain, shortness of breath, difficulty breathing, rashes, fever, neck pain, neck stiffness, blurred vision, sudden loss of vision. PCP none  Past Medical History  Diagnosis Date  . Behcet's disease   . MRSA (methicillin resistant staph aureus) culture positive   . Herpes   . Stomatitis and mucositis   . Recurrent mouth ulceration     "since 24 years old"   Past Surgical History  Procedure Laterality Date  . Biopsy pharynx     Family History  Problem Relation Age of Onset  . Heart disease Mother   . Lupus Neg Hx   . Crohn's disease Neg Hx    History  Substance Use Topics  . Smoking status: Current Every Day Smoker -- 0.25 packs/day    Types: Cigarettes  . Smokeless tobacco: Never Used  . Alcohol Use: Yes     Comment:  occ    Review of Systems  Constitutional: Negative for fever and chills.  HENT: Positive for mouth sores. Negative for sore throat and trouble swallowing.   Eyes: Negative for photophobia, pain, discharge, redness, itching and visual disturbance.  Respiratory: Negative for chest tightness and shortness of breath.   Cardiovascular: Negative for chest pain.  Musculoskeletal: Negative for neck pain and neck stiffness.  Skin: Negative for rash.  Neurological: Negative for headaches.      Allergies  Review of patient's allergies indicates no known allergies.  Home Medications   Prior to Admission medications   Medication Sig Start Date End Date Taking? Authorizing Provider  Alum & Mag Hydroxide-Simeth (MAGIC MOUTHWASH W/LIDOCAINE) SOLN Take 5 mLs by mouth 3 (three) times daily as needed for mouth pain. 12/17/14   Jessyka Austria, PA-C  HYDROcodone-acetaminophen (HYCET) 7.5-325 mg/15 ml solution Take 10 mLs by mouth every 6 (six) hours as needed for moderate pain or severe pain. 11/17/14   Mellody Drown, PA-C  predniSONE (DELTASONE) 20 MG tablet Take 2 tablets (40 mg total) by mouth daily. Take 40 mg by mouth daily for 3 days, then  by mouth daily for 3 days, then  daily for 3 days 12/17/14   Buel Molder, PA-C   BP 123/90 mmHg  Pulse 98  Temp(Src) 97.8 F (36.6 C) (Oral)  Resp 18  Ht 6' (1.829 m)  Wt 155 lb (70.308 kg)  BMI 21.02 kg/m2  SpO2 100% Physical Exam  Constitutional: He is oriented to person, place, and time. He appears well-developed and well-nourished. No distress.  HENT:  Head: Normocephalic and atraumatic.  Mouth/Throat: Oral lesions present. No trismus in the jaw. No uvula swelling. No oropharyngeal exudate, posterior oropharyngeal edema, posterior oropharyngeal erythema or tonsillar abscesses.  White lesions noted on tongue. Ulcerations identified to the buccal mucosa bilaterally.  Eyes: EOM are normal. Pupils are equal, round, and reactive to light. Right  eye exhibits no discharge and no exudate. No foreign body present in the right eye. Left eye exhibits no discharge and no exudate. No foreign body present in the left eye. Right conjunctiva is not injected. Right conjunctiva has no hemorrhage. Left conjunctiva is injected. Left conjunctiva has no hemorrhage. Right eye exhibits normal extraocular motion and no nystagmus. Left eye exhibits normal extraocular motion and no nystagmus.  Neck: Normal range of motion. Neck supple. No tracheal deviation present.  Negative neck stiffness Negative nuchal rigidity Negative cervical lymphadenopathy Negative meningeal signs  Cardiovascular: Normal rate, regular rhythm and normal heart sounds.  Exam reveals no friction rub.   No murmur heard. Pulmonary/Chest: Effort normal and breath sounds normal. No respiratory distress. He has no wheezes. He has no rales.  Patient is able to speak in full sentences without difficulty Negative use of accessory muscles Negative stridor  Musculoskeletal: Normal range of motion.  Lymphadenopathy:    He has no cervical adenopathy.  Neurological: He is alert and oriented to person, place, and time. No cranial nerve deficit. He exhibits normal muscle tone. Coordination normal. GCS eye subscore is 4. GCS verbal subscore is 5. GCS motor subscore is 6.  Skin: Skin is warm and dry. No rash noted. He is not diaphoretic. No erythema.  Psychiatric: He has a normal mood and affect. His behavior is normal. Thought content normal.  Nursing note and vitals reviewed.   ED Course  Procedures (including critical care time) Labs Review Labs Reviewed - No data to display  Imaging Review No results found.   EKG Interpretation None      MDM   Final diagnoses:  History of oral lesions  Stomatitis and mucositis    Medications  predniSONE (DELTASONE) tablet 60 mg (60 mg Oral Given 12/17/14 1317)    Filed Vitals:   12/17/14 1210  BP: 123/90  Pulse: 98  Temp: 97.8 F (36.6  C)  TempSrc: Oral  Resp: 18  Height: 6' (1.829 m)  Weight: 155 lb (70.308 kg)  SpO2: 100%   This provider reviewed the patient's chart. Patient was seen and assessed in ED setting on 11/17/2014 regarding recurrent mouth lesions that is been ongoing since the patient was [age] years old. Patient was discharged with Magic mouthwash, prednisone and Diflucan. Patient [facility]. Patient has had numerous testing-positive for herpes, negative for C3/C4 and a day. Patient has been seen by ophthalmology and was negative for uveitis. Patient had a [facility] in 2014 was positive for Behcet's. Patient has not followed up with ID or dermatology since December 2015 secondary to continuous rescheduling and not getting a phone call back from physician as per patient. Patient presented to the ED with mouth ulcers identified to the bucca mucosa bilaterally as well as white lesions noted on the tongue. Negative signs of respiratory distress. Patient is able to speak in full sentences. Patient stable, afebrile. Patient not septic appearing. Discharged patient. Discharge patient with prednisone and Magic mouthwash. Referred patient to  his ID and dermatologist. Discussed with patient to rest and stay hydrated. Discussed with patient to closely monitor symptoms and if symptoms are to worsen or change to report back to the ED - strict return instructions given.  Patient agreed to plan of care, understood, all questions answered.   Raymon Mutton, PA-C 12/17/14 1342  Shon Baton, MD 12/17/14 [phone]

## 2014-12-17 NOTE — ED Notes (Signed)
Mouth ulcers. States he has Behcets disease. Recent treatment for ulcers. They went away but came back once treatment was complete.

## 2014-12-25 IMAGING — CT CT NECK W/ CM
2 of 3 series · 8 of 14 positions shown, 9 images · IV contrast (omnipaque)
Comparison: None.

CLINICAL DATA: Sore throat, worsening.

EXAM:
CT NECK WITH CONTRAST
TECHNIQUE: Multidetector CT imaging of the neck was performed using the
standard protocol following the bolus administration of intravenous
contrast.
CONTRAST:  100mL OMNIPAQUE IOHEXOL 300 MG/ML  SOLN

[Series 3: neck with st · axial · 0.43mm/px · z∈[-258,-104]mm · 4 of 129 slices shown]
[im 26/129  bone]
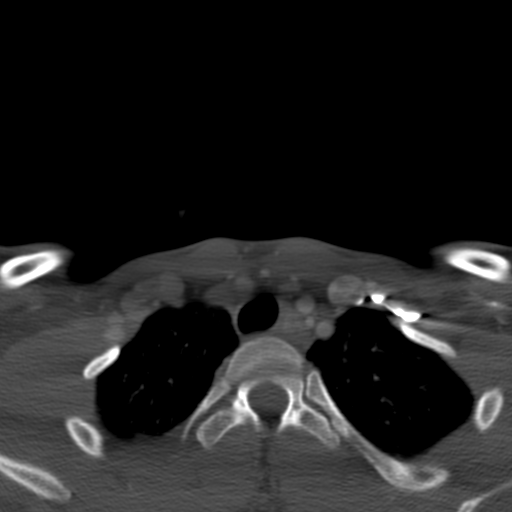
[im 52/129  bone]
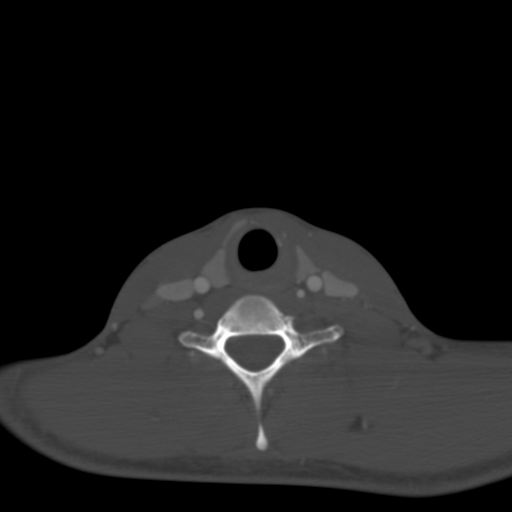
[im 77/129  bone]
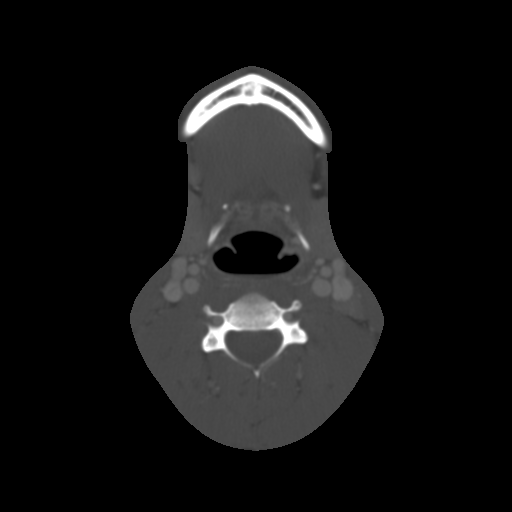
[im 103/129  bone]
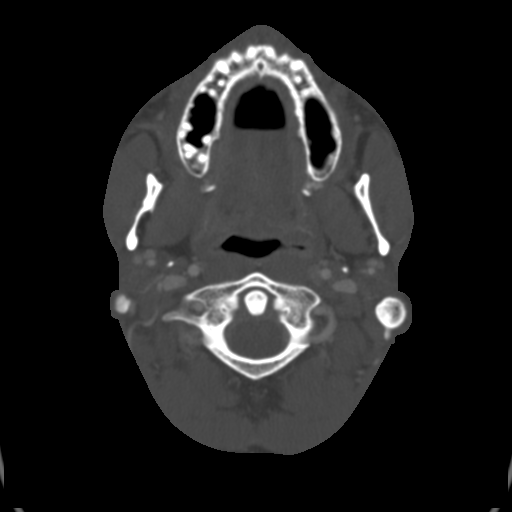

[Series 8: axial recons · axial · 0.39mm/px · z∈[-277,-125]mm · 4 of 129 slices shown, 5 images]
[im 26/129  soft-tissue]
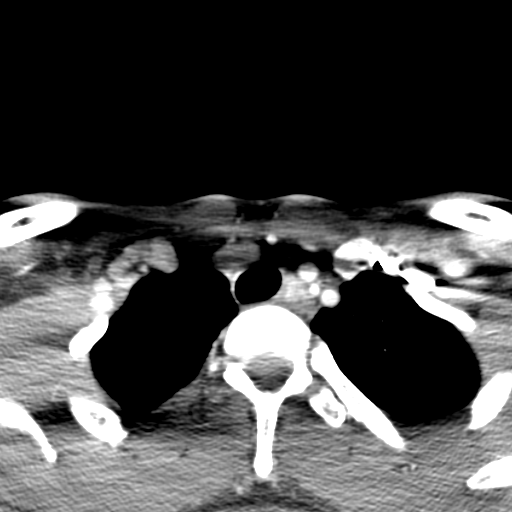
[im 26/129  bone]
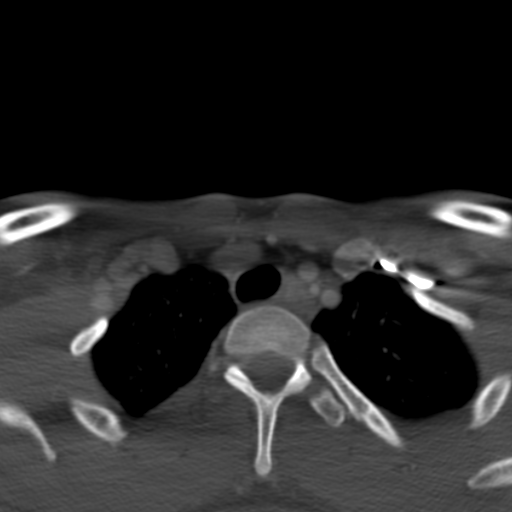
[im 52/129  bone]
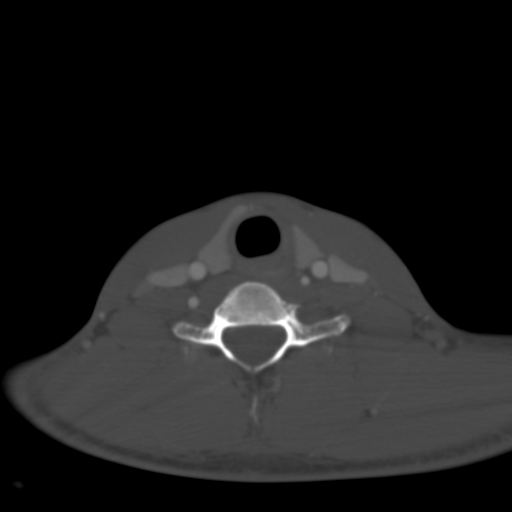
[im 77/129  bone]
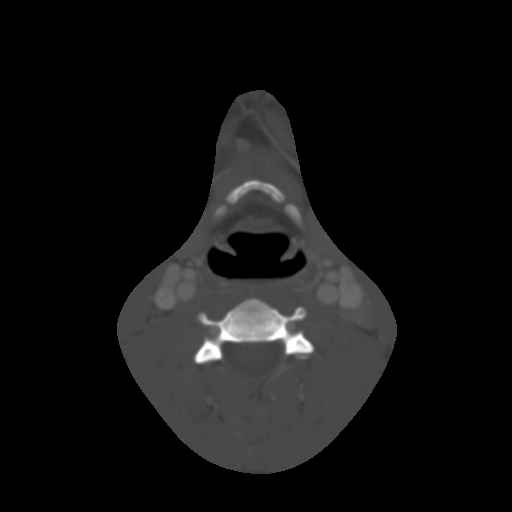
[im 103/129  bone]
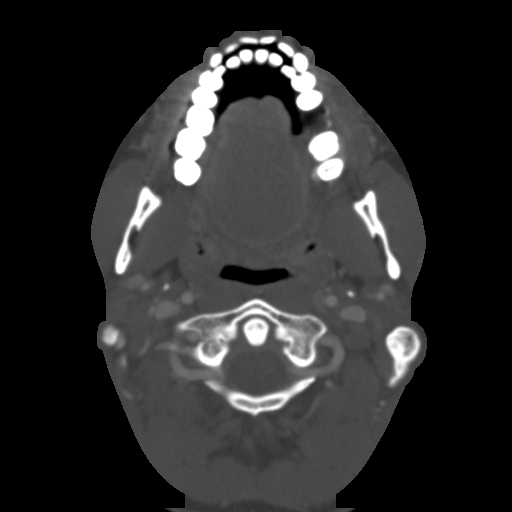

[8 of 14 positions shown; findings below may reference images not displayed]

FINDINGS: Epiglottis and aryepiglottic folds are normal. Airways patent. Mild
prominence and enhancement of the tonsils suggesting the possibility
of tonsillitis. Recommend clinical correlation. No focal fluid
collection to suggest tonsillar or peritonsillar abscess. There are
mildly prominent bilateral cervical and submandibular lymph nodes,
presumably reactive.

Thyroid, submandibular and parotid glands are symmetric and
unremarkable. Vascular structures widely patent. Visualized upper
lung fields are clear.

No acute bony abnormality.
IMPRESSION: Mildly prominent and enhancing tonsils suggesting tonsillitis. No
evidence of tonsillar abscess. Epiglottis and aryepiglottic folds
are normal.

## 2015-02-02 ENCOUNTER — Encounter (HOSPITAL_COMMUNITY): Payer: Self-pay | Admitting: *Deleted

## 2015-02-02 ENCOUNTER — Emergency Department (HOSPITAL_COMMUNITY)
Admission: EM | Admit: 2015-02-02 | Discharge: 2015-02-02 | Disposition: A | Discharge status: Home / Self Care | Payer: PRIVATE HEALTH INSURANCE | Attending: Emergency Medicine | Admitting: Emergency Medicine

## 2015-02-02 DIAGNOSIS — Z720 Tobacco use: Secondary | ICD-10-CM | POA: Insufficient documentation

## 2015-02-02 DIAGNOSIS — K1230 Oral mucositis (ulcerative), unspecified: Secondary | ICD-10-CM | POA: Insufficient documentation

## 2015-02-02 DIAGNOSIS — Z8619 Personal history of other infectious and parasitic diseases: Secondary | ICD-10-CM | POA: Insufficient documentation

## 2015-02-02 DIAGNOSIS — Z7952 Long term (current) use of systemic steroids: Secondary | ICD-10-CM | POA: Insufficient documentation

## 2015-02-02 DIAGNOSIS — Z8739 Personal history of other diseases of the musculoskeletal system and connective tissue: Secondary | ICD-10-CM | POA: Insufficient documentation

## 2015-02-02 DIAGNOSIS — K121 Other forms of stomatitis: Secondary | ICD-10-CM

## 2015-02-02 MED ORDER — HYDROCODONE-ACETAMINOPHEN 7.5-325 MG/15ML PO SOLN: 10.0000 mL | ORAL | Status: DC

## 2015-02-02 MED ORDER — MAGIC MOUTHWASH W/LIDOCAINE
5.0000 mL | Freq: Three times a day (TID) | ORAL | Status: DC | PRN
  Administered 2015-02-02: 5 mL via ORAL
  Filled 2015-02-02: qty 5

## 2015-02-02 MED ORDER — MAGIC MOUTHWASH W/LIDOCAINE: 5.0000 mL | Freq: Three times a day (TID) | ORAL | Status: DC | PRN

## 2015-02-02 MED ORDER — PREDNISONE 20 MG PO TABS
60.0000 mg | ORAL_TABLET | Freq: Once | ORAL | Status: AC
  Administered 2015-02-02: 60 mg via ORAL
  Filled 2015-02-02: qty 3

## 2015-02-02 MED ORDER — HYDROCODONE-ACETAMINOPHEN 7.5-325 MG/15ML PO SOLN: 10.0000 mL | ORAL | Status: DC | PRN

## 2015-02-02 MED ORDER — PREDNISONE 20 MG PO TABS: ORAL_TABLET | ORAL | Status: DC

## 2015-02-02 NOTE — ED Provider Notes (Signed)
CSN: [id number]     Arrival date & time 02/02/15  1918 History  This chart was scribed for non-physician practitioner, Earley Favor, NP, working with Mancel Bale, MD, by Modena Jansky, ED Scribe. This patient was seen in room WTR7/WTR7 and the patient's care was started at 7:59 PM.   Chief Complaint  Patient presents with  . Mouth Lesions   The history is provided by the patient. No language interpreter was used.   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 He reports that he has been having oral lesions and white patches to his tongue. He states no modifying factors or treatment PTA. He reports that he has a prior hx of oral lesion treated by steroids in the past. He denies any fever.   Past Medical History  Diagnosis Date  . Behcet's disease   . MRSA (methicillin resistant staph aureus) culture positive   . Herpes   . Stomatitis and mucositis   . Recurrent mouth ulceration     "since 24 years old"   Past Surgical History  Procedure Laterality Date  . Biopsy pharynx     Family History  Problem Relation Age of Onset  . Heart disease Mother   . Lupus Neg Hx   . Crohn's disease Neg Hx    History  Substance Use Topics  . Smoking status: Current Every Day Smoker -- 0.25 packs/day    Types: Cigarettes  . Smokeless tobacco: Never Used  . Alcohol Use: Yes     Comment: occ    Review of Systems  Constitutional: Negative for fever.  HENT: Positive for mouth sores. Negative for sore throat and trouble swallowing.   All other systems reviewed and are negative.   Allergies  Review of patient's allergies indicates no known allergies.  Home Medications   Prior to Admission medications   Medication Sig Start Date End Date Taking? Authorizing Provider  Alum & Mag Hydroxide-Simeth (MAGIC MOUTHWASH W/LIDOCAINE) SOLN Take 5  mLs by mouth 3 (three) times daily as needed for mouth pain. 12/17/14   Marissa Sciacca, PA-C  HYDROcodone-acetaminophen (HYCET) 7.5-325 mg/15 ml solution Take 10 mLs by mouth every 6 (six) hours as needed for moderate pain or severe pain. 11/17/14   Mellody Drown, PA-C  predniSONE (DELTASONE) 20 MG tablet Take 2 tablets (40 mg total) by mouth daily. Take 40 mg by mouth daily for 3 days, then  by mouth daily for 3 days, then  daily for 3 days 12/17/14   Marissa Sciacca, PA-C   BP 116/92 mmHg  Pulse 91  Temp(Src) 98.3 F (36.8 C) (Oral)  Resp 18  SpO2 100% Physical Exam  Constitutional: He is oriented to person, place, and time. He appears well-developed and well-nourished. No distress.  HENT:  Head: Normocephalic and atraumatic.  Severe stomatitis and oral ulcers. Tongue beefy red and swollen gum inflamed and purulent   Neck: Neck supple. No tracheal deviation present.  Cardiovascular: Normal rate and regular rhythm.   Pulmonary/Chest: Effort normal. No respiratory distress.  Musculoskeletal: Normal range of motion.  Lymphadenopathy:    He has cervical adenopathy.  Neurological: He is alert and oriented to person, place, and time.  Skin: Skin is warm and dry.  Psychiatric: He has a normal mood and affect. His behavior is normal.  Nursing note and vitals reviewed.   ED Course  Procedures (  including critical care time) DIAGNOSTIC STUDIES: Oxygen Saturation is 100% on RA, normal by my interpretation.    COORDINATION OF CARE: 8:03 PM- Pt advised of plan for treatment which includes medication and pt agrees.  Labs Review Labs Reviewed - No data to display  Imaging Review No results found.   EKG Interpretation None     Encouraged patient to [facility]  MDM   Final diagnoses:  None   I personally performed the services described in this documentation, which was scribed in my presence. The recorded information has been reviewed and is  accurate.    Earley FavorGail Larry Knipp, NP 02/02/15 2015  Mancel BaleElliott Wentz, MD 02/04/15 [phone]

## 2015-02-02 NOTE — ED Notes (Signed)
Pt reports mouth sores and white patches to tongue and oral cavity x2 weeks. Pt denies any fever or body aches.

## 2015-02-02 NOTE — Discharge Instructions (Signed)
Stomatitis °Stomatitis is an inflammation of the mucous lining of the mouth. It can affect part of the mouth or the whole mouth. The intensity of symptoms can range from mild to severe. It can affect your cheek, teeth, gums, lips, or tongue. In almost all cases, the lining of the mouth becomes swollen, red, and painful. Painful ulcers can develop in your mouth. Stomatitis recurs in some people. °CAUSES  °There are many common causes of stomatitis. They include: °· Viruses (such as cold sores or shingles). °· Canker sores. °· Bacteria (such as ulcerative gingivitis or sexually transmitted diseases). °· Fungus or yeast (such as candidiasis or oral thrush). °· Poor oral hygiene and poor nutrition (Vincent's stomatitis or trench mouth). °· Lack of vitamin B, vitamin C, or niacin. °· Dentures or braces that do not fit properly. °· High acid foods (uncommon). °· Sharp or broken teeth. °· Cheek biting. °· Breathing through the mouth. °· Chewing tobacco. °· Allergy to toothpaste, mouthwash, candy, gum, lipstick, or some medicines. °· Burning your mouth with hot drinks or food. °· Exposure to dyes, heavy metals, acid fumes, or mineral dust. °SYMPTOMS  °· Painful ulcers in the mouth. °· Blisters in the mouth. °· Bleeding gums. °· Swollen gums. °· Irritability. °· Bad breath. °· Bad taste in the mouth. °· Fever. °· Trouble eating because of burning and pain in the mouth. °DIAGNOSIS  °Your caregiver will examine your mouth and look for bleeding gums and mouth ulcers. Your caregiver may ask you about the medicines you are taking. Your caregiver may suggest a blood test and tissue sample (biopsy) of the mouth ulcer or mass if either is present. This will help find the cause of your condition. °TREATMENT  °Your treatment will depend on the cause of your condition. Your caregiver will first try to treat your symptoms.  °· You may be given pain medicine. Topical anesthetic may be used to numb the area if you have severe  pain. °· Your caregiver may prescribe antibiotic medicine if you have a bacterial infection. °· Your caregiver may prescribe antifungal medicine if you have a fungal infection. °· You may need to take antiviral medicine if you have a viral infection like herpes. °· You may be asked to use medicated mouth rinses. °· Your caregiver will advise you about proper brushing and using a soft toothbrush. You also need to get your teeth cleaned regularly. °HOME CARE INSTRUCTIONS  °· Maintain good oral hygiene. This is especially important for transplant patients. °· Brush your teeth carefully with a soft, nylon-bristled toothbrush. °· Floss at least 2 times a day. °· Clean your mouth after eating. °· Rinse your mouth with salt water 3 to 4 times a day. °· Gargle with cold water. °· Use topical numbing medicines to decrease pain if recommended by your caregiver. °· Stop smoking, and stop using chewing or smokeless tobacco. °· Avoid eating hot and spicy foods. °· Eat soft and bland food. °· Reduce your stress wherever possible. °· Eat healthy and nutritious foods. °SEEK MEDICAL CARE IF:  °· Your symptoms persist or get worse. °· You develop new symptoms. °· Your mouth ulcers are present for more than 3 weeks. °· Your mouth ulcers come back frequently. °· You have increasing difficulty with normal eating and drinking. °· You have increasing fatigue or weakness. °· You develop loss of appetite or nausea. °SEEK IMMEDIATE MEDICAL CARE IF:  °· You have a fever. °· You develop pain, redness, or sores around one or both   eyes.  You cannot eat or drink because of pain or other symptoms.  You develop worsening weakness, or you faint.  You develop vomiting or diarrhea.  You develop chest pain, shortness of breath, or rapid and irregular heartbeats. MAKE SURE YOU:  Understand these instructions.  Will watch your condition.  Will get help right away if you are not doing well or get worse. Document Released: 07/23/2007  Document Revised: 12/18/2011 Document Reviewed: 05/04/2011 [facility] Patient Information 2015 [place]. This information is not intended to replace advice given to you by your health care provider. Make sure you discuss any questions you have with your health care provider.  Oral Mucositis Oral mucositis is a mouth condition that may develop from treatment used to cure cancer. With this condition, sores may appear on your lips, gums, tongue,and the roof or floor of your mouth. CAUSES  Oral mucositis can happen to anyone who is being treated with cancer therapies, including:  Cancer drugs (chemotherapy).  Radiation (X-ray or other high-energy rays) for head or neck cancer.  Bone marrow transplants and stem cell transplants. Oralmucositis is not caused by infection. However, the sores can become infected after they form. Infection can make oral mucositis worse. The following factors increase your risk of oral mucositis:  Poor oral hygiene.  Dental problems or oral diseases.  Smoking.  Chewing tobacco.  Drinking alcohol.  Having other diseases such as diabetes, human immunodeficiency virus (HIV), acquired immunodeficiency syndrome (AIDS), or kidney disease.  Not drinking enough water.  Having dentures that do not fit right.  Being a child. Children are more likely than adults to develop oral mucositis, but children usually heal more quickly.  Being elderly. Elderly adults are more likely to develop oral mucositis. SYMPTOMS  Symptoms vary. They may be mild or severe. Symptoms usually show up 7 to 10 days after starting treatment. Symptoms may include:   Sores in the mouth that bleed.  Color changes inside the mouth. Red, shiny areas appear.  White patches or pus in the mouth.  Pain in the mouth and throat.  Pain when talking.  Dryness and a burning feeling in the mouth.  Saliva that is dry and thick.  Trouble eating, drinking, and swallowing.  Weight loss  and malnutrition. This happens because eating is a problem. DIAGNOSIS  A caregiver will check your mouth. Then, the condition is given a grade. This grading system will help your caregiver treat your condition:  Grade 1: The inside of the mouth is sore and red.  Grade 2: There is redness in the mouth. Open sores are present. Swallowing food might be uncomfortable.  Grade 3: There are open sores. The mouth is very red. It is very hard to swallow food.  Grade 4: No food or drink can be swallowed. TREATMENT  Oral mucositis usually heals on its own. Sometimes, changes in the cancer treatment can help. Keeping the mouth as clean and germ free as possible is very important.  Medicine may ease the condition. Different types of medicine may be needed, such as:  An antibiotic to fight infection, if present.  Medicine to help mucosal cells heal more quickly.  A water-based moisturizer for your lips, if they are affected.  Methods to control pain may include:  Keeping your mouth moist. You may suck on ice chips or sugar-free frozen ice pops.  Pain relievers that are swished around in the mouth. They will make the mouth numb to ease the pain (topical anesthetics).  Specific mouth  rinses.  Prescribed, medicated gels. The gel coats the mouth. This protects nerve endings and lowers pain.  Narcotic pain medicines. These are strong drugs. They may be used if pain is very bad.  Mouth care can keep the mouth as healthy as possible and help to prevent infection. Mouth care includes:  A dental checkup. Your dental caregiver will make sure you have no teeth problems that could cause infection. Try to have the dental checkup before you begin your treatment for cancer.  Brushing your teeth several times a day. Use a soft toothbrush. Change to a new brush often. Use only gentle toothpastes. Ask your caregiver what product would be best for you. Make sure that you also floss your teeth.  Rinsing your  mouth after every meal. Rinse again at bedtime. Do not use mouthwash that contains alcohol. Ask your caregiver what would be best for you. HOME CARE INSTRUCTIONS  Only take over-the-counter or prescription medicines for pain, discomfort, or fever as directed by your caregiver. Follow the directions carefully.  Do not smoke.  Do not drink alcohol.  Eat only bland, soft foods until your mouth sores heal. Avoid sugary and acidic foods and drinks.  Ask your caregiver if you should add high-protein shakes to your diet to avoid malnutrition and weight loss.  Drink enough fluids to keep your urine clear or pale yellow.  If you have dentures, take them out often.  Continue to check your mouth every day for any signs of oral mucositis.  Keep all follow-up appointments. SEEK MEDICAL CARE IF:  You notice redness, soreness, or dryness in your mouth.  You have mouth or throat pain that makes it hard to swallow or speak. SEEK IMMEDIATE MEDICAL CARE IF:  Your pain in your mouth or throat gets worse and does not improve with pain medicine.  You have a lot of bleeding in your mouth.  You develop new, open sores in your mouth.  You notice patches of pus forming in your mouth.  You cannot swallow solid food or liquids.  You have a fever. Document Released: 05/12/2011 Document Revised: 12/18/2011 Document Reviewed: 05/12/2011 [facility] Patient Information 2015 [place], [place]. This information is not intended to replace advice given to you by your health care provider. Make sure you discuss any questions you have with your health care provider. Please call the community wellness center to set an appointment for regular medical care

## 2015-03-09 ENCOUNTER — Encounter (HOSPITAL_COMMUNITY): Payer: Self-pay | Admitting: Emergency Medicine

## 2015-03-09 ENCOUNTER — Emergency Department (HOSPITAL_COMMUNITY)
Admission: EM | Admit: 2015-03-09 | Discharge: 2015-03-09 | Disposition: A | Discharge status: Home / Self Care | Payer: PRIVATE HEALTH INSURANCE | Attending: Emergency Medicine | Admitting: Emergency Medicine

## 2015-03-09 DIAGNOSIS — K1370 Unspecified lesions of oral mucosa: Secondary | ICD-10-CM

## 2015-03-09 DIAGNOSIS — K1379 Other lesions of oral mucosa: Secondary | ICD-10-CM | POA: Insufficient documentation

## 2015-03-09 DIAGNOSIS — Z8619 Personal history of other infectious and parasitic diseases: Secondary | ICD-10-CM | POA: Insufficient documentation

## 2015-03-09 DIAGNOSIS — Z8739 Personal history of other diseases of the musculoskeletal system and connective tissue: Secondary | ICD-10-CM | POA: Insufficient documentation

## 2015-03-09 DIAGNOSIS — Z720 Tobacco use: Secondary | ICD-10-CM | POA: Insufficient documentation

## 2015-03-09 MED ORDER — PREDNISONE 20 MG PO TABS: 40.0000 mg | ORAL_TABLET | Freq: Every day | ORAL | Status: DC

## 2015-03-09 NOTE — Progress Notes (Signed)
Genifer Lazenby J. Clydene Laming, RN, [place], [place] [phone] ED CM consulted to meet with patient concerning f/u care with PCP and patient does not have insurance. Pt presented to [facility] ED today with recurrent mouth lesions.  Met with patient at bedside, confirmed informaton. Pt  reports not having access to f/u care with PCP, or insurance coverage. Discussed with patient importance and benefits of  establishing PCP, and not utilizing the ED for primary care needs. Pt verbalized understanding and is in agreement. Discussed other options, provided list of local  affordable PCPs.  Pt voiced interest in the [facility] and [place].  [facility] Brochure given with address, phone number, and the services highlighted. Explained that there is a Customer service manager on site who will assist with [employer] and process. Instructed to call or walk in Monday - Friday between the hours of 9- 5:30p to schedule appt for establishing care for PCP. Pt verbalized understanding.   NCM also provided information for [facility] [place]

## 2015-03-09 NOTE — Discharge Instructions (Signed)
Take prednisone as directed until gone.

## 2015-03-09 NOTE — ED Notes (Addendum)
Pt reports he has "Bechet's disease" which causes his mouth sores. States is usually treated with Prednisone.

## 2015-03-09 NOTE — ED Notes (Signed)
Pt is in stable condition upon d/c and ambulates from ED. 

## 2015-03-09 NOTE — ED Provider Notes (Signed)
CSN: [id number]     Arrival date & time 03/09/15  1126 History  This chart was scribed for non-physician practitioner, Emilia BeckKaitlyn Arien Morine, PA-C, working with Rolland PorterMark James, MD, by Lionel DecemberHatice Demirci, ED Scribe. This patient was seen in room TR06C/TR06C and the patient's care was started at 12:05 PM.    Chief Complaint  Patient presents with  . Mouth Lesions     (Consider location/radiation/quality/duration/timing/severity/associated sxs/prior Treatment) Patient is a 24 y.o. male presenting with mouth sores. The history is provided by the patient. No language interpreter was used.  Mouth Lesions   HPI Comments: Paul ReachChristopher House is a 24 y.o. male who presents to the Emergency Department complaining of mouth lesions which are caused by his history of Bechet's disease.  Patient is here for prednisone and states that it usually alleviates his outbreaks.  Patient states that his outbreaks occur about every 2 months.  He has no other concerns today.    Past Medical History  Diagnosis Date  . Behcet's disease   . MRSA (methicillin resistant staph aureus) culture positive   . Herpes   . Stomatitis and mucositis   . Recurrent mouth ulceration     "since 24 years old"   Past Surgical History  Procedure Laterality Date  . Biopsy pharynx     Family History  Problem Relation Age of Onset  . Heart disease Mother   . Lupus Neg Hx   . Crohn's disease Neg Hx    History  Substance Use Topics  . Smoking status: Current Every Day Smoker -- 0.25 packs/day    Types: Cigarettes  . Smokeless tobacco: Never Used  . Alcohol Use: No     Comment: occ    Review of Systems  HENT: Positive for mouth sores.   All other systems reviewed and are negative.     Allergies  Review of patient's allergies indicates no known allergies.  Home Medications   Prior to Admission medications   Medication Sig Start Date End Date Taking? Authorizing Provider  Alum & Mag Hydroxide-Simeth (MAGIC MOUTHWASH  W/LIDOCAINE) SOLN Take 5 mLs by mouth 3 (three) times daily as needed for mouth pain. 02/02/15   Earley FavorGail Schulz, NP  HYDROcodone-acetaminophen (HYCET) 7.5-325 mg/15 ml solution Take 10 mLs by mouth every 4 (four) hours as needed for moderate pain. 02/02/15   Earley FavorGail Schulz, NP  predniSONE (DELTASONE) 20 MG tablet 3 Tabs PO Days 1-3, then 2 tabs PO Days 4-6, then 1 tab PO Day 7-9, then Half Tab PO Day 10-12 02/02/15   Earley FavorGail Schulz, NP   BP 124/90 mmHg  Pulse 102  Temp(Src) 98.5 F (36.9 C) (Oral)  Resp 17  SpO2 96% Physical Exam  Constitutional: He is oriented to person, place, and time. He appears well-developed and well-nourished. No distress.  HENT:  Head: Normocephalic and atraumatic.  Ulcerative lesions of lateral aspects of tongue with surrounding erythema.   Eyes: Conjunctivae and EOM are normal.  Neck: Neck supple. No tracheal deviation present.  Cardiovascular: Normal rate.   Pulmonary/Chest: Effort normal. No respiratory distress.  Abdominal: Soft. He exhibits no distension. There is no tenderness. There is no rebound.  Musculoskeletal: Normal range of motion.  Neurological: He is alert and oriented to person, place, and time.  Skin: Skin is warm and dry.  Psychiatric: He has a normal mood and affect. His behavior is normal.  Nursing note and vitals reviewed.   ED Course  Procedures (including critical care time) DIAGNOSTIC STUDIES: Oxygen Saturation is 96% on RA,  adequate by my interpretation.    COORDINATION OF CARE: 12:08 PM Discussed treatment plan with patient at beside, the patient agrees with the plan and has no further questions at this time.   Labs Review Labs Reviewed - No data to display  Imaging Review No results found.   EKG Interpretation None      MDM   Final diagnoses:  Mouth lesion   Patient will have prednisone for mouth lesions. Patient was seen by Case Management for PCP follow up.   I personally performed the services described in this  documentation, which was scribed in my presence. The recorded information has been reviewed and is accurate.    Emilia Beck, PA-C 03/09/15 1331  Rolland Porter, MD 03/23/15 [phone]

## 2015-03-26 ENCOUNTER — Emergency Department (HOSPITAL_COMMUNITY)
Admission: EM | Admit: 2015-03-26 | Discharge: 2015-03-26 | Disposition: A | Discharge status: Home / Self Care | Payer: PRIVATE HEALTH INSURANCE | Attending: Emergency Medicine | Admitting: Emergency Medicine

## 2015-03-26 ENCOUNTER — Encounter (HOSPITAL_COMMUNITY): Payer: Self-pay

## 2015-03-26 DIAGNOSIS — Z8614 Personal history of Methicillin resistant Staphylococcus aureus infection: Secondary | ICD-10-CM | POA: Insufficient documentation

## 2015-03-26 DIAGNOSIS — Z8619 Personal history of other infectious and parasitic diseases: Secondary | ICD-10-CM | POA: Insufficient documentation

## 2015-03-26 DIAGNOSIS — K1379 Other lesions of oral mucosa: Secondary | ICD-10-CM | POA: Insufficient documentation

## 2015-03-26 DIAGNOSIS — Z8739 Personal history of other diseases of the musculoskeletal system and connective tissue: Secondary | ICD-10-CM | POA: Insufficient documentation

## 2015-03-26 DIAGNOSIS — Z720 Tobacco use: Secondary | ICD-10-CM | POA: Insufficient documentation

## 2015-03-26 MED ORDER — PREDNISONE 10 MG PO TABS: 10.0000 mg | ORAL_TABLET | Freq: Every day | ORAL | Status: DC

## 2015-03-26 NOTE — ED Notes (Signed)
Pt comfortable with discharge and follow up instructions. Pt declines wheelchair, escorted to waiting area by this RN. Prescriptions x1. 

## 2015-03-26 NOTE — Discharge Instructions (Signed)
Please call your doctor for a followup appointment within 24-48 hours. When you talk to your doctor please let them know that you were seen in the emergency department and have them acquire all of your records so that they can discuss the findings with you and formulate a treatment plan to fully care for your new and ongoing problems. ° °

## 2015-03-26 NOTE — ED Notes (Signed)
Pt reports having "Bechet's disease" and normally takes prednisone to relieve symptoms. Symptoms include mouth sores/swelling. Worsened over the last 3 days. No difficulty breathing/swallowing. Difficult to eat because of swelling.

## 2015-03-26 NOTE — ED Provider Notes (Signed)
CSN: [id number]     Arrival date & time 03/26/15  6759 History   First MD Initiated Contact with Patient 03/26/15 0801     Chief Complaint  Patient presents with  . Mouth Lesions     (Consider location/radiation/quality/duration/timing/severity/associated sxs/prior Treatment) HPI Comments: The patient is a 24 year old male, he has a history of Behcet's disease, has been on prednisone multiple times because of recurrent mouth lesions, presents to the hospital today because of recurrent lesions, they have been there for several days, they tend to get better with prednisone, when he stops prednisone the source come back. He denies any other symptoms. The symptoms are persistent, gradually worsening, not associated with vomiting, he is able to swallow pills  Patient is a 24 y.o. male presenting with mouth sores. The history is provided by the patient.  Mouth Lesions Associated symptoms: no fever     Past Medical History  Diagnosis Date  . Behcet's disease   . MRSA (methicillin resistant staph aureus) culture positive   . Herpes   . Stomatitis and mucositis   . Recurrent mouth ulceration     "since 24 years old"   Past Surgical History  Procedure Laterality Date  . Biopsy pharynx     Family History  Problem Relation Age of Onset  . Heart disease Mother   . Lupus Neg Hx   . Crohn's disease Neg Hx    History  Substance Use Topics  . Smoking status: Current Every Day Smoker -- 0.25 packs/day    Types: Cigarettes  . Smokeless tobacco: Never Used  . Alcohol Use: No     Comment: occ    Review of Systems  Constitutional: Negative for fever.  HENT: Positive for mouth sores.       Allergies  Review of patient's allergies indicates no known allergies.  Home Medications   Prior to Admission medications   Medication Sig Start Date End Date Taking? Authorizing Provider  Alum & Mag Hydroxide-Simeth (MAGIC MOUTHWASH W/LIDOCAINE) SOLN Take 5 mLs by mouth 3 (three) times daily as  needed for mouth pain. 02/02/15   Earley Favor, NP  HYDROcodone-acetaminophen (HYCET) 7.5-325 mg/15 ml solution Take 10 mLs by mouth every 4 (four) hours as needed for moderate pain. 02/02/15   Earley Favor, NP  predniSONE (DELTASONE) 10 MG tablet Take 1 tablet (10 mg total) by mouth daily. 03/26/15   Eber Hong, MD   BP 142/73 mmHg  Pulse 88  Temp(Src) 98.1 F (36.7 C) (Oral)  Resp 18  SpO2 99% Physical Exam  Constitutional: He appears well-developed and well-nourished.  HENT:  Head: Normocephalic and atraumatic.  Multiple shallow ulcers on the lateral and inferior aspects of the tongue  Eyes: Conjunctivae are normal. Right eye exhibits no discharge. Left eye exhibits no discharge.  Cardiovascular: Normal rate.   No murmur heard. Pulmonary/Chest: Effort normal. No respiratory distress.  Neurological: He is alert. Coordination normal.  Skin: Skin is warm and dry. No rash noted. He is not diaphoretic. No erythema.  Psychiatric: He has a normal mood and affect.  Nursing note and vitals reviewed.   ED Course  Procedures (including critical care time) Labs Review Labs Reviewed - No data to display  Imaging Review No results found.    MDM   Final diagnoses:  Mouth sores    The patient has normal vital signs, he has no lymphadenopathy of the head or the neck, he has multiple sores in the mouth, would benefit from low-dose prednisone, will continue this  until he sees his family doctor in one week, he is set up at the [facility] for 1 week from today. The patient is in agreement with the plan.  Meds given in ED:  Medications - No data to display  New Prescriptions   PREDNISONE (DELTASONE) 10 MG TABLET    Take 1 tablet (10 mg total) by mouth daily.        Eber Hong, MD 03/26/15 [phone]

## 2015-04-02 ENCOUNTER — Ambulatory Visit: Payer: PRIVATE HEALTH INSURANCE

## 2015-04-03 ENCOUNTER — Emergency Department (HOSPITAL_COMMUNITY)
Admission: EM | Admit: 2015-04-03 | Discharge: 2015-04-03 | Disposition: A | Discharge status: Home / Self Care | Payer: PRIVATE HEALTH INSURANCE | Attending: Emergency Medicine | Admitting: Emergency Medicine

## 2015-04-03 ENCOUNTER — Encounter (HOSPITAL_COMMUNITY): Payer: Self-pay | Admitting: *Deleted

## 2015-04-03 DIAGNOSIS — Z8614 Personal history of Methicillin resistant Staphylococcus aureus infection: Secondary | ICD-10-CM | POA: Insufficient documentation

## 2015-04-03 DIAGNOSIS — K1379 Other lesions of oral mucosa: Secondary | ICD-10-CM | POA: Insufficient documentation

## 2015-04-03 DIAGNOSIS — Z720 Tobacco use: Secondary | ICD-10-CM | POA: Insufficient documentation

## 2015-04-03 DIAGNOSIS — Z7952 Long term (current) use of systemic steroids: Secondary | ICD-10-CM | POA: Insufficient documentation

## 2015-04-03 DIAGNOSIS — K1370 Unspecified lesions of oral mucosa: Secondary | ICD-10-CM

## 2015-04-03 DIAGNOSIS — Z8739 Personal history of other diseases of the musculoskeletal system and connective tissue: Secondary | ICD-10-CM | POA: Insufficient documentation

## 2015-04-03 DIAGNOSIS — Z8619 Personal history of other infectious and parasitic diseases: Secondary | ICD-10-CM | POA: Insufficient documentation

## 2015-04-03 MED ORDER — OXYCODONE HCL 5 MG/5ML PO SOLN
5.0000 mg | Freq: Once | ORAL | Status: AC
  Administered 2015-04-03: 5 mg via ORAL

## 2015-04-03 MED ORDER — OXYCODONE HCL 5 MG/5ML PO SOLN: 5.0000 mg | Freq: Four times a day (QID) | ORAL | Status: DC | PRN

## 2015-04-03 MED ORDER — PREDNISONE 20 MG PO TABS
40.0000 mg | ORAL_TABLET | Freq: Once | ORAL | Status: AC
  Administered 2015-04-03: 40 mg via ORAL
  Filled 2015-04-03: qty 2

## 2015-04-03 MED ORDER — PREDNISONE 20 MG PO TABS: ORAL_TABLET | ORAL | Status: DC

## 2015-04-03 MED ORDER — NYSTATIN 100000 UNIT/ML MT SUSP: 500000.0000 [IU] | Freq: Four times a day (QID) | OROMUCOSAL | Status: DC

## 2015-04-03 NOTE — ED Notes (Signed)
Pt is here with mouth lesions, thick white patchy area on tongue, throat discomfort for a couple of weeks.

## 2015-04-03 NOTE — ED Provider Notes (Addendum)
CSN: [id number]     Arrival date & time 04/03/15  1349 History   First MD Initiated Contact with Patient 04/03/15 1458     Chief Complaint  Patient presents with  . Thrush  . Sore Throat     (Consider location/radiation/quality/duration/timing/severity/associated sxs/prior Treatment) HPI Comments: Should hear with mouth lesions. History of recurrent mouth lesions. He was seen 8 days ago for similar. He is on prednisone quite often for these mouth lesions. He is currently out of the medicine. He is put on 10 mg a day which helped. Patient has mouth pain but no difficulty swallowing. He is able to eat small amounts.  He denies fever, vomiting, diarrhea. Denies pain with swallowing  Patient is a 24 y.o. male presenting with pharyngitis. The history is provided by the patient.  Sore Throat This is a chronic problem. The current episode started 2 days ago. The problem occurs constantly. The problem has not changed since onset.Pertinent negatives include no chest pain, no abdominal pain and no shortness of breath. Nothing aggravates the symptoms. Nothing relieves the symptoms. Treatments tried: steroids, pain medicine.    Past Medical History  Diagnosis Date  . Behcet's disease   . MRSA (methicillin resistant staph aureus) culture positive   . Herpes   . Stomatitis and mucositis   . Recurrent mouth ulceration     "since 24 years old"   Past Surgical History  Procedure Laterality Date  . Biopsy pharynx     Family History  Problem Relation Age of Onset  . Heart disease Mother   . Lupus Neg Hx   . Crohn's disease Neg Hx    History  Substance Use Topics  . Smoking status: Current Every Day Smoker -- 0.25 packs/day    Types: Cigarettes  . Smokeless tobacco: Never Used  . Alcohol Use: No     Comment: occ    Review of Systems  Constitutional: Negative for fever.  HENT: Positive for mouth sores (recurrent).   Respiratory: Negative for cough and shortness of breath.    Cardiovascular: Negative for chest pain and leg swelling.  Gastrointestinal: Negative for vomiting and abdominal pain.  All other systems reviewed and are negative.     Allergies  Review of patient's allergies indicates no known allergies.  Home Medications   Prior to Admission medications   Medication Sig Start Date End Date Taking? Authorizing Provider  Alum & Mag Hydroxide-Simeth (MAGIC MOUTHWASH W/LIDOCAINE) SOLN Take 5 mLs by mouth 3 (three) times daily as needed for mouth pain. 02/02/15   Earley Favor, NP  HYDROcodone-acetaminophen (HYCET) 7.5-325 mg/15 ml solution Take 10 mLs by mouth every 4 (four) hours as needed for moderate pain. 02/02/15   Earley Favor, NP  predniSONE (DELTASONE) 10 MG tablet Take 1 tablet (10 mg total) by mouth daily. 03/26/15   Eber Hong, MD   BP 124/83 mmHg  Pulse 85  Temp(Src) 98.2 F (36.8 C) (Oral)  Resp 18  Ht 6' (1.829 m)  Wt 155 lb (70.308 kg)  BMI 21.02 kg/m2  SpO2 100% Physical Exam  Constitutional: He is oriented to person, place, and time. He appears well-developed and well-nourished. No distress.  HENT:  Head: Normocephalic and atraumatic.  Mouth/Throat: No oropharyngeal exudate, posterior oropharyngeal edema, posterior oropharyngeal erythema or tonsillar abscesses.  Eyes: EOM are normal. Pupils are equal, round, and reactive to light.  Neck: Normal range of motion. Neck supple.  Cardiovascular: Normal rate and regular rhythm.  Exam reveals no friction rub.   No  murmur heard. Pulmonary/Chest: Effort normal and breath sounds normal. No respiratory distress. He has no wheezes. He has no rales.  Abdominal: He exhibits no distension. There is no tenderness. There is no rebound.  Musculoskeletal: Normal range of motion. He exhibits no edema.  Neurological: He is alert and oriented to person, place, and time.  Skin: He is not diaphoretic.    ED Course  Procedures (including critical care time) Labs Review Labs Reviewed - No data to  display  Imaging Review No results found.   EKG Interpretation None      MDM   Final diagnoses:  Lesion of mouth    24 rolled male here with mouth lesions that are recurrent. Will place on sterilely therapy, liquid pain medicine. Also given some nystatin swish and swallow therapy. I offered IVF, but he refused.    Elwin Mocha, MD 04/03/15 1519  Elwin Mocha, MD 04/03/15 1520

## 2015-04-03 NOTE — ED Notes (Signed)
Pt is A&Ox4 with a steady gait.  Pt denies any pain at this time and is ready to be discharged home.

## 2015-05-09 ENCOUNTER — Emergency Department (HOSPITAL_COMMUNITY)
Admission: EM | Admit: 2015-05-09 | Discharge: 2015-05-09 | Disposition: A | Discharge status: Home / Self Care | Payer: PRIVATE HEALTH INSURANCE | Attending: Emergency Medicine | Admitting: Emergency Medicine

## 2015-05-09 ENCOUNTER — Encounter (HOSPITAL_COMMUNITY): Payer: Self-pay | Admitting: Emergency Medicine

## 2015-05-09 DIAGNOSIS — K121 Other forms of stomatitis: Secondary | ICD-10-CM

## 2015-05-09 DIAGNOSIS — Z7952 Long term (current) use of systemic steroids: Secondary | ICD-10-CM | POA: Insufficient documentation

## 2015-05-09 DIAGNOSIS — Z720 Tobacco use: Secondary | ICD-10-CM | POA: Insufficient documentation

## 2015-05-09 DIAGNOSIS — Z8619 Personal history of other infectious and parasitic diseases: Secondary | ICD-10-CM | POA: Insufficient documentation

## 2015-05-09 DIAGNOSIS — K1230 Oral mucositis (ulcerative), unspecified: Secondary | ICD-10-CM | POA: Insufficient documentation

## 2015-05-09 DIAGNOSIS — Z8614 Personal history of Methicillin resistant Staphylococcus aureus infection: Secondary | ICD-10-CM | POA: Insufficient documentation

## 2015-05-09 DIAGNOSIS — Z8739 Personal history of other diseases of the musculoskeletal system and connective tissue: Secondary | ICD-10-CM | POA: Insufficient documentation

## 2015-05-09 DIAGNOSIS — Z79899 Other long term (current) drug therapy: Secondary | ICD-10-CM | POA: Insufficient documentation

## 2015-05-09 MED ORDER — PREDNISONE 10 MG PO TABS: ORAL_TABLET | ORAL | Status: DC

## 2015-05-09 MED ORDER — HYDROCODONE-ACETAMINOPHEN 7.5-325 MG/15ML PO SOLN
10.0000 mL | Freq: Once | ORAL | Status: AC
  Administered 2015-05-09: 10 mL via ORAL
  Filled 2015-05-09: qty 15

## 2015-05-09 MED ORDER — DEXAMETHASONE SODIUM PHOSPHATE 10 MG/ML IJ SOLN
10.0000 mg | Freq: Once | INTRAMUSCULAR | Status: AC
  Administered 2015-05-09: 10 mg via INTRAMUSCULAR
  Filled 2015-05-09: qty 1

## 2015-05-09 MED ORDER — HYDROCODONE-ACETAMINOPHEN 7.5-325 MG/15ML PO SOLN: 10.0000 mL | Freq: Four times a day (QID) | ORAL | Status: DC | PRN

## 2015-05-09 NOTE — Discharge Instructions (Signed)
Take prednisone as prescribed until all gone starting tomrrow. Take hycet for pain as prescribed as needed. Follow up with [facility]   Oral Mucositis Oral mucositis is a mouth condition that may develop from treatment used to cure cancer. With this condition, sores may appear on your lips, gums, tongue,and the roof or floor of your mouth. CAUSES  Oral mucositis can happen to anyone who is being treated with cancer therapies, including:  Cancer drugs (chemotherapy).  Radiation (X-ray or other high-energy rays) for head or neck cancer.  Bone marrow transplants and stem cell transplants. Oralmucositis is not caused by infection. However, the sores can become infected after they form. Infection can make oral mucositis worse. The following factors increase your risk of oral mucositis:  Poor oral hygiene.  Dental problems or oral diseases.  Smoking.  Chewing tobacco.  Drinking alcohol.  Having other diseases such as diabetes, human immunodeficiency virus (HIV), acquired immunodeficiency syndrome (AIDS), or kidney disease.  Not drinking enough water.  Having dentures that do not fit right.  Being a child. Children are more likely than adults to develop oral mucositis, but children usually heal more quickly.  Being elderly. Elderly adults are more likely to develop oral mucositis. SYMPTOMS  Symptoms vary. They may be mild or severe. Symptoms usually show up 7 to 10 days after starting treatment. Symptoms may include:   Sores in the mouth that bleed.  Color changes inside the mouth. Red, shiny areas appear.  White patches or pus in the mouth.  Pain in the mouth and throat.  Pain when talking.  Dryness and a burning feeling in the mouth.  Saliva that is dry and thick.  Trouble eating, drinking, and swallowing.  Weight loss and malnutrition. This happens because eating is a problem. DIAGNOSIS  A caregiver will check your mouth. Then, the condition is given  a grade. This grading system will help your caregiver treat your condition:  Grade 1: The inside of the mouth is sore and red.  Grade 2: There is redness in the mouth. Open sores are present. Swallowing food might be uncomfortable.  Grade 3: There are open sores. The mouth is very red. It is very hard to swallow food.  Grade 4: No food or drink can be swallowed. TREATMENT  Oral mucositis usually heals on its own. Sometimes, changes in the cancer treatment can help. Keeping the mouth as clean and germ free as possible is very important.  Medicine may ease the condition. Different types of medicine may be needed, such as:  An antibiotic to fight infection, if present.  Medicine to help mucosal cells heal more quickly.  A water-based moisturizer for your lips, if they are affected.  Methods to control pain may include:  Keeping your mouth moist. You may suck on ice chips or sugar-free frozen ice pops.  Pain relievers that are swished around in the mouth. They will make the mouth numb to ease the pain (topical anesthetics).  Specific mouth rinses.  Prescribed, medicated gels. The gel coats the mouth. This protects nerve endings and lowers pain.  Narcotic pain medicines. These are strong drugs. They may be used if pain is very bad.  Mouth care can keep the mouth as healthy as possible and help to prevent infection. Mouth care includes:  A dental checkup. Your dental caregiver will make sure you have no teeth problems that could cause infection. Try to have the dental checkup before you begin your treatment for cancer.  Brushing your  teeth several times a day. Use a soft toothbrush. Change to a new brush often. Use only gentle toothpastes. Ask your caregiver what product would be best for you. Make sure that you also floss your teeth.  Rinsing your mouth after every meal. Rinse again at bedtime. Do not use mouthwash that contains alcohol. Ask your caregiver what would be best for  you. HOME CARE INSTRUCTIONS  Only take over-the-counter or prescription medicines for pain, discomfort, or fever as directed by your caregiver. Follow the directions carefully.  Do not smoke.  Do not drink alcohol.  Eat only bland, soft foods until your mouth sores heal. Avoid sugary and acidic foods and drinks.  Ask your caregiver if you should add high-protein shakes to your diet to avoid malnutrition and weight loss.  Drink enough fluids to keep your urine clear or pale yellow.  If you have dentures, take them out often.  Continue to check your mouth every day for any signs of oral mucositis.  Keep all follow-up appointments. SEEK MEDICAL CARE IF:  You notice redness, soreness, or dryness in your mouth.  You have mouth or throat pain that makes it hard to swallow or speak. SEEK IMMEDIATE MEDICAL CARE IF:  Your pain in your mouth or throat gets worse and does not improve with pain medicine.  You have a lot of bleeding in your mouth.  You develop new, open sores in your mouth.  You notice patches of pus forming in your mouth.  You cannot swallow solid food or liquids.  You have a fever. Document Released: 05/12/2011 Document Revised: 12/18/2011 Document Reviewed: 05/12/2011 [facility] Patient Information 2015 [place], [place]. This information is not intended to replace advice given to you by your health care provider. Make sure you discuss any questions you have with your health care provider.

## 2015-05-09 NOTE — ED Provider Notes (Signed)
CSN: [id number]     Arrival date & time 05/09/15  1407 History  This chart was scribed for non-physician practitioner, Lottie Mussel, PA-C, working with Nelva Nay, MD, by Ronney Lion, ED Scribe. This patient was seen in room WTR5/WTR5 and the patient's care was started at 2:30 PM.    Chief Complaint  Patient presents with  . Facial Swelling   The history is provided by the patient and a parent. No language interpreter was used.    HPI Comments: Paul House is a 24 y.o. male with a history of Behcet's disease and recurrent mouth ulceration, who presents to the Emergency Department complaining of constant tongue swelling and pain that began 4 days ago. Patient states this is typical of his usual Behcet's exacerbations; his mom states he is usually treated with pain medication and steroids, which is usually effective. Patient has tried Pharmacist, community Mouthwash in the past but his mom states he doesn't like it due to the taste. His mom adds that patient's Behcet's exacerbations have become more frequent and worsening lately. She states he has been seen at [facility] but this but otherwise has had no outpatient follow-up due to insurance issues. Patient denies difficulty breathing, fever, or chills.    Past Medical History  Diagnosis Date  . Behcet's disease   . MRSA (methicillin resistant staph aureus) culture positive   . Herpes   . Stomatitis and mucositis   . Recurrent mouth ulceration     "since 24 years old"   Past Surgical History  Procedure Laterality Date  . Biopsy pharynx     Family History  Problem Relation Age of Onset  . Heart disease Mother   . Lupus Neg Hx   . Crohn's disease Neg Hx    History  Substance Use Topics  . Smoking status: Current Every Day Smoker -- 0.25 packs/day    Types: Cigarettes  . Smokeless tobacco: Never Used  . Alcohol Use: No     Comment: occ    Review of Systems  Constitutional: Negative for fever and chills.  HENT:  Positive for facial swelling. Negative for trouble swallowing.   Respiratory: Negative for shortness of breath.       Allergies  Review of patient's allergies indicates no known allergies.  Home Medications   Prior to Admission medications   Medication Sig Start Date End Date Taking? Authorizing Provider  Alum & Mag Hydroxide-Simeth (MAGIC MOUTHWASH W/LIDOCAINE) SOLN Take 5 mLs by mouth 3 (three) times daily as needed for mouth pain. 02/02/15   Earley Favor, NP  HYDROcodone-acetaminophen (HYCET) 7.5-325 mg/15 ml solution Take 10 mLs by mouth every 4 (four) hours as needed for moderate pain. 02/02/15   Earley Favor, NP  nystatin (MYCOSTATIN) 100000 UNIT/ML suspension Take 5 mLs (500,000 Units total) by mouth 4 (four) times daily. 04/03/15   Elwin Mocha, MD  oxyCODONE (ROXICODONE) 5 MG/5ML solution Take 5 mLs (5 mg total) by mouth every 6 (six) hours as needed for moderate pain or severe pain. 04/03/15   Elwin Mocha, MD  predniSONE (DELTASONE) 10 MG tablet Take 1 tablet (10 mg total) by mouth daily. 03/26/15   Eber Hong, MD  predniSONE (DELTASONE) 20 MG tablet 2 tabs po daily x 3 days, then one tab daily for 3 days. 04/03/15   Elwin Mocha, MD   BP 111/77 mmHg  Pulse 88  Temp(Src) 97.6 F (36.4 C) (Oral)  Resp 15  SpO2 99% Physical Exam  Constitutional: He is oriented to person, place,  and time. He appears well-developed and well-nourished. No distress.  HENT:  Head: Normocephalic and atraumatic.  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  Eyes: Conjunctivae and EOM are normal.  Neck: Neck supple. No tracheal deviation present.  Cardiovascular: Normal rate.   Pulmonary/Chest: Effort normal. No respiratory distress.  Musculoskeletal: Normal range of motion.  Neurological: He is alert and oriented to person, place, and time.  Skin: Skin is warm and dry.  Psychiatric: He has a normal mood and affect. His behavior is normal.  Nursing note and  vitals reviewed.   ED Course  Procedures (including critical care time)  DIAGNOSTIC STUDIES: Oxygen Saturation is 99% on RA, normal by my interpretation.    COORDINATION OF CARE: 2:33 PM - Discussed treatment plan with pt and his mother at bedside which includes Rx steroids and pain medication. Will also give 1 IM steroid injection here. Patient strongly encouraged to follow up with [facility] management. Pt and his mother verbalized understanding and agreed to plan.   MDM   Final diagnoses:  Stomatitis and mucositis   Pt with Behcet's disease, At this time he has no follow-up. He returns with a flareup, states time is swollen, erythematous, or ulcers present. This is typical of his flare. He states usually gets steroid and pain medications. He is not having difficulty swallowing at this time. He is in no distress. His vital signs are normal. Plan to discharge home with steroid course, Lortab, follow-up with [facility]  Filed Vitals:   05/09/15 1415  BP: 111/77  Pulse: 88  Temp: 97.6 F (36.4 C)  TempSrc: Oral  Resp: 15  SpO2: 99%   I personally performed the services described in this documentation, which was scribed in my presence. The recorded information has been reviewed and is accurate.    Jaynie Crumble, PA-C 05/09/15 2011  Melene Plan, DO 05/10/15 0001

## 2015-05-09 NOTE — ED Notes (Signed)
Pt c/o tongue edema x 4 days, states he has Hx of this, it is due to Behcet's disease. Pt states he typically is treated with oral or sometimes IM steroids and sent home. No distress at this time, in control of oral secretions.

## 2015-06-26 ENCOUNTER — Encounter (HOSPITAL_COMMUNITY): Payer: Self-pay

## 2015-06-26 ENCOUNTER — Emergency Department (HOSPITAL_COMMUNITY)
Admission: EM | Admit: 2015-06-26 | Discharge: 2015-06-26 | Disposition: A | Discharge status: Home / Self Care | Payer: Medicaid Other | Attending: Emergency Medicine | Admitting: Emergency Medicine

## 2015-06-26 DIAGNOSIS — K140 Glossitis: Secondary | ICD-10-CM | POA: Insufficient documentation

## 2015-06-26 DIAGNOSIS — K1379 Other lesions of oral mucosa: Secondary | ICD-10-CM | POA: Insufficient documentation

## 2015-06-26 DIAGNOSIS — Z8619 Personal history of other infectious and parasitic diseases: Secondary | ICD-10-CM | POA: Insufficient documentation

## 2015-06-26 DIAGNOSIS — Z720 Tobacco use: Secondary | ICD-10-CM | POA: Diagnosis not present

## 2015-06-26 DIAGNOSIS — Z79899 Other long term (current) drug therapy: Secondary | ICD-10-CM | POA: Insufficient documentation

## 2015-06-26 DIAGNOSIS — Z8614 Personal history of Methicillin resistant Staphylococcus aureus infection: Secondary | ICD-10-CM | POA: Diagnosis not present

## 2015-06-26 MED ORDER — DEXAMETHASONE SODIUM PHOSPHATE 10 MG/ML IJ SOLN
10.0000 mg | Freq: Once | INTRAMUSCULAR | Status: AC
  Administered 2015-06-26: 10 mg via INTRAMUSCULAR
  Filled 2015-06-26: qty 1

## 2015-06-26 MED ORDER — HYDROCODONE-ACETAMINOPHEN 5-325 MG PO TABS: 1.0000 | ORAL_TABLET | Freq: Four times a day (QID) | ORAL | Status: DC | PRN

## 2015-06-26 MED ORDER — PREDNISONE 20 MG PO TABS: 60.0000 mg | ORAL_TABLET | Freq: Every day | ORAL | Status: DC

## 2015-06-26 NOTE — ED Provider Notes (Signed)
CSN: [id number]     Arrival date & time 06/26/15  1441 History  This chart was scribed for non-physician practitioner, Santiago Glad, PA-C, working with Jaci Carrel, MD by Freida Busman, ED Scribe. This patient was seen in room WTR9/WTR9 and the patient's care was started at 4:04 PM.  Chief Complaint  Patient presents with  . Oral Pain   The history is provided by the patient. No language interpreter was used.     HPI Comments:  Paul House is a 24 y.o. male with a history of Behcet's disease,  who presents to the Emergency Department complaining of 8/10 oral pain for 4 days and has progressively worsened since onset. Pt has a history of the same due to his Bechet's disease, he is usually given pain meds and steroids which temporarily resolve his pain.   He states he has be able to eat and drink and is urinating as he normally would. He denies vomiting and fever. Pt does not have a PCP or insurance at this time. No alleviating factors noted.  Past Medical History  Diagnosis Date  . Behcet's disease   . MRSA (methicillin resistant staph aureus) culture positive   . Herpes   . Stomatitis and mucositis   . Recurrent mouth ulceration     "since 24 years old"   Past Surgical History  Procedure Laterality Date  . Biopsy pharynx     Family History  Problem Relation Age of Onset  . Heart disease Mother   . Lupus Neg Hx   . Crohn's disease Neg Hx    Social History  Substance Use Topics  . Smoking status: Current Every Day Smoker -- 0.25 packs/day    Types: Cigarettes  . Smokeless tobacco: Never Used  . Alcohol Use: No     Comment: occ    Review of Systems  Constitutional: Negative for fever and appetite change.  HENT:       + Oral pain  Gastrointestinal: Negative for vomiting.      Allergies  Review of patient's allergies indicates no known allergies.  Home Medications   Prior to Admission medications   Medication Sig Start Date End Date Taking?  Authorizing Provider  Alum & Mag Hydroxide-Simeth (MAGIC MOUTHWASH W/LIDOCAINE) SOLN Take 5 mLs by mouth 3 (three) times daily as needed for mouth pain. 02/02/15   Earley Favor, NP  HYDROcodone-acetaminophen (HYCET) 7.5-325 mg/15 ml solution Take 10 mLs by mouth 4 (four) times daily as needed for moderate pain. 05/09/15   Tatyana Kirichenko, PA-C  nystatin (MYCOSTATIN) 100000 UNIT/ML suspension Take 5 mLs (500,000 Units total) by mouth 4 (four) times daily. 04/03/15   Elwin Mocha, MD  oxyCODONE (ROXICODONE) 5 MG/5ML solution Take 5 mLs (5 mg total) by mouth every 6 (six) hours as needed for moderate pain or severe pain. 04/03/15   Elwin Mocha, MD  predniSONE (DELTASONE) 10 MG tablet Take 6 tabs PO day 1, take 5 tabs day 2 and 3, take 4 tabs day 4 and 5, take 3 tabs day 6 and 7, take 2 tabs day 8 and 9, take 1 tab day 10 and 11 05/09/15   Tatyana Kirichenko, PA-C   BP 133/82 mmHg  Pulse 87  Temp(Src) 99.2 F (37.3 C) (Oral)  Resp 18  SpO2 100% Physical Exam  Constitutional: He is oriented to person, place, and time. He appears well-developed and well-nourished. No distress.  HENT:  Head: Normocephalic and atraumatic.  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  Neck: Normal range of motion. Neck supple.  Cardiovascular: Normal rate and regular rhythm.   Pulmonary/Chest: Effort normal and breath sounds normal.  Musculoskeletal: Normal range of motion.  Neurological: He is alert and oriented to person, place, and time.  Skin: Skin is warm and dry.  Psychiatric: He has a normal mood and affect.  Nursing note and vitals reviewed.   ED Course  Procedures   DIAGNOSTIC STUDIES:  Oxygen Saturation is 100% on RA, normal by my interpretation.    COORDINATION OF CARE:  4:09 PM Discussed treatment plan with pt at bedside and pt agreed to plan.  Labs Review Labs Reviewed - No data to display  Imaging Review No  results found. I have personally reviewed and evaluated these images and lab results as part of my medical decision-making.   EKG Interpretation None      MDM   Final diagnoses:  None  Patient with a history of Behcet's Disease presents today with painful ulcerations of his tongue.   Multiple ED visits for the same.  Wife reports that steroids and pain medication usually help clear it up.  No signs of clinical dehydration.  Patient afebrile and non septic appearing.  Stable for discharge.  Return precautions given.  Patient given referral to [facility].    I personally performed the services described in this documentation, which was scribed in my presence. The recorded information has been reviewed and is accurate.    Santiago Glad, PA-C 06/26/15 2022  Gilda Crease, MD 06/27/15 2224

## 2015-06-26 NOTE — Discharge Instructions (Signed)
Take pain medication as needed.  Do not drive or operate heavy machinery for 4-6 hours after taking pain medication. °

## 2015-06-26 NOTE — ED Notes (Signed)
He c/o mouth soreness d/t Behcet's dis.  He is in no distress.  He states he was seen here recently for same.

## 2015-07-26 ENCOUNTER — Telehealth: Payer: Self-pay

## 2015-07-26 ENCOUNTER — Emergency Department (HOSPITAL_COMMUNITY)
Admission: EM | Admit: 2015-07-26 | Discharge: 2015-07-26 | Disposition: A | Discharge status: Home / Self Care | Payer: Medicaid Other | Attending: Emergency Medicine | Admitting: Emergency Medicine

## 2015-07-26 ENCOUNTER — Encounter (HOSPITAL_COMMUNITY): Payer: Self-pay | Admitting: *Deleted

## 2015-07-26 DIAGNOSIS — Z720 Tobacco use: Secondary | ICD-10-CM | POA: Insufficient documentation

## 2015-07-26 DIAGNOSIS — K121 Other forms of stomatitis: Secondary | ICD-10-CM | POA: Insufficient documentation

## 2015-07-26 DIAGNOSIS — Z8614 Personal history of Methicillin resistant Staphylococcus aureus infection: Secondary | ICD-10-CM | POA: Diagnosis not present

## 2015-07-26 DIAGNOSIS — R220 Localized swelling, mass and lump, head: Secondary | ICD-10-CM | POA: Diagnosis present

## 2015-07-26 DIAGNOSIS — Z8619 Personal history of other infectious and parasitic diseases: Secondary | ICD-10-CM | POA: Diagnosis not present

## 2015-07-26 DIAGNOSIS — K1230 Oral mucositis (ulcerative), unspecified: Secondary | ICD-10-CM | POA: Insufficient documentation

## 2015-07-26 DIAGNOSIS — M352 Behcet's disease: Secondary | ICD-10-CM | POA: Diagnosis not present

## 2015-07-26 MED ORDER — PREDNISONE 20 MG PO TABS
60.0000 mg | ORAL_TABLET | Freq: Once | ORAL | Status: AC
  Administered 2015-07-26: 60 mg via ORAL
  Filled 2015-07-26: qty 3

## 2015-07-26 MED ORDER — PREDNISONE 20 MG PO TABS: 60.0000 mg | ORAL_TABLET | Freq: Every day | ORAL | Status: DC

## 2015-07-26 MED ORDER — HYDROCODONE-ACETAMINOPHEN 5-325 MG PO TABS: 1.0000 | ORAL_TABLET | Freq: Four times a day (QID) | ORAL | Status: DC | PRN

## 2015-07-26 MED ORDER — HYDROCODONE-ACETAMINOPHEN 5-325 MG PO TABS
1.0000 | ORAL_TABLET | Freq: Once | ORAL | Status: AC
  Administered 2015-07-26: 1 via ORAL
  Filled 2015-07-26: qty 1

## 2015-07-26 MED ORDER — MAGIC MOUTHWASH W/LIDOCAINE: 5.0000 mL | Freq: Three times a day (TID) | ORAL | Status: DC | PRN

## 2015-07-26 NOTE — ED Provider Notes (Signed)
CSN: [id number]     Arrival date & time 07/26/15  1409 History  By signing my name below, I, Gwenyth Oberatherine Macek, attest that this documentation has been prepared under the direction and in the presence of Marlon Peliffany Dreux Mcgroarty, PA-C.  Electronically Signed: Gwenyth Oberatherine Macek, ED Scribe. 07/26/2015. 3:26 PM.   Chief Complaint  Patient presents with  . Oral Swelling   The history is provided by the patient. No language interpreter was used.    HPI Comments: Paul House is a 24 y.o. male with a history of Behcet's disease who presents to the Emergency Department complaining of constant, moderate swelling of his tongue that started 3 days ago. He reports multiple sores on his tongue as an associated symptom of his Behcets disease. His pain becomes worse with talking. 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 He has been previously treated with steroids which have improved his symptoms. Pt notes exacerbations have occurred more frequently lately and that his last episode was 2 weeks ago. Pt does not have a PCP because of financial difficulties. He denies any other affected areas. Pt also denies decreased appetite and food/drink intake.   PCP: Default, Provider, MD  Blood pressure 119/81, pulse 119, temperature 98.2 F (36.8 C), temperature source Oral, resp. rate 18, SpO2 98 %.  Past Medical History  Diagnosis Date  . Behcet's disease (HCC)   . MRSA (methicillin resistant staph aureus) culture positive   . Herpes   . Stomatitis and mucositis   . Recurrent mouth ulceration     "since 24 years old"   Past Surgical History  Procedure Laterality Date  . Biopsy pharynx     Family History  Problem Relation Age of Onset  . Heart disease Mother   . Lupus Neg Hx   . Crohn's disease Neg Hx    Social History  Substance Use Topics  . Smoking status: Current Every Day Smoker -- 0.25 packs/day    Types: Cigarettes  . Smokeless  tobacco: Never Used  . Alcohol Use: No     Comment: occ    Review of Systems  Constitutional: Negative for appetite change.  HENT: Positive for facial swelling and mouth sores.    Allergies  Review of patient's allergies indicates no known allergies.  Home Medications   Prior to Admission medications   Medication Sig Start Date End Date Taking? Authorizing Provider  HYDROcodone-acetaminophen (NORCO/VICODIN) 5-325 MG tablet Take 1-2 tablets by mouth every 6 (six) hours as needed. 07/26/15   Marlon Peliffany Mariona Scholes, PA-C  magic mouthwash w/lidocaine SOLN Take 5 mLs by mouth 3 (three) times daily as needed for mouth pain. 07/26/15   Umair Rosiles Neva SeatGreene, PA-C  nystatin (MYCOSTATIN) 100000 UNIT/ML suspension Take 5 mLs (500,000 Units total) by mouth 4 (four) times daily. Patient not taking: Reported on 06/26/2015 04/03/15   Elwin MochaBlair Walden, MD  oxyCODONE (ROXICODONE) 5 MG/5ML solution Take 5 mLs (5 mg total) by mouth every 6 (six) hours as needed for moderate pain or severe pain. Patient not taking: Reported on 06/26/2015 04/03/15   Elwin MochaBlair Walden, MD  predniSONE (DELTASONE) 20 MG tablet Take 3 tablets (60 mg total) by mouth daily. 07/26/15   Paisleigh Maroney Neva SeatGreene, PA-C   BP 136/81 mmHg  Pulse 97  Temp(Src) 98.2 F (36.8 C) (Oral)  Resp 18  SpO2 98% Physical Exam  Constitutional: He appears well-developed and well-nourished. No distress.  HENT:  Head: Normocephalic and atraumatic.  Eyes: Conjunctivae and EOM are normal.  Neck: Neck supple. No tracheal deviation present.  Cardiovascular: Normal rate.   Pulmonary/Chest: Effort normal. No respiratory distress.  Skin: Skin is warm and dry.  Psychiatric: He has a normal mood and affect. His behavior is normal.  Nursing note and vitals reviewed.   ED Course  Procedures  DIAGNOSTIC STUDIES: Oxygen Saturation is 98% on RA, normal by my interpretation.    COORDINATION OF CARE: 3:25 PM Discussed treatment plan with pt which includes consult with social work  regarding PCP. Pt agreed to plan.  Labs Review Labs Reviewed - No data to display  Imaging Review No results found.   EKG Interpretation None      MDM   Final diagnoses:  Behcet's disease (HCC)  Stomatitis and mucositis   Patient originally tachycardic but this improves on re-check. I spoke with Selena Batten, Case Manager about arranging him outpatient follow-up due to more than 7 visits in the past month for same problem. She has agreed to assist. And Will discuss patients PCP follow-up  Medications  HYDROcodone-acetaminophen (NORCO/VICODIN) 5-325 MG per tablet 1 tablet (1 tablet Oral Given 07/26/15 1534)  predniSONE (DELTASONE) tablet 60 mg (60 mg Oral Given 07/26/15 1534)   Rx: Prednisone and Vicodin -- pt reports this typically has worked for him in the past.  24 y.o.Paul House's medical screening exam was performed and I feel the patient has had an appropriate workup for their chief complaint at this time and likelihood of emergent condition existing is low. They have been counseled on decision, discharge, follow up and which symptoms necessitate immediate return to the emergency department. They or their family verbally stated understanding and agreement with plan and discharged in stable condition.   Vital signs are stable at discharge. Filed Vitals:   07/26/15 1542  BP: 136/81  Pulse: 97  Temp:   Resp: 18    I personally performed the services described in this documentation, which was scribed in my presence. The recorded information has been reviewed and is accurate.  I personally performed the services described in this documentation, which was scribed in my presence. The recorded information has been reviewed and is accurate.   Marlon Pel, PA-C 07/26/15 1639  Jerelyn Scott, MD 07/26/15 [phone]

## 2015-07-26 NOTE — Progress Notes (Signed)
CM spoke with [place] who [place].  CM discussed and provided written information for uninsured accepting pcps, discussed the importance of pcp vs EDP services for f/u care, www.needymeds.org, www.goodrx.com, discounted pharmacies and other [place] resources such as [facility] , [facility], affordable care act, financial assistance, uninsured dental services, [place] med assist, DSS and  health department  Reviewed resources for [place] uninsured accepting pcps like [facility], family medicine at [address], [facility], [facility], local urgent care centers, [facility], [facility], general medical clinics, [facility] [place], [facility] urgent [facility] plus others, medication resources, [facility] out patient pharmacies and housing Pt voiced understanding and appreciation of resources provided   Provided [facility] contact information Pt agreed to a referral Cm completed referral Pt to be contact by [facility] clinical liasion  ED CM [facility] CM for [facility] but pt does not meet criteria Pt was [facility] an [facility] for 08/03/15 at 1100-Cm spoke with pt about call [facility] if he could not attend appt vs not showing up CM reviewed Goodrx.com related to getting his medications at a low cost  Pt left ED prior to receiving copies of prednisone and vicodin discount cards but pt will be able to get info using his smart phone he had while CM was showing him how to use goodrx.com

## 2015-07-26 NOTE — ED Notes (Addendum)
Pt complains of painful and swollen tongue for the past 3 days. Pt has hx of Behcet's. Pt states he is able to eat and drink.

## 2015-07-26 NOTE — Discharge Instructions (Signed)
Behcet Syndrome °Behcet syndrome is a rare disorder that involves inflammation of blood vessels (vasculitis) throughout your body. This condition usually begins between the ages of 20 years and 40 years. Behcet syndrome can range from mild to severe and is a condition you will have for the rest of your life (chronic). There is no cure, but symptoms may go away on their own for periods of time. It can cause various symptoms, including open sores (ulcers) in your mouth or on your genitals. It can affect many organs and systems in your body, including your heart, lungs, digestive system, and nervous systems. It can sometimes lead to blindness. Behcet syndrome is not spread from person to person (contagious). °CAUSES  °The exact cause is unknown. The condition tends to run in families. Some genes that increase risk for Behcet syndrome have been identified. If you inherit these genes, it may increase your risk.  °RISK FACTORS °· Being of Asian, Middle Eastern, or Turkish descent. °· Being 20-40 years of age. °SYMPTOMS  °Signs and symptoms vary depending on the areas of the body that are affected. Early and common signs and symptoms include:  °· Open sores on your: °· Mouth. These may look like canker sores. The sores may come and go. °· Genitals. These may come and go and leave scars when they heal. °· Skin. These may appear as painful red bumps or pimples. °· Eye problems including: °· Redness. °· Blurred vision. °· Tearing. °· Pain.  °· Inflammation (uveitis). °· Arthritis.   °· Swelling of the brain and spinal cord (meningoencephalitis). °Less common signs and symptoms may develop over time and can include:  °· Abdominal pain and bleeding in your digestive system. °· Memory loss. °· Behavior changes. °· Loss of interest in things you enjoy (apathy). °· Seizures. °· Blood clots. °· Weakening of blood vessels (aneurysms). °· Chest pain. °· Trouble breathing. °· Impaired speech, balance, and movement. °DIAGNOSIS  °Behcet  syndrome is hard to diagnose because there will be times when you are symptom free. Your health care provider may diagnose the condition based on your medical history and a physical exam. The main factors that help confirm the diagnosis are presence of mouth sores at least three times in 1 year and any two of the following: °· Genital sores that keep coming back. °· Eye inflammation with loss of vision. °· Skin sores that are characteristic of Behcet syndrome. °· A positive skin prick test. If you have the condition, a skin prick may produce a red bump in 1-2 days. °Your health care provider may perform additional tests, including:  °· CT or MRI scans of your joints, brain, or bones. °· Blood vessel studies (angiogram). °· Removing pieces of affected body tissue (biopsy) to check for vasculitis. °TREATMENT  °There is no cure for Behcet syndrome. Treatment typically focuses on relieving your discomfort and preventing serious complications. You may need to work with a team of health care providers because many different parts of your body may be involved. Common treatments include: °· Strong anti-inflammatory medicines (corticosteroids). °· Medicines that suppress your immune system and treat inflammation. °· Steroid creams to treat oral and genital ulcers. °· Other medicines your health care team may recommend based on your symptoms and the parts of your body involved.  °HOME CARE INSTRUCTIONS °Follow all your health care provider's instructions. The instructions you get will depend on your specific symptoms and treatments. General instructions may include: °· Get plenty of rest, especially when your symptoms worsen. °· Get moderate exercise (  walking and swimming) when not experiencing worsening of symptoms. °· Include lots of vegetables, fruits, and whole grains in your diet. °· Avoid high-fat foods. °· Do not smoke. °· Keep all follow-up appointments. °SEEK MEDICAL CARE IF: °· Your symptoms worsen and are not  managed by medicines and home care. °SEEK IMMEDIATE MEDICAL CARE IF: °· You suddenly lose your vision. °· You vomit blood or have blood in your stool. °· You have very bad abdominal pain. °· You suddenly have a very bad headache. °· You have a seizure. °· You have a red, warm, or tender swelling in your leg. °· You have chest pain or trouble breathing. °FOR MORE INFORMATION °American Behcet's Disease Association: www.behcets.com °  °This information is not intended to replace advice given to you by your health care provider. Make sure you discuss any questions you have with your health care provider. °  °Document Released: 09/15/2002 Document Revised: 09/30/2013 Document Reviewed: 08/26/2013 °Elsevier Interactive Patient Education ©2016 Elsevier Inc[employer] ° °Oral Ulcers °Oral ulcers are painful, shallow sores around the lining of the mouth. They can affect the gums, the inside of the lips, and the cheeks. (Sores on the outside of the lips and on the face are different.) They typically first occur in school-aged children and teenagers. Oral ulcers may also be called canker sores or cold sores. °CAUSES  °Canker sores and cold sores can be caused by many factors including: °· Infection. °· Injury. °· Sun exposure. °· Medications. °· Emotional stress. °· Food allergies. °· Vitamin deficiencies. °· Toothpastes containing sodium lauryl sulfate. °The herpes virus can be the cause of mouth ulcers. The first infection can be severe and cause 10 or more ulcers on the gums, tongue, and lips with fever and difficulty in swallowing. This infection usually occurs between the ages of 1 and 3 years.  °SYMPTOMS  °The typical sore is about ¼ inch (6 mm) in size and is an oval or round ulcer with red borders. °DIAGNOSIS  °Your caregiver can diagnose simple oral ulcers by examination. Additional testing is usually not required.  °TREATMENT  °Treatment is aimed at pain relief. Generally, oral ulcers resolve by themselves within 1 to 2 weeks  without medication and are not contagious unless caused by herpes (and other viruses). Antibiotics are not effective with mouth sores. Avoid direct contact with others until the ulcer is completely healed. See your caregiver for follow-up care as recommended. Also: °· Offer a soft diet. °· Encourage plenty of fluids to prevent dehydration. Popsicles and milk shakes can be helpful. °· Avoid acidic and salty foods and drinks such as orange juice. °· Infants and young children will often refuse to drink because of pain. Using a teaspoon, cup, or syringe to give small amounts of fluids frequently can help prevent dehydration. °· Cold compresses on the face may help reduce pain. °· Pain medication can help control soreness. °· A solution of diphenhydramine mixed with a liquid antacid can be useful to decrease the soreness of ulcers. Consult a caregiver for the dosing. °· Liquids or ointments with a numbing ingredient may be helpful when used as recommended. °· Older children and teenagers can rinse their mouth with a salt-water mixture (1/2 teaspoon of salt in 8 ounces of water) four times a day. This treatment is uncomfortable but may reduce the time the ulcers are present. °· There are many over-the-counter throat lozenges and medications available for oral ulcers. Their effectiveness has not been studied. °· Consult your medical caregiver prior   to using homeopathic treatments for oral ulcers. °SEEK MEDICAL CARE IF:  °· You think your child needs to be seen. °· The pain worsens and you cannot control it. °· There are 4 or more ulcers. °· The lips and gums begin to bleed and crust. °· A single mouth ulcer is near a tooth that is causing a toothache or pain. °· Your child has a fever, swollen face, or swollen glands. °· The ulcers began after starting a medication. °· Mouth ulcers keep reoccurring or last more than 2 weeks. °· You think your child is not taking adequate fluids. °SEEK IMMEDIATE MEDICAL CARE IF:  °· Your  child has a high fever. °· Your child is unable to swallow or becomes dehydrated. °· Your child looks or acts very ill. °· An ulcer caused by a chemical your child accidentally put in their mouth. °  °This information is not intended to replace advice given to you by your health care provider. Make sure you discuss any questions you have with your health care provider. °  °Document Released: 11/02/2004 Document Revised: 10/16/2014 Document Reviewed: 02/10/2015 °Elsevier Interactive Patient Education ©2016 Elsevier Inc. ° °

## 2015-07-26 NOTE — Telephone Encounter (Signed)
Message received from Marval RegalKim Gibbs, RN CM requesting that the patient be evaluated for Transitional Care Clinic.  After the review of his record , it was determined that he does not meet TCC criteria.  A hospital follow up [facility]  The information was added to the AVS.  Update provided to K. GIbbs, RN CM.

## 2015-07-30 ENCOUNTER — Ambulatory Visit: Payer: Medicaid Other | Attending: Family Medicine | Admitting: Physician Assistant

## 2015-07-30 VITALS — BP 114/82 | HR 91 | Temp 98.0°F | Resp 18 | Ht 72.0 in | Wt 159.8 lb

## 2015-07-30 DIAGNOSIS — Z79899 Other long term (current) drug therapy: Secondary | ICD-10-CM | POA: Insufficient documentation

## 2015-07-30 DIAGNOSIS — Z720 Tobacco use: Secondary | ICD-10-CM | POA: Insufficient documentation

## 2015-07-30 DIAGNOSIS — M352 Behcet's disease: Secondary | ICD-10-CM | POA: Diagnosis not present

## 2015-07-30 DIAGNOSIS — B370 Candidal stomatitis: Secondary | ICD-10-CM | POA: Insufficient documentation

## 2015-07-30 MED ORDER — NYSTATIN 100000 UNIT/ML MT SUSP: 500000.0000 [IU] | Freq: Four times a day (QID) | OROMUCOSAL | Status: DC

## 2015-07-30 MED ORDER — MAGIC MOUTHWASH W/LIDOCAINE: 5.0000 mL | Freq: Three times a day (TID) | ORAL | Status: DC | PRN

## 2015-07-30 NOTE — Progress Notes (Signed)
Paul House  [id number]  [id number]  DOB - 05/17/1991  Chief Complaint  Patient presents with  . Hospitalization Follow-up       Subjective:   Paul House is a 24 y.o. male here today for establishment of care. He has had 8 emergency department visits in the year 2016. His most recent one was October 17th. They have all been for exacerbations of his Behcet's disorder. He usually gets these blisters or open sores in his mouth, especially his tongue. When he goes to the emergency department they usually hydrate him. He also is usually given a course of steroids and pain medications and this does help. However monthly for the past several months he has been in with breakthroughs. He's had several appointments here but has not kept him. He does not have any insurance. He does not have a primary care provider.  He is currently still under treatment from his hospital visit 2-3 days ago. He states that he's feeling better at this time. He does continue to have a couple of open sores in the right side of the mouth. He also states that he has a white plaque on his tongue.  ROS: GEN: denies fever or chills, denies change in weight Skin: denies lesions or rashes with the exceptions of the ones in his mouth HEENT: denies headache, earache, epistaxis, +sore throat and mouth LUNGS: denies SHOB, dyspnea, PND, orthopnea CV: denies CP or palpitations  ALLERGIES: No Known Allergies  PAST MEDICAL HISTORY: Past Medical History  Diagnosis Date  . Behcet's disease (HCC)   . MRSA (methicillin resistant staph aureus) culture positive   . Herpes   . Stomatitis and mucositis   . Recurrent mouth ulceration     "since 24 years old"    PAST SURGICAL HISTORY: Past Surgical History  Procedure Laterality Date  . Biopsy pharynx      MEDICATIONS AT HOME: Prior to Admission medications   Medication Sig Start Date End Date Taking? Authorizing Provider    HYDROcodone-acetaminophen (NORCO/VICODIN) 5-325 MG tablet Take 1-2 tablets by mouth every 6 (six) hours as needed. 07/26/15  Yes Fernado Brigante Neva SeatGreene, PA-C  predniSONE (DELTASONE) 20 MG tablet Take 3 tablets (60 mg total) by mouth daily. 07/26/15  Yes Marlon Peliffany Greene, PA-C  magic mouthwash w/lidocaine SOLN Take 5 mLs by mouth 3 (three) times daily as needed for mouth pain. Patient not taking: Reported on 07/30/2015 07/26/15   Marlon Peliffany Greene, PA-C  nystatin (MYCOSTATIN) 100000 UNIT/ML suspension Take 5 mLs (500,000 Units total) by mouth 4 (four) times daily. Patient not taking: Reported on 06/26/2015 04/03/15   Elwin MochaBlair Walden, MD  oxyCODONE (ROXICODONE) 5 MG/5ML solution Take 5 mLs (5 mg total) by mouth every 6 (six) hours as needed for moderate pain or severe pain. Patient not taking: Reported on 06/26/2015 04/03/15   Elwin MochaBlair Walden, MD     Objective:   Filed Vitals:   07/30/15 1406  BP: 114/82  Pulse: 91  Temp: 98 F (36.7 C)  TempSrc: Oral  Resp: 18  Height: 6' (1.829 m)  Weight: 159 lb 12.8 oz (72.485 kg)  SpO2: 96%    Exam General appearance : Awake, alert, not in any distress. Speech Clear. Not toxic looking HEENT: Atraumatic and Normocephalic, pupils equally reactive to light and accomodation; oral mucosa erythematous and open sores on right side of tongue; thick white plaque on top of tongue Neck: supple, no JVD. No cervical lymphadenopathy.  Chest:Good air entry bilaterally, no added sounds  CVS: S1 S2 regular,  no murmurs.    Assessment & Plan  1. Behcet's Disease  -rheumatology referral  -Complete current course of Prednisone; prn pain meds  -stay hydrated, rest, lifestyle modifications addressed 2. Oral Thrush  -magic mouthwash/nystatin swishes 3. Tobacco Abuse  -smoking cessation   Referral to our financial counselor for insurance assistance Return in 2 weeks with a PCP for routine health maintenance and an update on current status  The patient was given clear  instructions to go to ER or return to medical center if symptoms don't improve, worsen or new problems develop. The patient verbalized understanding. The patient was told to call to get lab results if they haven't heard anything in the next week.   This note has been created with Education officer, environmental. Any transcriptional errors are unintentional.    Scot Jun, PA-C [facility] [place], [place] [phone]   07/30/2015, 3:05 PM

## 2015-07-30 NOTE — Progress Notes (Signed)
Patient here for HFU.   Patient denies pain at this time.  Patient has difficulty eating and drinking due to sores in his mouth. Patient admits to symptoms being better since being discharged.

## 2015-08-03 ENCOUNTER — Inpatient Hospital Stay: Payer: PRIVATE HEALTH INSURANCE | Admitting: Family Medicine

## 2015-08-06 ENCOUNTER — Ambulatory Visit: Payer: Medicaid Other | Attending: Family Medicine

## 2015-09-09 ENCOUNTER — Encounter (HOSPITAL_COMMUNITY): Payer: Self-pay | Admitting: Emergency Medicine

## 2015-09-09 ENCOUNTER — Emergency Department (HOSPITAL_COMMUNITY)
Admission: EM | Admit: 2015-09-09 | Discharge: 2015-09-09 | Disposition: A | Discharge status: Home / Self Care | Payer: Medicaid Other | Attending: Physician Assistant | Admitting: Physician Assistant

## 2015-09-09 DIAGNOSIS — K121 Other forms of stomatitis: Secondary | ICD-10-CM | POA: Insufficient documentation

## 2015-09-09 DIAGNOSIS — Z8619 Personal history of other infectious and parasitic diseases: Secondary | ICD-10-CM | POA: Insufficient documentation

## 2015-09-09 DIAGNOSIS — Z79899 Other long term (current) drug therapy: Secondary | ICD-10-CM | POA: Insufficient documentation

## 2015-09-09 DIAGNOSIS — F17210 Nicotine dependence, cigarettes, uncomplicated: Secondary | ICD-10-CM | POA: Insufficient documentation

## 2015-09-09 DIAGNOSIS — K1370 Unspecified lesions of oral mucosa: Secondary | ICD-10-CM | POA: Diagnosis present

## 2015-09-09 DIAGNOSIS — Z8739 Personal history of other diseases of the musculoskeletal system and connective tissue: Secondary | ICD-10-CM | POA: Insufficient documentation

## 2015-09-09 DIAGNOSIS — K1230 Oral mucositis (ulcerative), unspecified: Secondary | ICD-10-CM | POA: Insufficient documentation

## 2015-09-09 MED ORDER — MAGIC MOUTHWASH W/LIDOCAINE: 5.0000 mL | Freq: Three times a day (TID) | ORAL | Status: DC | PRN

## 2015-09-09 MED ORDER — MAGIC MOUTHWASH
10.0000 mL | Freq: Once | ORAL | Status: AC
  Administered 2015-09-09: 10 mL via ORAL
  Filled 2015-09-09: qty 10

## 2015-09-09 MED ORDER — PREDNISONE 20 MG PO TABS
60.0000 mg | ORAL_TABLET | Freq: Every day | ORAL | Status: DC
Start: 2015-09-09 — End: 2015-11-27

## 2015-09-09 MED ORDER — PREDNISONE 20 MG PO TABS: 60.0000 mg | ORAL_TABLET | Freq: Every day | ORAL | Status: DC

## 2015-09-09 MED ORDER — PREDNISONE 20 MG PO TABS
60.0000 mg | ORAL_TABLET | Freq: Once | ORAL | Status: AC
  Administered 2015-09-09: 60 mg via ORAL
  Filled 2015-09-09: qty 3

## 2015-09-09 NOTE — ED Provider Notes (Signed)
History  By signing my name below, I, Karle PlumberJennifer Tensley, attest that this documentation has been prepared under the direction and in the presence of Fayrene HelperBowie Danilynn Jemison, PA-C. Electronically Signed: Karle PlumberJennifer Tensley, ED Scribe. 09/09/2015. 1:51 PM.  Chief Complaint  Patient presents with  . Mouth Lesions    recurrent mouth sores   The history is provided by the patient and medical records. No language interpreter was used.    HPI Comments:  Paul House is a 24 y.o. male with PMHx of Behcet's Disease, Stomatitis, Mucositis and Herpes, who presents to the Emergency Department complaining of a sores located in his mouth that appeared about three days ago. He states he currently has a sore on his tongue that is moderately painful, rating it at 5/10. He states the symptoms recur about every four months but have been monthly as of late. He normally takes Prednisone with resolution of the sore. He has not done anything to treat his current symptoms. Touching the area and eating increase the pain. He denies alleviating factors. He denies fever, chills, nausea or vomiting. He states he is supposed to see a specialist in about 4 months (March 2017).  Past Medical History  Diagnosis Date  . Behcet's disease (HCC)   . MRSA (methicillin resistant staph aureus) culture positive   . Herpes   . Stomatitis and mucositis   . Recurrent mouth ulceration     "since 24 years old"   Past Surgical History  Procedure Laterality Date  . Biopsy pharynx     Family History  Problem Relation Age of Onset  . Heart disease Mother   . Lupus Neg Hx   . Crohn's disease Neg Hx    Social History  Substance Use Topics  . Smoking status: Current Every Day Smoker -- 0.25 packs/day    Types: Cigarettes  . Smokeless tobacco: Never Used  . Alcohol Use: No     Comment: occ    Review of Systems  Constitutional: Negative for fever and chills.  HENT: Positive for mouth sores.   Gastrointestinal: Negative for nausea  and vomiting.    Allergies  Review of patient's allergies indicates no known allergies.  Home Medications   Prior to Admission medications   Medication Sig Start Date End Date Taking? Authorizing Provider  HYDROcodone-acetaminophen (NORCO/VICODIN) 5-325 MG tablet Take 1-2 tablets by mouth every 6 (six) hours as needed. 07/26/15   Marlon Peliffany Greene, PA-C  magic mouthwash w/lidocaine SOLN Take 5 mLs by mouth 3 (three) times daily as needed for mouth pain. 09/09/15   Fayrene HelperBowie Azana Kiesler, PA-C  nystatin (MYCOSTATIN) 100000 UNIT/ML suspension Take 5 mLs (500,000 Units total) by mouth 4 (four) times daily. 07/30/15   Vivianne Masteriffany S Noel, PA-C  oxyCODONE (ROXICODONE) 5 MG/5ML solution Take 5 mLs (5 mg total) by mouth every 6 (six) hours as needed for moderate pain or severe pain. Patient not taking: Reported on 06/26/2015 04/03/15   Elwin MochaBlair Walden, MD  predniSONE (DELTASONE) 20 MG tablet Take 3 tablets (60 mg total) by mouth daily. 09/09/15   Fayrene HelperBowie Martine Trageser, PA-C   Triage Vitals: BP 128/87 mmHg  Pulse 90  Temp(Src) 98.1 F (36.7 C) (Oral)  Resp 18  Ht 6' (1.829 m)  Wt 155 lb (70.308 kg)  BMI 21.02 kg/m2  SpO2 98% Physical Exam  Constitutional: He is oriented to person, place, and time. He appears well-developed and well-nourished.  HENT:  Head: Normocephalic and atraumatic.  Mouth/Throat: Oral lesions present. There is trismus (mild) in the jaw. No dental  abscesses.  Ulceration noted to lateral aspect of tongue as well as along gumline and tip of tongue. No obvious abscess noted. Mild trismus noted.  Eyes: EOM are normal.  Neck: Normal range of motion.  Cardiovascular: Normal rate.   Pulmonary/Chest: Effort normal.  Musculoskeletal: Normal range of motion.  Neurological: He is alert and oriented to person, place, and time.  Skin: Skin is warm and dry.  Psychiatric: He has a normal mood and affect. His behavior is normal.  Nursing note and vitals reviewed.   ED Course  Procedures (including critical care  time) DIAGNOSTIC STUDIES: Oxygen Saturation is 98% on RA, normal by my interpretation.   COORDINATION OF CARE: 1:50 PM- evidence of stomatitis likely 2/2 Behcet's disease.  Will prescribe Prednisone and Magic Mouthwash. Will give first doses prior to discharge. Pt verbalizes understanding and agrees to plan.  Medications  predniSONE (DELTASONE) tablet 60 mg (not administered)  magic mouthwash (not administered)    MDM   Final diagnoses:  Stomatitis and mucositis    BP 128/87 mmHg  Pulse 90  Temp(Src) 98.1 F (36.7 C) (Oral)  Resp 18  Ht 6' (1.829 m)  Wt 70.308 kg  BMI 21.02 kg/m2  SpO2 98%   I personally performed the services described in this documentation, which was scribed in my presence. The recorded information has been reviewed and is accurate.       Fayrene Helper, PA-C 09/09/15 1352  Courteney Randall An, MD 09/11/15 6213

## 2015-09-09 NOTE — Discharge Instructions (Signed)
Stomatitis  Stomatitis is a condition that causes inflammation in your mouth. It can affect a part of your mouth or your whole mouth. The condition often affects your cheek, teeth, gums, lips, and tongue. Stomatitis can also affect the mucous membranes that surround your mouth (mucosa).  Pain from stomatitis can make it hard for you to eat or drink. Severe cases of this condition can lead to dehydration or poor nutrition.  CAUSES  Common causes of this condition include:  · Viruses, such as cold sores or oral herpes and shingles.  · Canker sores.  · Bacterial infections.  · Fungus or yeast infections, such as oral thrush.  · Not getting adequate nutrition.  · Injury to your mouth. This can be from:    Dentures or braces that do not fit well.    Biting your tongue or cheek.    Burning your mouth.    Having sharp or broken teeth.  · Gum disease.  · Using tobacco, especially chewing tobacco.  · Allergies to foods, medicines, or substances that are used in your mouth.  · Medicines, including cancer medicines (chemotherapy), antihistamines, and seizure medicines.  In some cases, the cause may not be known.  RISK FACTORS  This condition is more likely to develop in people who:  · Have poor oral hygiene or poor nutrition.  · Have any condition that causes a dry mouth.  · Are under a lot of physical or emotional stress.  · Have any condition that weakens the body's defense system (immune system).  · Are being treated for cancer.  · Smoke.  SYMPTOMS  The most common symptoms of this condition are pain, swelling, and redness inside your mouth. The pain may feel like burning or stinging. It may get worse from eating or drinking. Other symptoms include:  · Painful, shallow sores (ulcers) in the mouth.  · Blisters in the mouth.  · Bleeding gums.  · Swollen gums.  · Irritability and fatigue.  · Bad breath.  · Bad taste in the mouth.  · Fever.  DIAGNOSIS  This condition is diagnosed with a physical exam to check for bleeding gums  and mouth ulcers. You may also have other tests, including:  · Blood tests to look for infection or vitamin deficiencies.  · Mouth swab to get a fluid sample to test for bacteria (culture).  · Tissue sample from an ulcer to examine under a microscope (biopsy).  TREATMENT  Treatment for stomatitis depends on the cause. Treatment may include medicines, such as:  · Over-the counter (OTC) pain medicines.  · Topical anesthetic to numb the area if you have severe pain.  · Antibiotics to treat a bacterial infection.  · Antifungals to treat a fungal infection.  · Antivirals to treat a viral infection.  · Mouth rinses that contain steroids to reduce the swelling in your mouth.  · Other medicines to coat or numb your mouth.  HOME CARE INSTRUCTIONS  Medicines  · Take medicines only as directed by your health care provider.  · If you were prescribed an antibiotic, finish all of it even if you start to feel better.  Lifestyle  · Practice good oral hygiene:    Gently brush your teeth with a soft, nylon-bristled toothbrush two times each day.    Floss your teeth every day.    Have your teeth cleaned regularly, as recommended by your dentist.  · Eat a balanced diet. Do not eat:    Spicy foods.      Citrus, such as oranges.    Foods that have sharp edges, such as chips.  · Avoid any foods or other allergens that you think may be causing your stomatitis.  · If you have dentures, make sure that they are properly fitted.  · Do not use any tobacco products, including cigarettes, chewing tobacco, or electronic cigarettes. If you need help quitting, ask your health care provider.  · Find ways to reduce stress. Try yoga or meditation. Ask your health care provider for other ideas.  General Instructions  · Use a salt-water rinse for pain as directed by your health care provider. Mix 1 tsp of salt in 2 cups of water.  · Drink enough fluid to keep your urine clear or pale yellow. This will keep you hydrated.  SEEK MEDICAL CARE IF:  · Your  symptoms get worse.  · You develop new symptoms, especially:    A rash.    New symptoms that do not involve your mouth area.  · Your symptoms last longer than three weeks.  · Your stomatitis goes away and then returns.  · You have a harder time eating and drinking normally.  · You have increasing fatigue or weakness.  · You lose your appetite or you feel nauseous.  · You have a fever.     This information is not intended to replace advice given to you by your health care provider. Make sure you discuss any questions you have with your health care provider.     Document Released: 07/23/2007 Document Revised: 02/09/2015 Document Reviewed: 09/21/2014  Elsevier Interactive Patient Education ©2016 [employer] Inc[employer]

## 2015-09-09 NOTE — ED Notes (Signed)
Pt has recurrent mouth sores due to chronic condition. Ulcerations to tongue and gum line noted

## 2015-10-04 ENCOUNTER — Emergency Department (HOSPITAL_COMMUNITY)
Admission: EM | Admit: 2015-10-04 | Discharge: 2015-10-04 | Disposition: A | Discharge status: Home / Self Care | Payer: Medicaid Other | Attending: Emergency Medicine | Admitting: Emergency Medicine

## 2015-10-04 DIAGNOSIS — Z79899 Other long term (current) drug therapy: Secondary | ICD-10-CM | POA: Insufficient documentation

## 2015-10-04 DIAGNOSIS — K1379 Other lesions of oral mucosa: Secondary | ICD-10-CM | POA: Diagnosis present

## 2015-10-04 DIAGNOSIS — F17210 Nicotine dependence, cigarettes, uncomplicated: Secondary | ICD-10-CM | POA: Insufficient documentation

## 2015-10-04 DIAGNOSIS — Z7952 Long term (current) use of systemic steroids: Secondary | ICD-10-CM | POA: Diagnosis not present

## 2015-10-04 DIAGNOSIS — Z8614 Personal history of Methicillin resistant Staphylococcus aureus infection: Secondary | ICD-10-CM | POA: Insufficient documentation

## 2015-10-04 DIAGNOSIS — M352 Behcet's disease: Secondary | ICD-10-CM | POA: Diagnosis not present

## 2015-10-04 DIAGNOSIS — Z8619 Personal history of other infectious and parasitic diseases: Secondary | ICD-10-CM | POA: Diagnosis not present

## 2015-10-04 MED ORDER — PREDNISONE 20 MG PO TABS
60.0000 mg | ORAL_TABLET | Freq: Once | ORAL | Status: AC
  Administered 2015-10-04: 60 mg via ORAL

## 2015-10-04 MED ORDER — MAGIC MOUTHWASH W/LIDOCAINE: 5.0000 mL | Freq: Three times a day (TID) | ORAL | Status: DC | PRN

## 2015-10-04 MED ORDER — IBUPROFEN 800 MG PO TABS: 800.0000 mg | ORAL_TABLET | Freq: Three times a day (TID) | ORAL | Status: DC

## 2015-10-04 MED ORDER — PREDNISONE 10 MG (21) PO TBPK: 10.0000 mg | ORAL_TABLET | Freq: Every day | ORAL | Status: DC

## 2015-10-04 MED ORDER — IBUPROFEN 800 MG PO TABS
800.0000 mg | ORAL_TABLET | Freq: Once | ORAL | Status: AC
Start: 2015-10-04 — End: 2015-10-04
  Administered 2015-10-04: 800 mg via ORAL
  Filled 2015-10-04: qty 1

## 2015-10-04 NOTE — ED Notes (Signed)
Pt states he has Behcets disease . He presents with sores in his mouth which are painful. Pt denies feeling short of breath. When this occurs pt is put on prednisone.

## 2015-10-04 NOTE — Discharge Instructions (Signed)
You have been seen today for mouth sores and pain. Follow up with PCP as needed for chronic management of this issue. Return to ED should symptoms worsen.   Emergency Department Resource Guide 1) Find a Doctor and Pay Out of Pocket Although you won't have to find out who is covered by your insurance plan, it is a good idea to ask around and get recommendations. You will then need to call the office and see if the doctor you have chosen will accept you as a new patient and what types of options they offer for patients who are self-pay. Some doctors offer discounts or will set up payment plans for their patients who do not have insurance, but you will need to ask so you aren't surprised when you get to your appointment.  2) Contact Your Local Health Department Not all health departments have doctors that can see patients for sick visits, but many do, so it is worth a call to see if yours does. If you don't know where your local health department is, you can check in your phone book. The CDC also has a tool to help you locate your state's health department, and many state websites also have listings of all of their local health departments.  3) Find a Walk-in Clinic If your illness is not likely to be very severe or complicated, you may want to try a walk in clinic. These are popping up all over the country in pharmacies, drugstores, and shopping centers. They're usually staffed by nurse practitioners or physician assistants that have been trained to treat common illnesses and complaints. They're usually fairly quick and inexpensive. However, if you have serious medical issues or chronic medical problems, these are probably not your best option.  No Primary Care Doctor: - Call Health Connect at  [phone] - they can help you locate a primary care doctor that  accepts your insurance, provides certain services, etc. - Physician Referral Service- [phone]  Chronic Pain Problems: Organization          Address  Phone   Notes  [facility]  [phone](336) [phone] Patients need to be referred by their primary care doctor.   Medication Assistance: Organization         Address  Phone   Notes  [facility] [address] [place], [place] [address] [phone]) [phone] --Must be a resident of [facility] [place] -- Must have NO insurance coverage whatsoever (no Medicaid/ Medicare, etc.) -- The pt. MUST have a primary care doctor that directs their care regularly and follows them in the community   MedAssist  [phone]) [phone]   [employer]  [phone]    Agencies that provide inexpensive medical care: Organization         Address  Phone   Notes  [facility]  [phone]) [phone]   [facility]    [phone]   [facility] [address] [place], [place] [address] [phone]) [phone]   [facility] [address], [place] [phone]) [phone]   Planned Parenthood    [phone]) [phone]   [facility]    [phone]   [facility]  [address], [place] Phone:  [phone], Fax:  [phone] Hours of Operation:  9 am - 6 pm, M-F.  Also accepts Medicaid/Medicare and self-pay.  [facility]  [address]  [address], [place] Phone: [phone], Fax: [phone]. Hours of Operation:  8:30 am - 5:30 pm, M-F.  Also accepts Medicaid and self-pay.  [facility] [address] [place] Phone: [phone]   [place], [place], [place] [phone], Ext. [phone] Mondays & Thursdays: 7-9 AM.  First 15 patients are seen on a first come, first serve basis.    [place] Providers:  Organization         Address  Phone   Notes  [facility] [address], [place] [phone] Also accepts self-pay patients.  [facility] [address], [place]  [phone]   [place], [address], [place] [phone]   [facility] [address], [place] [phone]   Lucianne Lei [address], [place]   [phone] Only accepts [place] Access [place] patients after they have their name applied to their card.   Self-Pay (no insurance) in [facility]:  Organization         Address  Phone   Notes  Sickle Cell Patients, [facility] Internal [facility] [place] [phone]   [facility] [place] [phone]   [facility] [place]  [place], [place], [place] [phone]   [facility]/Dr. Osei-Bonsu  [address] [place] or [address], [place] [phone] Phone number for both [place] and [place] locations is the same.  Urgent Medical and [facility] [address] [place] [phone]   [facility] [address] [place] or [address] [phone]   [facility] [address], [place] [phone], phone; [phone], fax Sees patients 1st and 3rd Saturday of every month.  Must not qualify for public or private insurance (i.e. Medicaid, Medicare, [place] Health Choice, Veterans' Benefits)  Household income should be no more than 200% of the poverty level The clinic cannot treat you if you are pregnant or think you are pregnant  Sexually transmitted diseases are not treated at the clinic.    Dental Care: Organization         Address  Phone  Notes  [facility] Department of [facility] [phone] Accepts children up to age 74 who are enrolled in [place] or [place]; pregnant women with a Medicaid card; and  children who have applied for Medicaid or [place] Health Choice, but were declined, whose parents can pay a reduced fee at time of service.  [facility] Department of [facility]  [address], [place] [phone] Accepts children up to age 79 who are enrolled in [place] or [place]; pregnant women with a Medicaid card; and children who have applied for Medicaid or [place] Health Choice, but were declined, whose parents can pay a reduced fee at time of service.  [facility] PRO[facility]  [place] [phone] Patients are seen by appointment only. Walk-ins are not accepted. [place] will see patients 64 years of age and older. Monday - Tuesday (8am-5pm) Most Wednesdays (8:30-5pm) $30 per visit, cash only  [place]  [address], [place] [phone] Patients are seen by appointment only. Walk-ins are not accepted. [place] will see patients 31 years of age and older. One Wednesday Evening (Monthly: Volunteer Based).  $30 per visit, cash only  [place]  [phone] for adults; Children under age 53, call Graduate Pediatric Dentistry at [phone]. Children aged 35-14, please call [phone] to request a pediatric application.  Dental services are provided in all areas of dental care including fillings, crowns and bridges, complete and partial dentures, implants, gum treatment, root canals, and extractions. Preventive care is also provided. Treatment is provided to both adults and children. Patients are selected via a lottery and there is often a waiting list.   [facility] [address] [place]  [phone] [email or web address]   [facility] [address] [place], [place] [phone], Ext. [phone] Second and Fourth Thursday of each month, opens at 6:30 AM; Clinic ends at 9 AM.  Patients are seen on a first-come first-served  basis, and a limited number are seen during each clinic.   [facility]  [address] [place] Danker [place], [place] [phone]   Eligibility Requirements You must have lived in [place], [place], or [place] counties for at least the last three months.   You cannot be eligible for state or federal sponsored [employer], including [employer], [place], or [employer].   You generally cannot be eligible for healthcare insurance through your employer.    How to apply: Eligibility screenings are held every Tuesday and Wednesday afternoon from 1:00 pm until 4:00 pm. You do not need an appointment for the interview!  [facility] [address], [place], [place]   [place]  [place] Department  [place]  [phone]    Behavioral Health Resources in the Community: Intensive Outpatient Programs Organization         Address  Phone  Notes  [place] [address], [place], [place] [phone]   [facility] Outpatient [address] [place], [place]   ADS: Alcohol & Drug Svcs [address], [place], [place]   [place] [address],  [place], [place] or [phone]   Substance Abuse Resources Organization         Address  Phone  Notes  Alcohol and Drug Services  [phone]   [place]  [phone]   The [place]   [place]  [phone]   Residential & Outpatient Substance Abuse Program  [phone]   Psychological Services Organization         Address  Phone  Notes  [facility] [place]  [place]  [phone]   [place] [address], [place] or [phone]    Mobile Crisis Teams Organization          Address  Phone  Notes  [facility]atives, Mobile Crisis Care Unit  [phone]   [facility]  [address] [place], [place]   [place] Levels [address], [place] [phone]    Self-Help/Support Groups Organization         Address  Phone             Notes  [place] of [place] -  variety of support groups  [phone]- [phone] Call for more information  Narcotics Anonymous (NA), [facility]aring [facility] [address], [place]  2 meetings at this location   Residential Facilities manager         Address  Phone  Notes  [facility] [place],    [place]  [phone]   [facility]  [address] [place] [id number], [place], [place]   [place], [place] [phone] Admissions: 8am-3pm M-F  Incentives Substance [place] [address]    [place], [place] [phone]   [facility] [address] [place], [place], [place]   [facility] [address]  [place], [place]   [facility]ight Programs - Intensive Outpatient [address], [place], [place]   [facility] [place].,  [place], [place] [phone] or [phone]   [facility]idential Treatment Services (RTS) [address], [place], [place] Accepts Medicaid  [facility] [place] [address],  [place] [phone] Substance Abuse/Addiction Treatment   [facility] Organization         Address  Phone  Notes  [facility]  [phone]   Domenic Schwab, PhD [address] [place]lis [place], [place]   [phone] or [phone]   [place], [place] [phone]   [facility] [phone] [place], [place] [phone] Insurance/Medicaid/sponsorship  through [facility] and Families [address] [place]                                    [place], [place] [phone] [place] [address], [place] [phone]    Dr. Adele Schilder  [phone]   [facility] Dept. 1) [address]  2) [place] 3)  [place] [address], [place] [phone]  [phone]   [place] [phone] or [phone] (After Hours)

## 2015-10-04 NOTE — ED Provider Notes (Signed)
CSN: [id number]     Arrival date & time 10/04/15  1259 History  By signing my name below, I, Paul House, attest that this documentation has been prepared under the direction and in the presence of Shawn Joy, PA-C. Electronically Signed: Phillis HaggisGabriella House, ED Scribe. 10/04/2015. 1:39 PM.   Chief Complaint  Patient presents with  . Mouth Lesions   The history is provided by the patient. No language interpreter was used.  HPI Comments: Paul ReachChristopher House is a 24 y.o. Male with a hx of Behcet's Disease, MRSA, herpes, stomatitis, mucositis, and recurrent mouth ulceration who presents to the Emergency Department complaining of recurrent mouth lesions onset 2 days ago. Pt states that this is a flare up of his Behcet's. He reports his flare ups usually come every 4 months, but it has been more frequent recently. He states that he was last seen in the ED for the same on 09/09/15. He is regularly prescribed steroids for treatment when he comes into the ED. Pt is due to see a specialist in March 2017. He denies fever, chills, sore throat, eye pain, SOB, difficulty swallowing, nausea, or vomiting.   Past Medical History  Diagnosis Date  . Behcet's disease (HCC)   . MRSA (methicillin resistant staph aureus) culture positive   . Herpes   . Stomatitis and mucositis   . Recurrent mouth ulceration     "since 24 years old"   Past Surgical History  Procedure Laterality Date  . Biopsy pharynx     Family History  Problem Relation Age of Onset  . Heart disease Mother   . Lupus Neg Hx   . Crohn's disease Neg Hx    Social History  Substance Use Topics  . Smoking status: Current Every Day Smoker -- 0.25 packs/day    Types: Cigarettes  . Smokeless tobacco: Never Used  . Alcohol Use: No     Comment: occ    Review of Systems  Constitutional: Negative for fever and chills.  HENT: Positive for mouth sores. Negative for sore throat.   Eyes: Negative for pain.  Respiratory: Negative for shortness of  breath.   Gastrointestinal: Negative for nausea and vomiting.  All other systems reviewed and are negative.  Allergies  Review of patient's allergies indicates no known allergies.  Home Medications   Prior to Admission medications   Medication Sig Start Date End Date Taking? Authorizing Provider  HYDROcodone-acetaminophen (NORCO/VICODIN) 5-325 MG tablet Take 1-2 tablets by mouth every 6 (six) hours as needed. 07/26/15   Tiffany Neva SeatGreene, PA-C  ibuprofen (ADVIL,MOTRIN) 800 MG tablet Take 1 tablet (800 mg total) by mouth 3 (three) times daily. 10/04/15   Shawn C Joy, PA-C  magic mouthwash w/lidocaine SOLN Take 5 mLs by mouth 3 (three) times daily as needed for mouth pain. 09/09/15   Fayrene HelperBowie Tran, PA-C  magic mouthwash w/lidocaine SOLN Take 5 mLs by mouth 3 (three) times daily as needed for mouth pain. 10/04/15   Shawn C Joy, PA-C  nystatin (MYCOSTATIN) 100000 UNIT/ML suspension Take 5 mLs (500,000 Units total) by mouth 4 (four) times daily. 07/30/15   Vivianne Masteriffany S Noel, PA-C  oxyCODONE (ROXICODONE) 5 MG/5ML solution Take 5 mLs (5 mg total) by mouth every 6 (six) hours as needed for moderate pain or severe pain. Patient not taking: Reported on 06/26/2015 04/03/15   Elwin MochaBlair Walden, MD  predniSONE (DELTASONE) 20 MG tablet Take 3 tablets (60 mg total) by mouth daily. 09/09/15   Fayrene HelperBowie Tran, PA-C  predniSONE (STERAPRED UNI-PAK 21 TAB)  10 MG (21) TBPK tablet Take 1 tablet (10 mg total) by mouth daily. Take 6 tabs by mouth daily  for 2 days, then 5 tabs for 2 days, then 4 tabs for 2 days, then 3 tabs for 2 days, 2 tabs for 2 days, then 1 tab by mouth daily for 2 days 10/04/15   Shawn C Joy, PA-C   BP 134/90 mmHg  Pulse 81  Temp(Src) 98.3 F (36.8 C) (Oral)  Ht 6' (1.829 m)  Wt 72.576 kg  BMI 21.70 kg/m2  SpO2 96% Physical Exam  Constitutional: He is oriented to person, place, and time. He appears well-developed and well-nourished.  HENT:  Head: Normocephalic and atraumatic.  White macular lesions on the  buccal mucosa bilaterally; no pustules or discharge.  Eyes: EOM are normal.  Neck: Normal range of motion. Neck supple.  Cardiovascular: Normal rate and regular rhythm.   Pulmonary/Chest: Effort normal.  Musculoskeletal: Normal range of motion.  Lymphadenopathy:    He has no cervical adenopathy.  Neurological: He is alert and oriented to person, place, and time.  Skin: Skin is warm and dry.  Psychiatric: He has a normal mood and affect. His behavior is normal.  Nursing note and vitals reviewed.   ED Course  Procedures (including critical care time) DIAGNOSTIC STUDIES: Oxygen Saturation is 96% on RA, normal by my interpretation.    COORDINATION OF CARE: 1:38 PM-Discussed treatment plan which includes steroids and mouthwash with pt at bedside and pt agreed to plan.    Labs Review Labs Reviewed - No data to display  Imaging Review No results found. I have personally reviewed and evaluated these images and lab results as part of my medical decision-making.   EKG Interpretation None      MDM   Final diagnoses:  Behcet's disease (HCC)  Mouth sores    [name] presents with mouth sores and pain.  This patient's presentation is consistent with a classic flareup of Behcet's disease. Patient confirms the same. Treatment is steroids, mouthwash, and anti-inflammatory medications. Patient has no difficulty breathing or swallowing. Patient is in no apparent distress. Patient has no red flag symptoms. Patient was given instructions for home care as well as return precautions. Patient voiced understanding of these instructions, accepts the plan is comfortable with discharge. I personally performed the services described in this documentation, which was scribed in my presence. The recorded information has been reviewed and is accurate.  Filed Vitals:   10/04/15 1316  BP: 134/90  Pulse: 81  Temp: 98.3 F (36.8 C)  TempSrc: Oral  Height: 6' (1.829 m)  Weight: 72.576  kg  SpO2: 96%      Anselm Pancoast, PA-C 10/04/15 1347  Bethann Berkshire, MD 10/07/15 1031

## 2015-11-27 ENCOUNTER — Emergency Department (HOSPITAL_COMMUNITY)
Admission: EM | Admit: 2015-11-27 | Discharge: 2015-11-27 | Disposition: A | Discharge status: Home / Self Care | Payer: Medicaid Other | Attending: Emergency Medicine | Admitting: Emergency Medicine

## 2015-11-27 ENCOUNTER — Encounter (HOSPITAL_COMMUNITY): Payer: Self-pay | Admitting: Emergency Medicine

## 2015-11-27 DIAGNOSIS — Z8614 Personal history of Methicillin resistant Staphylococcus aureus infection: Secondary | ICD-10-CM | POA: Diagnosis not present

## 2015-11-27 DIAGNOSIS — Z8719 Personal history of other diseases of the digestive system: Secondary | ICD-10-CM | POA: Diagnosis not present

## 2015-11-27 DIAGNOSIS — Z8619 Personal history of other infectious and parasitic diseases: Secondary | ICD-10-CM | POA: Diagnosis not present

## 2015-11-27 DIAGNOSIS — K1379 Other lesions of oral mucosa: Secondary | ICD-10-CM | POA: Diagnosis present

## 2015-11-27 DIAGNOSIS — M352 Behcet's disease: Secondary | ICD-10-CM | POA: Diagnosis not present

## 2015-11-27 DIAGNOSIS — F17210 Nicotine dependence, cigarettes, uncomplicated: Secondary | ICD-10-CM | POA: Diagnosis not present

## 2015-11-27 MED ORDER — MAGIC MOUTHWASH
5.0000 mL | Freq: Four times a day (QID) | ORAL | Status: DC | PRN
Start: 2015-11-27 — End: 2015-12-18

## 2015-11-27 MED ORDER — PREDNISONE 20 MG PO TABS: 60.0000 mg | ORAL_TABLET | Freq: Every day | ORAL | Status: DC

## 2015-11-27 NOTE — ED Notes (Addendum)
Pt states he has sores in his mouth beginning x 3 days prior. No obvious sores visible, tongue pink with white coating posteriorly. No c/o sore throat, cough, fever, nasal congestion. States he had this problem one month prior and that prednisone helped. Previous hx of recurrent mouth ulceration, stomatitis

## 2015-11-27 NOTE — ED Provider Notes (Signed)
CSN: [id number]     Arrival date & time 11/27/15  0857 History   First MD Initiated Contact with Patient 11/27/15 [phone]     Chief Complaint  Patient presents with  . Mouth Lesions     (Consider location/radiation/quality/duration/timing/severity/associated sxs/prior Treatment) HPI  Saban Heinlen is a 25 y.o. male who complains of pain in his upper right neck, mouth and pain with swallowing, for several days. He states this is similar to prior episodes of exacerbation of the Behcet's disease. He last saw his PCP for the problem, several months ago, and has since been evaluated and treated in the emergency department twice. His PCP. Discuss referral to a rheumatologist, but that has not been done yet. He denies fever, chills, cough, shortness of breath, chest pain, weakness or dizziness. He denies weight loss. There are no other known modifying factors.    Past Medical History  Diagnosis Date  . Behcet's disease (HCC)   . MRSA (methicillin resistant staph aureus) culture positive   . Herpes   . Stomatitis and mucositis   . Recurrent mouth ulceration     "since 25 years old"   Past Surgical History  Procedure Laterality Date  . Biopsy pharynx     Family History  Problem Relation Age of Onset  . Heart disease Mother   . Lupus Neg Hx   . Crohn's disease Neg Hx    Social History  Substance Use Topics  . Smoking status: Current Every Day Smoker -- 0.25 packs/day    Types: Cigarettes  . Smokeless tobacco: Never Used  . Alcohol Use: No     Comment: occ    Review of Systems  All other systems reviewed and are negative.     Allergies  Review of patient's allergies indicates no known allergies.  Home Medications   Prior to Admission medications   Medication Sig Start Date End Date Taking? Authorizing Provider  magic mouthwash SOLN Take 5 mLs by mouth 4 (four) times daily as needed for mouth pain. 11/27/15   Mancel Bale, MD  predniSONE (DELTASONE) 20 MG tablet  Take 3 tablets (60 mg total) by mouth daily. 11/27/15   Mancel Bale, MD   BP 121/92 mmHg  Pulse 95  Temp(Src) 98 F (36.7 C) (Oral)  Resp 18  SpO2 97% Physical Exam  Constitutional: He is oriented to person, place, and time. He appears well-developed and well-nourished.  HENT:  Head: Normocephalic and atraumatic.  Right Ear: External ear normal.  Left Ear: External ear normal.  No visible oral lingual or labial lesions. Normal swallowing of saliva visualized.  Eyes: Conjunctivae and EOM are normal. Pupils are equal, round, and reactive to light.  Neck: Normal range of motion and phonation normal. Neck supple.  No swelling, deformity or tenderness of the neck. No palpable lymphadenopathy of the neck.  Cardiovascular: Normal rate.   Pulmonary/Chest: Effort normal. He exhibits no bony tenderness.  Musculoskeletal: Normal range of motion.  Neurological: He is alert and oriented to person, place, and time. No cranial nerve deficit or sensory deficit. He exhibits normal muscle tone. Coordination normal.  Skin: Skin is warm, dry and intact.  Psychiatric: He has a normal mood and affect. His behavior is normal. Judgment and thought content normal.  Nursing note and vitals reviewed.   ED Course  Procedures (including critical care time)  Medications - No data to display  Patient Vitals for the past 24 hrs:  BP Temp Temp src Pulse Resp SpO2  11/27/15 0908 121/92  mmHg 98 F (36.7 C) Oral 95 18 97 %   Chart reviewed, in Epic. No evidence that the patient has a referral in place for rheumatology.  9:48 AM Reevaluation with update and discussion. After initial assessment and treatment, an updated evaluation reveals no change in clinical status. Findings discussed with the patient, all questions answered.Mancel Bale L    Labs Review Labs Reviewed - No data to display  Imaging Review No results found. I have personally reviewed and evaluated these images and lab results as part of  my medical decision-making.   EKG Interpretation None      MDM   Final diagnoses:  Behcet recurrent disease (HCC)    Nonspecific oropharynx and upper neck pain. Doubt acute bacterial infection. No evidence for dental infection. Possible thrush, nonvisualized in the esophagus. I do not see evidence for exacerbation of Behcet's disease. Giving him the benefit of doubt, I will prescribe a short term course of prednisone, and Magic mouthwash, without lidocaine. I believe the lidocaine would potentially worsen his condition, and potentially lead to aspiration. Patient will be given instructions to follow-up with PCP, rheumatology, and was informed verbally, and by written information, that the emergency department is, not a place where his chronic illness can be managed.  Nursing Notes Reviewed/ Care Coordinated Applicable Imaging Reviewed Interpretation of Laboratory Data incorporated into ED treatment  The patient appears reasonably screened and/or stabilized for discharge and I doubt any other medical condition or other [facility] requiring further screening, evaluation, or treatment in the ED at this time prior to discharge.  Plan: Home Medications- Prednisone X 3, Magic Mouthwash; Home Treatments- rest; return here if the recommended treatment, does not improve the symptoms; Recommended follow up- PCP prn, and as above noted     Mancel Bale, MD 11/27/15 [phone]

## 2015-11-27 NOTE — Discharge Instructions (Signed)
There is no clear cause of your discomfort today. It could be related to the Behcet, illness, but other things can cause it as well. It is important to follow-up with your primary care doctor for treatment. The Emergency department cannot continue to treat this problem, as we are not equipped to manage it. You'll likely need to be evaluated by a rheumatologist, as well. Call the Wellness clinic, to help you get a referral to a rheumatologist.   Behcet Syndrome Behcet syndrome is a rare disorder that involves inflammation of blood vessels (vasculitis) throughout your body. This condition usually begins between the ages of 20 years and 40 years. Behcet syndrome can range from mild to severe and is a condition you will have for the rest of your life (chronic). There is no cure, but symptoms may go away on their own for periods of time. It can cause various symptoms, including open sores (ulcers) in your mouth or on your genitals. It can affect many organs and systems in your body, including your heart, lungs, digestive system, and nervous systems. It can sometimes lead to blindness. Behcet syndrome is not spread from person to person (contagious). CAUSES  The exact cause is unknown. The condition tends to run in families. Some genes that increase risk for Behcet syndrome have been identified. If you inherit these genes, it may increase your risk.  RISK FACTORS  Being of Asian, [place], or [place] descent.  Being 37-33 years of age. SYMPTOMS  Signs and symptoms vary depending on the areas of the body that are affected. Early and common signs and symptoms include:   Open sores on your:  Mouth. These may look like canker sores. The sores may come and go.  Genitals. These may come and go and leave scars when they heal.  Skin. These may appear as painful red bumps or pimples.  Eye problems including:  Redness.  Blurred vision.  Tearing.  Pain.  Inflammation  (uveitis).  Arthritis.   Swelling of the brain and spinal cord (meningoencephalitis). Less common signs and symptoms may develop over time and can include:   Abdominal pain and bleeding in your digestive system.  Memory loss.  Behavior changes.  Loss of interest in things you enjoy (apathy).  Seizures.  Blood clots.  Weakening of blood vessels (aneurysms).  Chest pain.  Trouble breathing.  Impaired speech, balance, and movement. DIAGNOSIS  Behcet syndrome is hard to diagnose because there will be times when you are symptom free. Your health care provider may diagnose the condition based on your medical history and a physical exam. The main factors that help confirm the diagnosis are presence of mouth sores at least three times in 1 year and any two of the following:  Genital sores that keep coming back.  Eye inflammation with loss of vision.  Skin sores that are characteristic of Behcet syndrome.  A positive skin prick test. If you have the condition, a skin prick may produce a red bump in 1-2 days. Yourhealth care provider may perform additional tests, including:   CT or MRI scans of your joints, brain, or bones.  Blood vessel studies (angiogram).  Removing pieces of affected body tissue (biopsy) to check for vasculitis. TREATMENT  There is no cure for Behcet syndrome. Treatment typically focuses on relieving your discomfort and preventing serious complications. You may need to work with a team of health care providers because many different parts of your body may be involved. Common treatments include:  Strong anti-inflammatory  medicines (corticosteroids).  Medicines that suppress your immune system and treat inflammation.  Steroid creams to treat oral and genital ulcers.  Other medicines your health care team may recommend based on your symptoms and the parts of your body involved. HOME CARE INSTRUCTIONS Follow all your health care provider's instructions.  Theinstructions you get will depend on your specific symptoms and treatments. General instructions may include:  Get plenty of rest, especially when your symptoms worsen.  Get moderate exercise (walking and swimming) when not experiencing worsening of symptoms.  Include lots of vegetables, fruits, and whole grains in your diet.  Avoid high-fat foods.  Do not smoke.  Keep all follow-up appointments. SEEK MEDICAL CARE IF:  Your symptoms worsen and are not managed by medicines and home care. SEEK IMMEDIATE MEDICAL CARE IF:  You suddenly lose your vision.  You vomit blood or have blood in your stool.  You have very bad abdominal pain.  You suddenly have a very bad headache.  You have a seizure.  You have a red, warm, or tender swelling in your leg.  You have chest pain or trouble breathing. FOR MORE INFORMATION American Behcet's Disease Association: www.behcets.com   This information is not intended to replace advice given to you by your health care provider. Make sure you discuss any questions you have with your health care provider.   Document Released: 09/15/2002 Document Revised: 09/30/2013 Document Reviewed: 08/26/2013 Elsevier Interactive Patient Education [employer]

## 2015-12-18 ENCOUNTER — Emergency Department (HOSPITAL_COMMUNITY)
Admission: EM | Admit: 2015-12-18 | Discharge: 2015-12-18 | Disposition: A | Discharge status: Home / Self Care | Payer: Medicaid Other | Attending: Emergency Medicine | Admitting: Emergency Medicine

## 2015-12-18 ENCOUNTER — Encounter (HOSPITAL_COMMUNITY): Payer: Self-pay | Admitting: *Deleted

## 2015-12-18 DIAGNOSIS — Z8739 Personal history of other diseases of the musculoskeletal system and connective tissue: Secondary | ICD-10-CM | POA: Diagnosis not present

## 2015-12-18 DIAGNOSIS — Z8619 Personal history of other infectious and parasitic diseases: Secondary | ICD-10-CM | POA: Diagnosis not present

## 2015-12-18 DIAGNOSIS — F17210 Nicotine dependence, cigarettes, uncomplicated: Secondary | ICD-10-CM | POA: Diagnosis not present

## 2015-12-18 DIAGNOSIS — K121 Other forms of stomatitis: Secondary | ICD-10-CM | POA: Diagnosis not present

## 2015-12-18 DIAGNOSIS — Z8614 Personal history of Methicillin resistant Staphylococcus aureus infection: Secondary | ICD-10-CM | POA: Diagnosis not present

## 2015-12-18 DIAGNOSIS — K1379 Other lesions of oral mucosa: Secondary | ICD-10-CM | POA: Diagnosis present

## 2015-12-18 DIAGNOSIS — K1230 Oral mucositis (ulcerative), unspecified: Secondary | ICD-10-CM | POA: Diagnosis not present

## 2015-12-18 MED ORDER — MAGIC MOUTHWASH: 5.0000 mL | Freq: Four times a day (QID) | ORAL | Status: DC | PRN

## 2015-12-18 MED ORDER — PREDNISONE 20 MG PO TABS: 40.0000 mg | ORAL_TABLET | Freq: Every day | ORAL | Status: DC

## 2015-12-18 MED ORDER — PREDNISONE 20 MG PO TABS
60.0000 mg | ORAL_TABLET | Freq: Once | ORAL | Status: AC
  Administered 2015-12-18: 60 mg via ORAL
  Filled 2015-12-18: qty 3

## 2015-12-18 NOTE — Discharge Instructions (Signed)
Stomatitis  Stomatitis is a condition that causes inflammation in your mouth. It can affect a part of your mouth or your whole mouth. The condition often affects your cheek, teeth, gums, lips, and tongue. Stomatitis can also affect the mucous membranes that surround your mouth (mucosa).  Pain from stomatitis can make it hard for you to eat or drink. Severe cases of this condition can lead to dehydration or poor nutrition.  CAUSES  Common causes of this condition include:  · Viruses, such as cold sores or oral herpes and shingles.  · Canker sores.  · Bacterial infections.  · Fungus or yeast infections, such as oral thrush.  · Not getting adequate nutrition.  · Injury to your mouth. This can be from:    Dentures or braces that do not fit well.    Biting your tongue or cheek.    Burning your mouth.    Having sharp or broken teeth.  · Gum disease.  · Using tobacco, especially chewing tobacco.  · Allergies to foods, medicines, or substances that are used in your mouth.  · Medicines, including cancer medicines (chemotherapy), antihistamines, and seizure medicines.  In some cases, the cause may not be known.  RISK FACTORS  This condition is more likely to develop in people who:  · Have poor oral hygiene or poor nutrition.  · Have any condition that causes a dry mouth.  · Are under a lot of physical or emotional stress.  · Have any condition that weakens the body's defense system (immune system).  · Are being treated for cancer.  · Smoke.  SYMPTOMS  The most common symptoms of this condition are pain, swelling, and redness inside your mouth. The pain may feel like burning or stinging. It may get worse from eating or drinking. Other symptoms include:  · Painful, shallow sores (ulcers) in the mouth.  · Blisters in the mouth.  · Bleeding gums.  · Swollen gums.  · Irritability and fatigue.  · Bad breath.  · Bad taste in the mouth.  · Fever.  DIAGNOSIS  This condition is diagnosed with a physical exam to check for bleeding gums  and mouth ulcers. You may also have other tests, including:  · Blood tests to look for infection or vitamin deficiencies.  · Mouth swab to get a fluid sample to test for bacteria (culture).  · Tissue sample from an ulcer to examine under a microscope (biopsy).  TREATMENT  Treatment for stomatitis depends on the cause. Treatment may include medicines, such as:  · Over-the counter (OTC) pain medicines.  · Topical anesthetic to numb the area if you have severe pain.  · Antibiotics to treat a bacterial infection.  · Antifungals to treat a fungal infection.  · Antivirals to treat a viral infection.  · Mouth rinses that contain steroids to reduce the swelling in your mouth.  · Other medicines to coat or numb your mouth.  HOME CARE INSTRUCTIONS  Medicines  · Take medicines only as directed by your health care provider.  · If you were prescribed an antibiotic, finish all of it even if you start to feel better.  Lifestyle  · Practice good oral hygiene:    Gently brush your teeth with a soft, nylon-bristled toothbrush two times each day.    Floss your teeth every day.    Have your teeth cleaned regularly, as recommended by your dentist.  · Eat a balanced diet. Do not eat:    Spicy foods.      Citrus, such as oranges.    Foods that have sharp edges, such as chips.  · Avoid any foods or other allergens that you think may be causing your stomatitis.  · If you have dentures, make sure that they are properly fitted.  · Do not use any tobacco products, including cigarettes, chewing tobacco, or electronic cigarettes. If you need help quitting, ask your health care provider.  · Find ways to reduce stress. Try yoga or meditation. Ask your health care provider for other ideas.  General Instructions  · Use a salt-water rinse for pain as directed by your health care provider. Mix 1 tsp of salt in 2 cups of water.  · Drink enough fluid to keep your urine clear or pale yellow. This will keep you hydrated.  SEEK MEDICAL CARE IF:  · Your  symptoms get worse.  · You develop new symptoms, especially:    A rash.    New symptoms that do not involve your mouth area.  · Your symptoms last longer than three weeks.  · Your stomatitis goes away and then returns.  · You have a harder time eating and drinking normally.  · You have increasing fatigue or weakness.  · You lose your appetite or you feel nauseous.  · You have a fever.     This information is not intended to replace advice given to you by your health care provider. Make sure you discuss any questions you have with your health care provider.     Document Released: 07/23/2007 Document Revised: 02/09/2015 Document Reviewed: 09/21/2014  Elsevier Interactive Patient Education ©2016 [employer] Inc[employer]

## 2015-12-18 NOTE — ED Notes (Signed)
Pt presents w/ swelling to his tongue x3 days, admits to hx of behcets that has caused the same - pt w/ difficulty speaking due to swelling, posterior soft palate w/o edema noted on assessment - tongue pink and swollen, pt handling secretions well, no acute distress.

## 2015-12-18 NOTE — ED Provider Notes (Signed)
CSN: [id number]     Arrival date & time 12/18/15  [id number] History   First MD Initiated Contact with Patient 12/18/15 1056     Chief Complaint  Patient presents with  . Oral Swelling     (Consider location/radiation/quality/duration/timing/severity/associated sxs/prior Treatment) HPI Comments: Patient here complaining of 3 days of ulcers to his tongue. History of bechets disease and this is similar. Seen here a month ago for similar symptoms and was prescribed prednisone which did help. Has scheduled appointment next week to be seen by rheumatology. Denies any fever. Cipro as well as secretions. Symptoms are worse with eating. Nothing makes them better  The history is provided by the patient.    Past Medical History  Diagnosis Date  . Behcet's disease (HCC)   . MRSA (methicillin resistant staph aureus) culture positive   . Herpes   . Stomatitis and mucositis   . Recurrent mouth ulceration     "since 25 years old"   Past Surgical History  Procedure Laterality Date  . Biopsy pharynx     Family History  Problem Relation Age of Onset  . Heart disease Mother   . Lupus Neg Hx   . Crohn's disease Neg Hx    Social History  Substance Use Topics  . Smoking status: Current Every Day Smoker -- 0.25 packs/day    Types: Cigarettes  . Smokeless tobacco: Never Used  . Alcohol Use: No     Comment: occ    Review of Systems  All other systems reviewed and are negative.     Allergies  Review of patient's allergies indicates no known allergies.  Home Medications   Prior to Admission medications   Medication Sig Start Date End Date Taking? Authorizing Provider  magic mouthwash SOLN Take 5 mLs by mouth 4 (four) times daily as needed for mouth pain. 12/18/15   Lorre NickAnthony Hill Mackie, MD  predniSONE (DELTASONE) 20 MG tablet Take 2 tablets (40 mg total) by mouth daily. 12/18/15   Lorre NickAnthony Taci Sterling, MD   BP 126/97 mmHg  Pulse 75  Temp(Src) 97.5 F (36.4 C)  Resp 16  SpO2 99% Physical Exam    Constitutional: He is oriented to person, place, and time. He appears well-developed and well-nourished.  Non-toxic appearance. No distress.  HENT:  Head: Normocephalic and atraumatic.  Mouth/Throat:    Eyes: Conjunctivae, EOM and lids are normal. Pupils are equal, round, and reactive to light.  Neck: Normal range of motion. Neck supple. No tracheal deviation present. No thyroid mass present.  Cardiovascular: Normal rate, regular rhythm and normal heart sounds.  Exam reveals no gallop.   No murmur heard. Pulmonary/Chest: Effort normal and breath sounds normal. No stridor. No respiratory distress. He has no decreased breath sounds. He has no wheezes. He has no rhonchi. He has no rales.  Abdominal: Soft. Normal appearance and bowel sounds are normal. He exhibits no distension. There is no tenderness. There is no rebound and no CVA tenderness.  Musculoskeletal: Normal range of motion. He exhibits no edema or tenderness.  Neurological: He is alert and oriented to person, place, and time. He has normal strength. No cranial nerve deficit or sensory deficit. GCS eye subscore is 4. GCS verbal subscore is 5. GCS motor subscore is 6.  Skin: Skin is warm and dry. No abrasion and no rash noted.  Psychiatric: He has a normal mood and affect. His speech is normal and behavior is normal.  Nursing note and vitals reviewed.   ED Course  Procedures (including critical  care time) Labs Review Labs Reviewed - No data to display  Imaging Review No results found. I have personally reviewed and evaluated these images and lab results as part of my medical decision-making.   EKG Interpretation None      MDM   Final diagnoses:  Stomatitis and mucositis    Patient given prednisone here as well as steroid burst and was instructed to keep his follow-up appointment with his physician    Lorre Nick, MD 12/18/15 1124

## 2015-12-23 DIAGNOSIS — N5089 Other specified disorders of the male genital organs: Secondary | ICD-10-CM | POA: Diagnosis not present

## 2015-12-23 DIAGNOSIS — K121 Other forms of stomatitis: Secondary | ICD-10-CM | POA: Diagnosis not present

## 2016-02-27 ENCOUNTER — Emergency Department (HOSPITAL_BASED_OUTPATIENT_CLINIC_OR_DEPARTMENT_OTHER)
Admission: EM | Admit: 2016-02-27 | Discharge: 2016-02-27 | Disposition: A | Discharge status: Home / Self Care | Payer: Medicaid Other | Attending: Emergency Medicine | Admitting: Emergency Medicine

## 2016-02-27 ENCOUNTER — Encounter (HOSPITAL_BASED_OUTPATIENT_CLINIC_OR_DEPARTMENT_OTHER): Payer: Self-pay

## 2016-02-27 DIAGNOSIS — R059 Cough, unspecified: Secondary | ICD-10-CM

## 2016-02-27 DIAGNOSIS — K1379 Other lesions of oral mucosa: Secondary | ICD-10-CM | POA: Diagnosis present

## 2016-02-27 DIAGNOSIS — K5900 Constipation, unspecified: Secondary | ICD-10-CM | POA: Insufficient documentation

## 2016-02-27 DIAGNOSIS — F17210 Nicotine dependence, cigarettes, uncomplicated: Secondary | ICD-10-CM | POA: Insufficient documentation

## 2016-02-27 DIAGNOSIS — R05 Cough: Secondary | ICD-10-CM | POA: Diagnosis not present

## 2016-02-27 DIAGNOSIS — M352 Behcet's disease: Secondary | ICD-10-CM | POA: Diagnosis not present

## 2016-02-27 MED ORDER — BENZONATATE 100 MG PO CAPS: 100.0000 mg | ORAL_CAPSULE | Freq: Three times a day (TID) | ORAL | Status: DC

## 2016-02-27 MED ORDER — MAGIC MOUTHWASH: 5.0000 mL | Freq: Four times a day (QID) | ORAL | Status: DC | PRN

## 2016-02-27 MED ORDER — DOCUSATE SODIUM 100 MG PO CAPS: 100.0000 mg | ORAL_CAPSULE | Freq: Every day | ORAL | Status: DC

## 2016-02-27 MED ORDER — PREDNISONE 20 MG PO TABS: 60.0000 mg | ORAL_TABLET | Freq: Every day | ORAL | Status: DC

## 2016-02-27 MED ORDER — PREDNISONE 50 MG PO TABS
60.0000 mg | ORAL_TABLET | Freq: Once | ORAL | Status: AC
  Administered 2016-02-27: 60 mg via ORAL
  Filled 2016-02-27: qty 1

## 2016-02-27 NOTE — ED Provider Notes (Signed)
CSN: [id number]     Arrival date & time 02/27/16  1804 History  By signing my name below, I, Ronney Lion, attest that this documentation has been prepared under the direction and in the presence of Buel Ream, PA-C. Electronically Signed: Ronney Lion, ED Scribe. 02/27/2016. 9:07 PM.    Chief Complaint  Patient presents with  . Mouth Lesions   The history is provided by the patient. No language interpreter was used.   HPI Comments: Paul House is a 24 y.o. male with a history of Behcet's disease, MRSA, herpes, stomatitis and mucositis, and recurrent mouth ulcerations, who presents to the Emergency Department complaining of recurrent, constant, worsening, painful, oral sores than began 1 week ago. He states these are typical of his usual mouth ulcerations due to Behcet's. Patient states he has taken colchicine for his sores, as well as prednisone, which have both been effective in the past. Swallowing exacerbates his pain. Patient states he has had an improving URI for the past week, with an improving productive cough, which has also exacerbated his pain. He denies chest pain, difficulty swallowing, difficulty breathing, abdominal pain, nausea, vomiting, dysuria, penile discharge, hematochezia, or melena. He states he last saw his rheumatologist 2 months ago.   Patient states 2 weeks ago, he has had constipation, and has only been able to push out small pieces of stool at a time. Patient reports he had tried eating fiber, with no relief.   Past Medical History  Diagnosis Date  . Behcet's disease (HCC)   . MRSA (methicillin resistant staph aureus) culture positive   . Herpes   . Stomatitis and mucositis   . Recurrent mouth ulceration     "since 25 years old"   Past Surgical History  Procedure Laterality Date  . Biopsy pharynx     Family History  Problem Relation Age of Onset  . Heart disease Mother   . Lupus Neg Hx   . Crohn's disease Neg Hx    Social History  Substance Use  Topics  . Smoking status: Current Every Day Smoker -- 0.25 packs/day    Types: Cigarettes  . Smokeless tobacco: Never Used  . Alcohol Use: No     Comment: occ    Review of Systems  Constitutional: Negative for fever and chills.  HENT: Positive for mouth sores. Negative for facial swelling, sore throat and trouble swallowing.   Respiratory: Negative for shortness of breath.   Cardiovascular: Negative for chest pain.  Gastrointestinal: Positive for constipation. Negative for nausea, vomiting, abdominal pain and blood in stool.  Genitourinary: Negative for dysuria and discharge.  Musculoskeletal: Negative for back pain.  Skin: Negative for rash and wound.  Neurological: Negative for headaches.  Psychiatric/Behavioral: The patient is not nervous/anxious.       Allergies  Review of patient's allergies indicates no known allergies.  Home Medications   Prior to Admission medications   Medication Sig Start Date End Date Taking? Authorizing Provider  benzonatate (TESSALON) 100 MG capsule Take 1 capsule (100 mg total) by mouth every 8 (eight) hours. 02/27/16   Emi Holes, PA-C  docusate sodium (COLACE) 100 MG capsule Take 1 capsule (100 mg total) by mouth daily. 02/27/16   Emi Holes, PA-C  magic mouthwash SOLN Take 5 mLs by mouth 4 (four) times daily as needed for mouth pain. 02/27/16   Emi Holes, PA-C  predniSONE (DELTASONE) 20 MG tablet Take 3 tablets (60 mg total) by mouth daily with breakfast. 02/27/16   Gordy Councilman  M Blaise Palladino, PA-C   BP 137/99 mmHg  Pulse 98  Resp 16  Ht 6' (1.829 m)  Wt 72.576 kg  BMI 21.70 kg/m2  SpO2 100% Physical Exam  Constitutional: He appears well-developed and well-nourished. No distress.  HENT:  Head: Normocephalic and atraumatic.  Mouth/Throat: No oropharyngeal exudate.    Multiple sores throughout mouth, as indicated. Patent airway. No tonsillar exudates. Uvula midline. No concern for abscess. Speech altered due to pain and saliva, no  trismus. Patient managing secretions, however increased salivation. No neck pain or tenderness.  Eyes: Conjunctivae are normal. Pupils are equal, round, and reactive to light. Right eye exhibits no discharge. Left eye exhibits no discharge. No scleral icterus.  Neck: Normal range of motion. Neck supple. No thyromegaly present.  Cardiovascular: Normal rate, regular rhythm, normal heart sounds and intact distal pulses.  Exam reveals no gallop and no friction rub.   No murmur heard. Pulmonary/Chest: Effort normal and breath sounds normal. No stridor. No respiratory distress. He has no wheezes. He has no rales.  Lungs are clear to auscultation.   Abdominal: Soft. Bowel sounds are normal. He exhibits no distension. There is no tenderness. There is no rebound and no guarding.  Musculoskeletal: He exhibits no edema.  Lymphadenopathy:    He has no cervical adenopathy.  Neurological: He is alert. Coordination normal.  Skin: Skin is warm and dry. No rash noted. He is not diaphoretic. No pallor.  Psychiatric: He has a normal mood and affect.  Nursing note and vitals reviewed.   ED Course  Procedures (including critical care time)  DIAGNOSTIC STUDIES: Oxygen Saturation is 100% on RA, normal by my interpretation.    COORDINATION OF CARE: 9:01 PM - Discussed treatment plan with pt at bedside which includes Rx prednisone. Will also discharge with stool softener for constipation. Advised follow-up with rheumatologist. Pt verbalized understanding and agreed to plan.   MDM   Patient's symptoms similar to manifestations of Behcet's syndrome in the past. Will treat patient with prednisone, Magic mouth wash. We will treat improving URI symptoms with symptomatic treatment, including Tessalon. We'll treat constipation with Colace. Patient to follow-up with rheumatologist as soon as possible for further evaluation and treatment. Discussed importance to establish care with a PCP. Patient vitals stable and  discharged in satisfactory condition.  Final diagnoses:  Behcet's disease (HCC)  Cough  Constipation, unspecified constipation type    I personally performed the services described in this documentation, which was scribed in my presence. The recorded information has been reviewed and is accurate.     Emi Holeslexandra M Grae Leathers, PA-C 02/27/16 2119  Arby BarretteMarcy Pfeiffer, MD 03/02/16 [phone]

## 2016-02-27 NOTE — ED Notes (Signed)
Pt given d/c instructions as per chart. Rx x 4. Verbalizes understanding. No questions. 

## 2016-02-27 NOTE — Discharge Instructions (Signed)
Medications: Prednisone, Magic mouthwash, Tessalon, Colace  Treatment: Take prednisone as prescribed for 5 days. Take Magic mouthwash up to 4 times daily for your mouth pain. Take Tessalon every 8 hours as needed for cough. Take 1 Colace daily for your constipation. Increase your fluid intake.  Follow-up: Please follow-up with your rheumatologist as soon as possible for further evaluation and treatment of your symptoms. Please follow-up and establish care with a primary care provider calling the number circled on your discharge paperwork. Please return to emergency department if you develop any new or worsening symptoms.   Behcet Syndrome Behcet syndrome is a rare disorder that involves inflammation of blood vessels (vasculitis) throughout your body. This condition usually begins between the ages of 20 years and 40 years. Behcet syndrome can range from mild to severe and is a condition you will have for the rest of your life (chronic). There is no cure, but symptoms may go away on their own for periods of time. It can cause various symptoms, including open sores (ulcers) in your mouth or on your genitals. It can affect many organs and systems in your body, including your heart, lungs, digestive system, and nervous systems. It can sometimes lead to blindness. Behcet syndrome is not spread from person to person (contagious). CAUSES  The exact cause is unknown. The condition tends to run in families. Some genes that increase risk for Behcet syndrome have been identified. If you inherit these genes, it may increase your risk.  RISK FACTORS  Being of Asian, Middle [place], or [place] descent.  Being 2520-640 years of age. SYMPTOMS  Signs and symptoms vary depending on the areas of the body that are affected. Early and common signs and symptoms include:   Open sores on your:  Mouth. These may look like canker sores. The sores may come and go.  Genitals. These may come and go and leave scars when they  heal.  Skin. These may appear as painful red bumps or pimples.  Eye problems including:  Redness.  Blurred vision.  Tearing.  Pain.  Inflammation (uveitis).  Arthritis.   Swelling of the brain and spinal cord (meningoencephalitis). Less common signs and symptoms may develop over time and can include:   Abdominal pain and bleeding in your digestive system.  Memory loss.  Behavior changes.  Loss of interest in things you enjoy (apathy).  Seizures.  Blood clots.  Weakening of blood vessels (aneurysms).  Chest pain.  Trouble breathing.  Impaired speech, balance, and movement. DIAGNOSIS  Behcet syndrome is hard to diagnose because there will be times when you are symptom free. Your health care provider may diagnose the condition based on your medical history and a physical exam. The main factors that help confirm the diagnosis are presence of mouth sores at least three times in 1 year and any two of the following:  Genital sores that keep coming back.  Eye inflammation with loss of vision.  Skin sores that are characteristic of Behcet syndrome.  A positive skin prick test. If you have the condition, a skin prick may produce a red bump in 1-2 days. Yourhealth care provider may perform additional tests, including:   CT or MRI scans of your joints, brain, or bones.  Blood vessel studies (angiogram).  Removing pieces of affected body tissue (biopsy) to check for vasculitis. TREATMENT  There is no cure for Behcet syndrome. Treatment typically focuses on relieving your discomfort and preventing serious complications. You may need to work with a team of  health care providers because many different parts of your body may be involved. Common treatments include:  Strong anti-inflammatory medicines (corticosteroids).  Medicines that suppress your immune system and treat inflammation.  Steroid creams to treat oral and genital ulcers.  Other medicines your health  care team may recommend based on your symptoms and the parts of your body involved. HOME CARE INSTRUCTIONS Follow all your health care provider's instructions. Theinstructions you get will depend on your specific symptoms and treatments. General instructions may include:  Get plenty of rest, especially when your symptoms worsen.  Get moderate exercise (walking and swimming) when not experiencing worsening of symptoms.  Include lots of vegetables, fruits, and whole grains in your diet.  Avoid high-fat foods.  Do not smoke.  Keep all follow-up appointments. SEEK MEDICAL CARE IF:  Your symptoms worsen and are not managed by medicines and home care. SEEK IMMEDIATE MEDICAL CARE IF:  You suddenly lose your vision.  You vomit blood or have blood in your stool.  You have very bad abdominal pain.  You suddenly have a very bad headache.  You have a seizure.  You have a red, warm, or tender swelling in your leg.  You have chest pain or trouble breathing. FOR MORE INFORMATION American Behcet's Disease Association: www.behcets.com   This information is not intended to replace advice given to you by your health care provider. Make sure you discuss any questions you have with your health care provider.   Document Released: 09/15/2002 Document Revised: 09/30/2013 Document Reviewed: 08/26/2013 Elsevier Interactive Patient Education 2016 [employer].  Constipation, Adult Constipation is when a person has fewer than three bowel movements a week, has difficulty having a bowel movement, or has stools that are dry, hard, or larger than normal. As people grow older, constipation is more common. A low-fiber diet, not taking in enough fluids, and taking certain medicines may make constipation worse.  CAUSES   Certain medicines, such as antidepressants, pain medicine, iron supplements, antacids, and water pills.   Certain diseases, such as diabetes, irritable bowel syndrome (IBS), thyroid  disease, or depression.   Not drinking enough water.   Not eating enough fiber-rich foods.   Stress or travel.   Lack of physical activity or exercise.   Ignoring the urge to have a bowel movement.   Using laxatives too much.  SIGNS AND SYMPTOMS   Having fewer than three bowel movements a week.   Straining to have a bowel movement.   Having stools that are hard, dry, or larger than normal.   Feeling full or bloated.   Pain in the lower abdomen.   Not feeling relief after having a bowel movement.  DIAGNOSIS  Your health care provider will take a medical history and perform a physical exam. Further testing may be done for severe constipation. Some tests may include:  A barium enema X-ray to examine your rectum, colon, and, sometimes, your small intestine.   A sigmoidoscopy to examine your lower colon.   A colonoscopy to examine your entire colon. TREATMENT  Treatment will depend on the severity of your constipation and what is causing it. Some dietary treatments include drinking more fluids and eating more fiber-rich foods. Lifestyle treatments may include regular exercise. If these diet and lifestyle recommendations do not help, your health care provider may recommend taking over-the-counter laxative medicines to help you have bowel movements. Prescription medicines may be prescribed if over-the-counter medicines do not work.  HOME CARE INSTRUCTIONS   Eat foods that have  a lot of fiber, such as fruits, vegetables, whole grains, and beans.  Limit foods high in fat and processed sugars, such as french fries, hamburgers, cookies, candies, and soda.   A fiber supplement may be added to your diet if you cannot get enough fiber from foods.   Drink enough fluids to keep your urine clear or pale yellow.   Exercise regularly or as directed by your health care provider.   Go to the restroom when you have the urge to go. Do not hold it.   Only take  over-the-counter or prescription medicines as directed by your health care provider. Do not take other medicines for constipation without talking to your health care provider first.  SEEK IMMEDIATE MEDICAL CARE IF:   You have bright red blood in your stool.   Your constipation lasts for more than 4 days or gets worse.   You have abdominal or rectal pain.   You have thin, pencil-like stools.   You have unexplained weight loss. MAKE SURE YOU:   Understand these instructions.  Will watch your condition.  Will get help right away if you are not doing well or get worse.   This information is not intended to replace advice given to you by your health care provider. Make sure you discuss any questions you have with your health care provider.   Document Released: 06/23/2004 Document Revised: 10/16/2014 Document Reviewed: 07/07/2013 Elsevier Interactive Patient Education 2016 Elsevier Inc.  High-Fiber Diet Fiber, also called dietary fiber, is a type of carbohydrate found in fruits, vegetables, whole grains, and beans. A high-fiber diet can have many health benefits. Your health care provider may recommend a high-fiber diet to help:  Prevent constipation. Fiber can make your bowel movements more regular.  Lower your cholesterol.  Relieve hemorrhoids, uncomplicated diverticulosis, or irritable bowel syndrome.  Prevent overeating as part of a weight-loss plan.  Prevent heart disease, type 2 diabetes, and certain cancers. WHAT IS MY PLAN? The recommended daily intake of fiber includes:  38 grams for men under age 75.  30 grams for men over age 76.  25 grams for women under age 60.  21 grams for women over age 10. You can get the recommended daily intake of dietary fiber by eating a variety of fruits, vegetables, grains, and beans. Your health care provider may also recommend a fiber supplement if it is not possible to get enough fiber through your diet. WHAT DO I NEED TO KNOW  ABOUT A HIGH-FIBER DIET?  Fiber supplements have not been widely studied for their effectiveness, so it is better to get fiber through food sources.  Always check the fiber content on thenutrition facts label of any prepackaged food. Look for foods that contain at least 5 grams of fiber per serving.  Ask your dietitian if you have questions about specific foods that are related to your condition, especially if those foods are not listed in the following section.  Increase your daily fiber consumption gradually. Increasing your intake of dietary fiber too quickly may cause bloating, cramping, or gas.  Drink plenty of water. Water helps you to digest fiber. WHAT FOODS CAN I EAT? Grains Whole-grain breads. Multigrain cereal. Oats and oatmeal. Brown rice. Barley. Bulgur wheat. Millet. Bran muffins. Popcorn. Rye wafer crackers. Vegetables Sweet potatoes. Spinach. Kale. Artichokes. Cabbage. Broccoli. Green peas. Carrots. Squash. Fruits Berries. Pears. Apples. Oranges. Avocados. Prunes and raisins. Dried figs. Meats and Other Protein Sources Navy, kidney, pinto, and soy beans. Split peas. Lentils. Nuts  and seeds. Dairy Fiber-fortified yogurt. Beverages Fiber-fortified soy milk. Fiber-fortified orange juice. Other Fiber bars. The items listed above may not be a complete list of recommended foods or beverages. Contact your dietitian for more options. WHAT FOODS ARE NOT RECOMMENDED? Grains White bread. Pasta made with refined flour. White rice. Vegetables Fried potatoes. Canned vegetables. Well-cooked vegetables.  Fruits Fruit juice. Cooked, strained fruit. Meats and Other Protein Sources Fatty cuts of meat. Fried Environmental education officer or fried fish. Dairy Milk. Yogurt. Cream cheese. Sour cream. Beverages Soft drinks. Other Cakes and pastries. Butter and oils. The items listed above may not be a complete list of foods and beverages to avoid. Contact your dietitian for more information. WHAT ARE  SOME TIPS FOR INCLUDING HIGH-FIBER FOODS IN MY DIET?  Eat a wide variety of high-fiber foods.  Make sure that half of all grains consumed each day are whole grains.  Replace breads and cereals made from refined flour or white flour with whole-grain breads and cereals.  Replace white rice with brown rice, bulgur wheat, or millet.  Start the day with a breakfast that is high in fiber, such as a cereal that contains at least 5 grams of fiber per serving.  Use beans in place of meat in soups, salads, or pasta.  Eat high-fiber snacks, such as berries, raw vegetables, nuts, or popcorn.   This information is not intended to replace advice given to you by your health care provider. Make sure you discuss any questions you have with your health care provider.   Document Released: 09/25/2005 Document Revised: 10/16/2014 Document Reviewed: 03/10/2014 Elsevier Interactive Patient Education [employer]

## 2016-02-27 NOTE — ED Notes (Signed)
Pt alert, NAD, calm, interactive, resps e/u, no dyspnea noted, texting on phone, speech alterred, grimace with swallowing, handling secretions, "last prednisone last week, but did not finish and regimen interrupted, use to need prednisone ~ 4x per year, now every month".

## 2016-02-27 NOTE — ED Notes (Signed)
Pt reports having Behcet's. Sts sores in mouth x 1 week. Sts he missed some of this medication. Reports he usually needs a prednisone pack.

## 2016-04-06 ENCOUNTER — Encounter (HOSPITAL_COMMUNITY): Payer: Self-pay | Admitting: Emergency Medicine

## 2016-04-06 ENCOUNTER — Emergency Department (HOSPITAL_COMMUNITY)
Admission: EM | Admit: 2016-04-06 | Discharge: 2016-04-06 | Disposition: A | Discharge status: Home / Self Care | Payer: Medicaid Other | Attending: Emergency Medicine | Admitting: Emergency Medicine

## 2016-04-06 DIAGNOSIS — F17210 Nicotine dependence, cigarettes, uncomplicated: Secondary | ICD-10-CM | POA: Diagnosis not present

## 2016-04-06 DIAGNOSIS — K1370 Unspecified lesions of oral mucosa: Secondary | ICD-10-CM

## 2016-04-06 DIAGNOSIS — K1379 Other lesions of oral mucosa: Secondary | ICD-10-CM | POA: Diagnosis not present

## 2016-04-06 MED ORDER — PREDNISONE 20 MG PO TABS: 40.0000 mg | ORAL_TABLET | Freq: Every day | ORAL | Status: DC

## 2016-04-06 NOTE — ED Notes (Signed)
Pt sts missed some of his meds for autoimmune disorder and now has stomatitis; hx of same and sts normally takes steroids for flare ups

## 2016-04-06 NOTE — ED Provider Notes (Signed)
CSN: [id number]     Arrival date & time 04/06/16  [id number] History   First MD Initiated Contact with Patient 04/06/16 [phone]     Chief Complaint  Patient presents with  . Mouth Lesions     (Consider location/radiation/quality/duration/timing/severity/associated sxs/prior Treatment) HPI Comments: Patient presents to the emergency department with chief complaint of Behcet's disease and recurrent stomatitis and mucositis. He states that he has recurrent mouth ulcers. He is normally treated with prednisone for this with good relief. He states that he started developing mouth ulcers a day or so ago. Denies any fevers chills. There are no other associated symptoms. There are no modifying factors. Denies any vision changes.  The history is provided by the patient. No language interpreter was used.    Past Medical History  Diagnosis Date  . Behcet's disease (HCC)   . MRSA (methicillin resistant staph aureus) culture positive   . Herpes   . Stomatitis and mucositis   . Recurrent mouth ulceration     "since 25 years old"   Past Surgical History  Procedure Laterality Date  . Biopsy pharynx     Family History  Problem Relation Age of Onset  . Heart disease Mother   . Lupus Neg Hx   . Crohn's disease Neg Hx    Social History  Substance Use Topics  . Smoking status: Current Every Day Smoker -- 0.25 packs/day    Types: Cigarettes  . Smokeless tobacco: Never Used  . Alcohol Use: No     Comment: occ    Review of Systems  HENT:       Oral ulcers  All other systems reviewed and are negative.     Allergies  Review of patient's allergies indicates no known allergies.  Home Medications   Prior to Admission medications   Medication Sig Start Date End Date Taking? Authorizing Provider  benzonatate (TESSALON) 100 MG capsule Take 1 capsule (100 mg total) by mouth every 8 (eight) hours. 02/27/16   Emi HolesAlexandra M Law, PA-C  docusate sodium (COLACE) 100 MG capsule Take 1 capsule (100 mg total) by  mouth daily. 02/27/16   Emi HolesAlexandra M Law, PA-C  magic mouthwash SOLN Take 5 mLs by mouth 4 (four) times daily as needed for mouth pain. 02/27/16   Emi HolesAlexandra M Law, PA-C  predniSONE (DELTASONE) 20 MG tablet Take 3 tablets (60 mg total) by mouth daily with breakfast. 02/27/16   Waylan BogaAlexandra M Law, PA-C   BP 114/95 mmHg  Pulse 89  Temp(Src) 98.7 F (37.1 C) (Oral)  Resp 18  SpO2 100% Physical Exam  Constitutional: He is oriented to person, place, and time. He appears well-developed and well-nourished.  HENT:  Head: Normocephalic and atraumatic.  A few scattered ulcerations of oral mucosa  Eyes: Conjunctivae and EOM are normal.  Neck: Normal range of motion.  Cardiovascular: Normal rate.   Pulmonary/Chest: Effort normal.  Abdominal: He exhibits no distension.  Musculoskeletal: Normal range of motion.  Neurological: He is alert and oriented to person, place, and time.  Skin: Skin is dry.  Psychiatric: He has a normal mood and affect. His behavior is normal. Judgment and thought content normal.  Nursing note and vitals reviewed.   ED Course  Procedures (including critical care time)   MDM   Final diagnoses:  Mouth lesion    Patient with mouth lesions thought to be secondary to Behcet's disease.  Will give prednisone.  Recommend primary care follow-up.    Roxy HorsemanRobert Amesha Bailey, PA-C 04/06/16 0949  Marily MemosJason Mesner,  MD 04/07/16 [id number]

## 2016-04-06 NOTE — Discharge Instructions (Signed)
You need to make an appointment with a PRIMARY CARE DOCTOR for further management of your symptoms.

## 2016-04-23 ENCOUNTER — Encounter (HOSPITAL_COMMUNITY): Payer: Self-pay

## 2016-04-23 ENCOUNTER — Emergency Department (HOSPITAL_COMMUNITY)
Admission: EM | Admit: 2016-04-23 | Discharge: 2016-04-23 | Disposition: A | Discharge status: Home / Self Care | Payer: Medicaid Other | Attending: Emergency Medicine | Admitting: Emergency Medicine

## 2016-04-23 DIAGNOSIS — F17210 Nicotine dependence, cigarettes, uncomplicated: Secondary | ICD-10-CM | POA: Insufficient documentation

## 2016-04-23 DIAGNOSIS — M352 Behcet's disease: Secondary | ICD-10-CM | POA: Diagnosis not present

## 2016-04-23 DIAGNOSIS — K1379 Other lesions of oral mucosa: Secondary | ICD-10-CM

## 2016-04-23 MED ORDER — LIDOCAINE VISCOUS 2 % MT SOLN: 15.0000 mL | OROMUCOSAL | Status: DC | PRN

## 2016-04-23 MED ORDER — PREDNISONE 10 MG (21) PO TBPK: 10.0000 mg | ORAL_TABLET | Freq: Every day | ORAL | Status: DC

## 2016-04-23 NOTE — ED Notes (Signed)
PT DISCHARGED. INSTRUCTIONS AND PRESCRIPTIONS GIVEN. AAOX4. PT IN NO APPARENT DISTRESS. THE OPPORTUNITY TO ASK QUESTIONS WAS PROVIDED. 

## 2016-04-23 NOTE — ED Provider Notes (Signed)
CSN: [id number]     Arrival date & time 04/23/16  1804 History  By signing my name below, I, Paul House, attest that this documentation has been prepared under the direction and in the presence of Paul Rothlisberger, PA-C.  Electronically Signed: Rosario AdieWilliam Andrew House, ED Scribe. 04/23/2016. 7:24 PM.   Chief Complaint  Patient presents with  . Mouth Lesions    X1 WEEK   The history is provided by the patient and medical records. No language interpreter was used.   HPI Comments: Paul House is a 25 y.o. male with a PMHx of Behcet's disease, MRSA, Herpes, and recurrent mouth ulcers who presents to the Emergency Department complaining of gradual onset, recurrent, unchanged mouth sores x 1 week. He states that he has a history of similar mouth sores secondary to Behcet's disease, and he was seen in the ED for similar symptoms ~17 days PTA. At that time, he was d/c'd home with a short course of Prednisone. He has also been taking Colchicine x 1 month. He was compliant with his Prednisone and reports that it did not completely clear up this outbreak, but did clear up it up some. He is currently requesting that he receive more Prednisone today in the ED. Denies fever/chills, nausea/vomiting, and difficulty breathing or swallowing, or any other complaints.  Patient adds that he has not been able to follow-up with his PCP on this matter because he had been taking care of an ill family member.    Past Medical History  Diagnosis Date  . Behcet's disease (HCC)   . MRSA (methicillin resistant staph aureus) culture positive   . Herpes   . Stomatitis and mucositis   . Recurrent mouth ulceration     "since 25 years old"   Past Surgical History  Procedure Laterality Date  . Biopsy pharynx     Family History  Problem Relation Age of Onset  . Heart disease Mother   . Lupus Neg Hx   . Crohn's disease Neg Hx    Social History  Substance Use Topics  . Smoking status: Current Every Day  Smoker -- 0.25 packs/day    Types: Cigarettes  . Smokeless tobacco: Never Used  . Alcohol Use: No     Comment: occ    Review of Systems  Constitutional: Negative for fever and chills.  HENT: Positive for mouth sores.   Respiratory: Negative for cough and shortness of breath.   Gastrointestinal: Negative for nausea and vomiting.   Allergies  Review of patient's allergies indicates no known allergies.  Home Medications   Prior to Admission medications   Medication Sig Start Date End Date Taking? Authorizing Provider  benzonatate (TESSALON) 100 MG capsule Take 1 capsule (100 mg total) by mouth every 8 (eight) hours. 02/27/16   Paul HolesAlexandra M Law, PA-C  docusate sodium (COLACE) 100 MG capsule Take 1 capsule (100 mg total) by mouth daily. 02/27/16   Paul HolesAlexandra M Law, PA-C  lidocaine (XYLOCAINE) 2 % solution Use as directed 15 mLs in the mouth or throat as needed for mouth pain. 04/23/16   Paul Wease C Darrik Richman, PA-C  magic mouthwash SOLN Take 5 mLs by mouth 4 (four) times daily as needed for mouth pain. 02/27/16   Paul HolesAlexandra M Law, PA-C  predniSONE (STERAPRED UNI-PAK 21 TAB) 10 MG (21) TBPK tablet Take 1 tablet (10 mg total) by mouth daily. Take 6 tabs by mouth daily  for 2 days, then 5 tabs for 2 days, then 4 tabs for 2 days,  then 3 tabs for 2 days, 2 tabs for 2 days, then 1 tab by mouth daily for 2 days 04/23/16   Paul Southall C Mike Hamre, PA-C   BP 118/84 mmHg  Pulse 95  Temp(Src) 98.5 F (36.9 C) (Oral)  Resp 16  Ht 6' (1.829 m)  Wt 72.576 kg  BMI 21.70 kg/m2  SpO2 100%   Physical Exam  Constitutional: He appears well-developed and well-nourished. No distress.  HENT:  Head: Normocephalic and atraumatic.  Multiple ulcerated mouth lesions on the buccal and gingival surfaces throughout the mouth. Obvious pain with talking. No noted lesions in the posterior pharynx. No facial swelling. No cervical lymphadenopathy.   Eyes: Conjunctivae are normal.  Neck: Normal range of motion. Neck supple.  Cardiovascular:  Normal rate and regular rhythm.   Pulmonary/Chest: Effort normal. No respiratory distress.  Abdominal: He exhibits no distension.  Lymphadenopathy:    He has no cervical adenopathy.  Neurological: He is alert.  Skin: Skin is warm and dry. He is not diaphoretic.  Psychiatric: He has a normal mood and affect. His behavior is normal.  Nursing note and vitals reviewed.  ED Course  Procedures (including critical care time)  DIAGNOSTIC STUDIES: Oxygen Saturation is 100% on RA, normal by my interpretation.   COORDINATION OF CARE: 7:24 PM-Discussed next steps with pt including a prescription for Prednisone. Pt verbalized understanding and is agreeable with the plan.    MDM   Final diagnoses:  Mouth sores  Behcet's syndrome (HCC)   Paul House presents with oral lesions secondary to Behcet's disease.  Patient has no systemic symptoms. I believe a tapered dose pack of prednisone is warranted. Patient encouraged to follow-up with his PCP as soon as possible. The patient was given instructions for home care as well as return precautions. Patient voices understanding of these instructions, accepts the plan, and is comfortable with discharge.  I personally performed the services described in this documentation, which was scribed in my presence. The recorded information has been reviewed and is accurate.   Paul Pancoast, PA-C 04/23/16 2217  Paul Fossa, MD 04/24/16 1452

## 2016-04-23 NOTE — Discharge Instructions (Signed)
You have been seen today for mouth sores. Take the prednisone dose pack as prescribed. Use the viscous lidocaine as needed for discomfort. Swish solution in your mouth and then spit it back out. Follow up with the provider who takes care of your Behcet syndrome as soon as possible. Return to ED should symptoms worsen.

## 2016-04-23 NOTE — ED Notes (Signed)
PT C/O RECURRENT MOUTH LESIONS X1 WEEK. TH E PT STATES HE HAS A HX OF BEHCET'S DISEASE. PT STATES HE HAD AN OUTBREAK RECENTLY, BUT THE MEDICATION DID NOT CLEAR THEM UP, AS USUAL. DENIES FEVER.

## 2016-05-23 ENCOUNTER — Emergency Department (HOSPITAL_COMMUNITY)
Admission: EM | Admit: 2016-05-23 | Discharge: 2016-05-23 | Disposition: A | Discharge status: Home / Self Care | Payer: Medicaid Other | Attending: Emergency Medicine | Admitting: Emergency Medicine

## 2016-05-23 ENCOUNTER — Encounter (HOSPITAL_COMMUNITY): Payer: Self-pay | Admitting: Emergency Medicine

## 2016-05-23 DIAGNOSIS — K0889 Other specified disorders of teeth and supporting structures: Secondary | ICD-10-CM | POA: Diagnosis present

## 2016-05-23 DIAGNOSIS — Z791 Long term (current) use of non-steroidal anti-inflammatories (NSAID): Secondary | ICD-10-CM | POA: Diagnosis not present

## 2016-05-23 DIAGNOSIS — K029 Dental caries, unspecified: Secondary | ICD-10-CM | POA: Diagnosis not present

## 2016-05-23 DIAGNOSIS — F17210 Nicotine dependence, cigarettes, uncomplicated: Secondary | ICD-10-CM | POA: Diagnosis not present

## 2016-05-23 MED ORDER — NAPROXEN 500 MG PO TABS
500.0000 mg | ORAL_TABLET | Freq: Once | ORAL | Status: AC
  Administered 2016-05-23: 500 mg via ORAL
  Filled 2016-05-23: qty 1

## 2016-05-23 MED ORDER — NAPROXEN 500 MG PO TABS: 500.0000 mg | ORAL_TABLET | Freq: Two times a day (BID) | ORAL | 0 refills | Status: DC

## 2016-05-23 MED ORDER — PENICILLIN V POTASSIUM 500 MG PO TABS
500.0000 mg | ORAL_TABLET | Freq: Once | ORAL | Status: AC
  Administered 2016-05-23: 500 mg via ORAL
  Filled 2016-05-23: qty 1

## 2016-05-23 MED ORDER — PENICILLIN V POTASSIUM 500 MG PO TABS: 500.0000 mg | ORAL_TABLET | Freq: Four times a day (QID) | ORAL | 0 refills | Status: AC

## 2016-05-23 NOTE — Discharge Instructions (Signed)
Take penicillin as prescribed until finished. Take Naproxen for pain as needed. Follow up with a dentist.

## 2016-05-23 NOTE — ED Triage Notes (Addendum)
Patient c/o right upper dental pain. States he has a cavity and is now having sharp pains. Patient has a dentist appt scheduled for next week. Patient took Hydrocodone for pain at around 2300

## 2016-05-23 NOTE — ED Provider Notes (Signed)
[facility]-EMERGENCY DEPT Provider Note   CSN: [id number] Arrival date & time: 05/23/16  0142    History   Chief Complaint Chief Complaint  Patient presents with  . Dental Pain    right upper    HPI Paul House is a 25 y.o. male.  The history is provided by the patient. No language interpreter was used.  Dental Pain   This is a new problem. The current episode started 3 to 5 hours ago. The problem occurs constantly. The problem has not changed since onset.The pain is moderate. Treatments tried: Norco. The treatment provided mild relief.    Past Medical History:  Diagnosis Date  . Behcet's disease (HCC)   . Herpes   . MRSA (methicillin resistant staph aureus) culture positive   . Recurrent mouth ulceration    "since 25 years old"  . Stomatitis and mucositis     Patient Active Problem List   Diagnosis Date Noted  . Behcet's disease (HCC) 09/27/2013  . Stomatitis and mucositis 03/15/2013  . Aphthous ulcer of mouth 03/15/2013  . Hypokalemia 03/12/2013  . Protein-calorie malnutrition, severe (HCC) 03/12/2013  . Acute kidney injury (HCC) 07/08/2012  . Hyperglycemia 07/08/2012  . Cigarette smoker 07/08/2012    Past Surgical History:  Procedure Laterality Date  . BIOPSY PHARYNX         Home Medications    Prior to Admission medications   Medication Sig Start Date End Date Taking? Authorizing Provider  benzonatate (TESSALON) 100 MG capsule Take 1 capsule (100 mg total) by mouth every 8 (eight) hours. 02/27/16   Emi HolesAlexandra M Law, PA-C  docusate sodium (COLACE) 100 MG capsule Take 1 capsule (100 mg total) by mouth daily. 02/27/16   Emi HolesAlexandra M Law, PA-C  lidocaine (XYLOCAINE) 2 % solution Use as directed 15 mLs in the mouth or throat as needed for mouth pain. 04/23/16   Shawn C Joy, PA-C  magic mouthwash SOLN Take 5 mLs by mouth 4 (four) times daily as needed for mouth pain. 02/27/16   Emi HolesAlexandra M Law, PA-C  naproxen (NAPROSYN) 500 MG tablet Take 1 tablet (500 mg  total) by mouth 2 (two) times daily. 05/23/16   Antony MaduraKelly Yovanna Cogan, PA-C  penicillin v potassium (VEETID) 500 MG tablet Take 1 tablet (500 mg total) by mouth 4 (four) times daily. 05/23/16 05/30/16  Antony MaduraKelly Berk Pilot, PA-C  predniSONE (STERAPRED UNI-PAK 21 TAB) 10 MG (21) TBPK tablet Take 1 tablet (10 mg total) by mouth daily. Take 6 tabs by mouth daily  for 2 days, then 5 tabs for 2 days, then 4 tabs for 2 days, then 3 tabs for 2 days, 2 tabs for 2 days, then 1 tab by mouth daily for 2 days 04/23/16   Anselm PancoastShawn C Joy, PA-C    Family History Family History  Problem Relation Age of Onset  . Heart disease Mother   . Lupus Neg Hx   . Crohn's disease Neg Hx     Social History Social History  Substance Use Topics  . Smoking status: Current Every Day Smoker    Packs/day: 1.00    Types: Cigarettes  . Smokeless tobacco: Never Used  . Alcohol use No     Comment: occ     Allergies   Review of patient's allergies indicates no known allergies.   Review of Systems Review of Systems  Constitutional: Negative for fever.  HENT: Positive for dental problem. Negative for facial swelling.   Ten systems reviewed and are negative for acute change, except as  noted in the HPI.     Physical Exam Updated Vital Signs BP 142/98 (BP Location: Left Arm)   Pulse 75   Temp 98.5 F (36.9 C) (Oral)   Resp 18   Ht 6\' 3"  (1.905 m)   Wt 72.6 kg   SpO2 99%   BMI 20.00 kg/m   Physical Exam  Constitutional: He is oriented to person, place, and time. He appears well-developed and well-nourished. No distress.  Nontoxic appearing. Sleeping on initial presentation.  HENT:  Head: Normocephalic and atraumatic.  Mouth/Throat: Oropharynx is clear and moist. No trismus in the jaw. Abnormal dentition. Dental caries present. No dental abscesses.    Multiple dental caries and decay. No trismus. Uvula midline. Patient tolerating secretions without difficulty. No gingival fluctuance.  Eyes: Conjunctivae and EOM are normal. No  scleral icterus.  Neck: Normal range of motion.  No nuchal rigidity or meningismus  Pulmonary/Chest: Effort normal. No respiratory distress.  Respirations even and unlabored  Musculoskeletal: Normal range of motion.  Neurological: He is alert and oriented to person, place, and time.  Skin: Skin is warm and dry. No rash noted. He is not diaphoretic. No erythema. No pallor.  Psychiatric: He has a normal mood and affect. His behavior is normal.  Nursing note and vitals reviewed.    ED Treatments / Results  Labs (all labs ordered are listed, but only abnormal results are displayed) Labs Reviewed - No data to display  EKG  EKG Interpretation None       Radiology No results found.  Procedures Procedures (including critical care time)  Medications Ordered in ED Medications  penicillin v potassium (VEETID) tablet 500 mg (500 mg Oral Given 05/23/16 0431)  naproxen (NAPROSYN) tablet 500 mg (500 mg Oral Given 05/23/16 0431)     Initial Impression / Assessment and Plan / ED Course  I have reviewed the triage vital signs and the nursing notes.  Pertinent labs & imaging results that were available during my care of the patient were reviewed by me and considered in my medical decision making (see chart for details).  Clinical Course    Patient with toothache. No gross abscess. Exam unconcerning for Ludwig's angina or spread of infection. Will treat with penicillin and pain medicine. Urged patient to follow-up with dentist. Return precautions and resource guide provided. Patient discharged in satisfactory condition with no unaddressed concerns.   Final Clinical Impressions(s) / ED Diagnoses   Final diagnoses:  Pain, dental    New Prescriptions Discharge Medication List as of 05/23/2016  4:16 AM    START taking these medications   Details  naproxen (NAPROSYN) 500 MG tablet Take 1 tablet (500 mg total) by mouth 2 (two) times daily., Starting Tue 05/23/2016, Print    penicillin  v potassium (VEETID) 500 MG tablet Take 1 tablet (500 mg total) by mouth 4 (four) times daily., Starting Tue 05/23/2016, Until Tue 05/30/2016, Print         Antony MaduraKelly Danni Shima, PA-C 05/23/16 [id number]    Tomasita CrumbleAdeleke Oni, MD 05/23/16 [phone]

## 2017-05-03 ENCOUNTER — Encounter (HOSPITAL_BASED_OUTPATIENT_CLINIC_OR_DEPARTMENT_OTHER): Payer: Self-pay | Admitting: *Deleted

## 2017-05-03 ENCOUNTER — Emergency Department (HOSPITAL_BASED_OUTPATIENT_CLINIC_OR_DEPARTMENT_OTHER)
Admission: EM | Admit: 2017-05-03 | Discharge: 2017-05-03 | Disposition: A | Discharge status: Home / Self Care | Payer: Medicaid Other | Attending: Emergency Medicine | Admitting: Emergency Medicine

## 2017-05-03 DIAGNOSIS — K1370 Unspecified lesions of oral mucosa: Secondary | ICD-10-CM | POA: Insufficient documentation

## 2017-05-03 DIAGNOSIS — F17210 Nicotine dependence, cigarettes, uncomplicated: Secondary | ICD-10-CM | POA: Diagnosis not present

## 2017-05-03 DIAGNOSIS — K121 Other forms of stomatitis: Secondary | ICD-10-CM

## 2017-05-03 DIAGNOSIS — Z79899 Other long term (current) drug therapy: Secondary | ICD-10-CM | POA: Diagnosis not present

## 2017-05-03 MED ORDER — PREDNISONE 50 MG PO TABS
60.0000 mg | ORAL_TABLET | Freq: Once | ORAL | Status: AC
  Administered 2017-05-03: 60 mg via ORAL
  Filled 2017-05-03: qty 1

## 2017-05-03 MED ORDER — MAGIC MOUTHWASH W/LIDOCAINE: 5.0000 mL | Freq: Three times a day (TID) | ORAL | 0 refills | Status: DC | PRN

## 2017-05-03 MED ORDER — PREDNISONE 10 MG (21) PO TBPK: ORAL_TABLET | ORAL | 0 refills | Status: DC

## 2017-05-03 NOTE — ED Triage Notes (Signed)
Mouth lesions x 10 days. States he has Behcets disease.

## 2017-05-03 NOTE — ED Provider Notes (Signed)
[facility]   CSN: [id number] Arrival date & time: 05/03/17  2000  By signing my name below, I, Thelma Bargeick Cochran, attest that this documentation has been prepared under the direction and in the presence of Shawn Joy, PA-C. Electronically Signed: Thelma BargeNick Cochran, Scribe. 05/03/17. 10:02 PM.  History   Chief Complaint Chief Complaint  Patient presents with  . Mouth Lesions   The history is provided by the patient. No language interpreter was used.    HPI Comments: Paul House is a 26 y.o. male with PMHx of Behcet's diseasewho presents to the Emergency Department complaining of constant mouth lesions for 2 weeks. This is normal to his usual Behcet's flare up. He states he will also get thrush when he gets mouth sores because he cannot properly care for the inside of his mouth. Patient requests prednisone taper. He is followed by rheumatology. Denies fever/chills, nausea/vomiting, difficulty swallowing or breathing, facial or throat swelling, or any other complaints.    Past Medical History:  Diagnosis Date  . Behcet's disease (HCC)   . Herpes   . MRSA (methicillin resistant staph aureus) culture positive   . Recurrent mouth ulceration    "since 26 years old"  . Stomatitis and mucositis     Patient Active Problem List   Diagnosis Date Noted  . Behcet's disease (HCC) 09/27/2013  . Stomatitis and mucositis 03/15/2013  . Aphthous ulcer of mouth 03/15/2013  . Hypokalemia 03/12/2013  . Protein-calorie malnutrition, severe (HCC) 03/12/2013  . Acute kidney injury (HCC) 07/08/2012  . Hyperglycemia 07/08/2012  . Cigarette smoker 07/08/2012    Past Surgical History:  Procedure Laterality Date  . BIOPSY PHARYNX         Home Medications    Prior to Admission medications   Medication Sig Start Date End Date Taking? Authorizing Provider  benzonatate (TESSALON) 100 MG capsule Take 1 capsule (100 mg total) by mouth every 8 (eight) hours. 02/27/16    Law, Waylan BogaAlexandra M, PA-C  docusate sodium (COLACE) 100 MG capsule Take 1 capsule (100 mg total) by mouth daily. 02/27/16   Law, Waylan BogaAlexandra M, PA-C  lidocaine (XYLOCAINE) 2 % solution Use as directed 15 mLs in the mouth or throat as needed for mouth pain. 04/23/16   Joy, Shawn C, PA-C  magic mouthwash SOLN Take 5 mLs by mouth 4 (four) times daily as needed for mouth pain. 02/27/16   Emi HolesLaw, Alexandra M, PA-C  magic mouthwash w/lidocaine SOLN Take 5 mLs by mouth 3 (three) times daily as needed for mouth pain. 05/03/17   Joy, Shawn C, PA-C  naproxen (NAPROSYN) 500 MG tablet Take 1 tablet (500 mg total) by mouth 2 (two) times daily. 05/23/16   Antony MaduraHumes, Kelly, PA-C  predniSONE (STERAPRED UNI-PAK 21 TAB) 10 MG (21) TBPK tablet Take 6 tabs by mouth daily  for 2 days, then 5 tabs for 2 days, then 4 tabs for 2 days, then 3 tabs for 2 days, 2 tabs for 2 days, then 1 tab by mouth daily for 2 days 05/03/17   Anselm PancoastJoy, Shawn C, PA-C    Family History Family History  Problem Relation Age of Onset  . Heart disease Mother   . Lupus Neg Hx   . Crohn's disease Neg Hx     Social History Social History  Substance Use Topics  . Smoking status: Current Every Day Smoker    Packs/day: 1.00    Types: Cigarettes  . Smokeless tobacco: Never Used  . Alcohol use No  Comment: occ     Allergies   Patient has no known allergies.   Review of Systems Review of Systems  Constitutional: Negative for fever.  HENT: Positive for mouth sores. Negative for facial swelling, sore throat and trouble swallowing.   Respiratory: Negative for shortness of breath.   Cardiovascular: Negative for chest pain.  Gastrointestinal: Negative for abdominal pain, nausea and vomiting.  Musculoskeletal: Negative for neck pain and neck stiffness.  Neurological: Negative for headaches.  All other systems reviewed and are negative.    Physical Exam Updated Vital Signs BP (!) 125/94   Pulse 98   Temp 99.3 F (37.4 C) (Oral)   Resp 20   Ht 6'  (1.829 m)   Wt 63.5 kg (140 lb)   SpO2 100%   BMI 18.99 kg/m   Physical Exam  Constitutional: He appears well-developed and well-nourished. No distress.  HENT:  Head: Normocephalic and atraumatic.  Mouth/Throat: Oral lesions present.  Widespread, tender ulcerations throughout the inside of the mouth and a thick white coating on the tongue. Foul odor from the mouth. Patient handles oral secretions without difficulty.  Eyes: Conjunctivae are normal.  Neck: Normal range of motion. Neck supple.  No tenderness or swelling to the soft tissues of the neck. Patient has no apparent difficulty with swallowing.  Cardiovascular: Normal rate, regular rhythm, normal heart sounds and intact distal pulses.   Pulmonary/Chest: Effort normal and breath sounds normal. No respiratory distress.  Abdominal: Soft. There is no tenderness. There is no guarding.  Musculoskeletal: He exhibits no edema.  Lymphadenopathy:    He has no cervical adenopathy.  Neurological: He is alert.  Skin: Skin is warm and dry. He is not diaphoretic.  Psychiatric: He has a normal mood and affect. His behavior is normal.  Nursing note and vitals reviewed.    ED Treatments / Results  DIAGNOSTIC STUDIES: Oxygen Saturation is 100% on RA, normal by my interpretation.    COORDINATION OF CARE: 10:02 PM Discussed treatment plan with pt at bedside and pt agreed to plan.  Labs (all labs ordered are listed, but only abnormal results are displayed) Labs Reviewed - No data to display  EKG  EKG Interpretation None       Radiology No results found.  Procedures Procedures (including critical care time)  Medications Ordered in ED Medications  predniSONE (DELTASONE) tablet 60 mg (60 mg Oral Given 05/03/17 2208)     Initial Impression / Assessment and Plan / ED Course  I have reviewed the triage vital signs and the nursing notes.  Pertinent labs & imaging results that were available during my care of the patient were  reviewed by me and considered in my medical decision making (see chart for details).     Patient presents with mouth sores that he states is consistent with his Behcets disease. Both these issues were addressed. Patient was advised that if he does have thrush, the prednisone can make this worse. Patient states that all his symptoms typically clear up once his sores are treated with the prednisone. He will follow-up with his rheumatologist. Return precautions discussed.  Final Clinical Impressions(s) / ED Diagnoses   Final diagnoses:  Mouth ulcers    New Prescriptions Discharge Medication List as of 05/03/2017 10:05 PM    START taking these medications   Details  magic mouthwash w/lidocaine SOLN Take 5 mLs by mouth 3 (three) times daily as needed for mouth pain., Starting Thu 05/03/2017, Print      I personally performed  the services described in this documentation, which was scribed in my presence. The recorded information has been reviewed and is accurate.   Anselm PancoastJoy, Shawn C, PA-C 05/04/17 1643    Tilden Fossaees, Elizabeth, MD 05/06/17 [phone]

## 2017-05-03 NOTE — Discharge Instructions (Signed)
Please take the prednisone, as prescribed. Use the Magic mouthwash as needed for mouth pain. Follow up with your rheumatologist.

## 2017-06-22 ENCOUNTER — Emergency Department (HOSPITAL_BASED_OUTPATIENT_CLINIC_OR_DEPARTMENT_OTHER)
Admission: EM | Admit: 2017-06-22 | Discharge: 2017-06-22 | Disposition: A | Discharge status: Home / Self Care | Payer: Medicaid Other | Attending: Emergency Medicine | Admitting: Emergency Medicine

## 2017-06-22 ENCOUNTER — Encounter (HOSPITAL_BASED_OUTPATIENT_CLINIC_OR_DEPARTMENT_OTHER): Payer: Self-pay | Admitting: Emergency Medicine

## 2017-06-22 DIAGNOSIS — K1379 Other lesions of oral mucosa: Secondary | ICD-10-CM | POA: Diagnosis present

## 2017-06-22 DIAGNOSIS — F17210 Nicotine dependence, cigarettes, uncomplicated: Secondary | ICD-10-CM | POA: Diagnosis not present

## 2017-06-22 DIAGNOSIS — Z79899 Other long term (current) drug therapy: Secondary | ICD-10-CM | POA: Insufficient documentation

## 2017-06-22 DIAGNOSIS — M352 Behcet's disease: Secondary | ICD-10-CM | POA: Diagnosis not present

## 2017-06-22 MED ORDER — CLOBETASOL PROPIONATE 0.05 % EX OINT: 1.0000 "application " | TOPICAL_OINTMENT | Freq: Two times a day (BID) | CUTANEOUS | 0 refills | Status: DC

## 2017-06-22 MED ORDER — SUCRALFATE 1 GM/10ML PO SUSP: 1.0000 g | Freq: Three times a day (TID) | ORAL | 0 refills | Status: DC

## 2017-06-22 MED FILL — CARAFATE 1 GM/10 ML SUSP: 1 | 11 days supply | Qty: 420 | Fill #0

## 2017-06-22 MED FILL — CLOBETASOL 0.05% OINTMENT: 0.05 | 30 days supply | Qty: 60 | Fill #0

## 2017-06-22 NOTE — Discharge Instructions (Signed)
Your sores are likely due to Behcet's disease.  Apply clobetasol ointment to affected area twice daily until your rash resolved.  Rinse mouth with sucralfate suspension 4 times daily to help ease mouth discomfort.  Follow up with your hematologist specialist for further care.

## 2017-06-22 NOTE — ED Triage Notes (Addendum)
Mouth lesions for 4 days.  Arms lesions x 8 days.  Pt took one diflucan pill which pt thought he was getting worse.

## 2017-06-22 NOTE — ED Notes (Signed)
ED Provider at bedside. 

## 2017-06-22 NOTE — ED Provider Notes (Signed)
[facility]   CSN: [id number] Arrival date & time: 06/22/17  0920     History   Chief Complaint No chief complaint on file.   HPI Jadavion Spoelstra is a 26 y.o. male.  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  For the past week patient has noticed increasing painful sores around his penis, as well as around his lips and R forearm.  The source of similar to prior Behcet's related sores.  No associated fever, headache, throat swelling, cp, sob, abd pain, vision changes, or difficulty urinating.  He was seen by his hematologist shortly before this rash for a follow up visit.  He was given fluconazole for oral thrush.  Pt sts his sxs flare up after he took this medication.  Report currently taking oral steroid which did help mildly.  Denies hx of STI, or recent sick contact.    Past Medical History:  Diagnosis Date  . Behcet's disease (HCC)   . Herpes   . MRSA (methicillin resistant staph aureus) culture positive   . Recurrent mouth ulceration    "since 26 years old"  . Stomatitis and mucositis     Patient Active Problem List   Diagnosis Date Noted  . Behcet's disease (HCC) 09/27/2013  . Stomatitis and mucositis 03/15/2013  . Aphthous ulcer of mouth 03/15/2013  . Hypokalemia 03/12/2013  . Protein-calorie malnutrition, severe (HCC) 03/12/2013  . Acute kidney injury (HCC) 07/08/2012  . Hyperglycemia 07/08/2012  . Cigarette smoker 07/08/2012    Past Surgical History:  Procedure Laterality Date  . BIOPSY PHARYNX         Home Medications    Prior to Admission medications   Medication Sig Start Date End Date Taking? Authorizing Provider  benzonatate (TESSALON) 100 MG capsule Take 1 capsule (100 mg total) by mouth every 8 (eight) hours. 02/27/16   Law, Waylan Boga, PA-C  docusate sodium (COLACE) 100 MG capsule Take 1 capsule (100 mg total) by mouth daily. 02/27/16   Law, Waylan Boga, PA-C    lidocaine (XYLOCAINE) 2 % solution Use as directed 15 mLs in the mouth or throat as needed for mouth pain. 04/23/16   Joy, Shawn C, PA-C  magic mouthwash SOLN Take 5 mLs by mouth 4 (four) times daily as needed for mouth pain. 02/27/16   Emi Holes, PA-C  magic mouthwash w/lidocaine SOLN Take 5 mLs by mouth 3 (three) times daily as needed for mouth pain. 05/03/17   Joy, Shawn C, PA-C  naproxen (NAPROSYN) 500 MG tablet Take 1 tablet (500 mg total) by mouth 2 (two) times daily. 05/23/16   Antony Madura, PA-C  predniSONE (STERAPRED UNI-PAK 21 TAB) 10 MG (21) TBPK tablet Take 6 tabs by mouth daily  for 2 days, then 5 tabs for 2 days, then 4 tabs for 2 days, then 3 tabs for 2 days, 2 tabs for 2 days, then 1 tab by mouth daily for 2 days 05/03/17   Anselm Pancoast, PA-C    Family History Family History  Problem Relation Age of Onset  . Heart disease Mother   . Lupus Neg Hx   . Crohn's disease Neg Hx     Social History Social History  Substance Use Topics  . Smoking status: Current Every Day Smoker    Packs/day: 1.00    Types: Cigarettes  . Smokeless tobacco: Never Used  . Alcohol use No     Comment: occ  Allergies   Patient has no known allergies.   Review of Systems Review of Systems  Constitutional: Negative for fever.  HENT: Positive for mouth sores.   Musculoskeletal: Negative for arthralgias.  Skin: Positive for rash.  Neurological: Negative for headaches.  All other systems reviewed and are negative.    Physical Exam Updated Vital Signs There were no vitals taken for this visit.  Physical Exam  Constitutional: He is oriented to person, place, and time. He appears well-developed and well-nourished. No distress.  HENT:  Head: Atraumatic.  Mouth: Bilateral lips with mucopurulent rash with moderate edema, and tenderness. Tongue with thick white coating.  Throat normal  Eyes: Conjunctivae are normal.  Neck: Normal range of motion. Neck supple.  No nuchal rigidity   Cardiovascular: Normal rate and regular rhythm.   Pulmonary/Chest: Effort normal and breath sounds normal.  Abdominal: Soft. Bowel sounds are normal. He exhibits no distension. There is no tenderness.  Genitourinary:  Genitourinary Comments: Chaperone present during exam.  Mucopurulent skin rash to shaft and penile gland.  Neurological: He is alert and oriented to person, place, and time.  Skin: No rash (R forearm: multiple ulcerated lesions with scab formation noted to R forearm) noted.  Psychiatric: He has a normal mood and affect.  Nursing note and vitals reviewed.    ED Treatments / Results  Labs (all labs ordered are listed, but only abnormal results are displayed) Labs Reviewed - No data to display  EKG  EKG Interpretation None       Radiology No results found.  Procedures Procedures (including critical care time)  Medications Ordered in ED Medications - No data to display   Initial Impression / Assessment and Plan / ED Course  I have reviewed the triage vital signs and the nursing notes.  Pertinent labs & imaging results that were available during my care of the patient were reviewed by me and considered in my medical decision making (see chart for details).     BP 126/78   Pulse 76   Temp 97.6 F (36.4 C) (Axillary)   Resp 16   Ht 6' (1.829 m)   Wt 67.6 kg (149 lb)   SpO2 99%   BMI 20.21 kg/m    Final Clinical Impressions(s) / ED Diagnoses   Final diagnoses:  Behcet recurrent disease (HCC)    New Prescriptions New Prescriptions   CLOBETASOL OINTMENT (TEMOVATE) 0.05 %    Apply 1 application topically 2 (two) times daily.   SUCRALFATE (CARAFATE) 1 GM/10ML SUSPENSION    Take 10 mLs (1 g total) by mouth 4 (four) times daily -  with meals and at bedtime.   9:54 AM Patient with mucopurulent rash involving his lips, penis, as well as right forearm consistence with history of Behcet's vasculitis. He is currently taking oral prednisone.  Triamcinolone  acetonice cream TID/QID as well as sucralfate suspension for oral rinse.  Pt recommend to f/u closely with hematologist for further care. Care discussed with DR. Dan Maker, PA-C 06/22/17 1005    Cathren Laine, MD 06/22/17 [phone]

## 2017-12-07 ENCOUNTER — Other Ambulatory Visit: Payer: Self-pay

## 2017-12-07 ENCOUNTER — Encounter (HOSPITAL_BASED_OUTPATIENT_CLINIC_OR_DEPARTMENT_OTHER): Payer: Self-pay | Admitting: Emergency Medicine

## 2017-12-07 ENCOUNTER — Emergency Department (HOSPITAL_BASED_OUTPATIENT_CLINIC_OR_DEPARTMENT_OTHER)
Admission: EM | Admit: 2017-12-07 | Discharge: 2017-12-07 | Disposition: A | Discharge status: Home / Self Care | Payer: Medicaid Other | Attending: Emergency Medicine | Admitting: Emergency Medicine

## 2017-12-07 DIAGNOSIS — M352 Behcet's disease: Secondary | ICD-10-CM | POA: Insufficient documentation

## 2017-12-07 DIAGNOSIS — Z79899 Other long term (current) drug therapy: Secondary | ICD-10-CM | POA: Insufficient documentation

## 2017-12-07 DIAGNOSIS — K625 Hemorrhage of anus and rectum: Secondary | ICD-10-CM | POA: Insufficient documentation

## 2017-12-07 DIAGNOSIS — F17210 Nicotine dependence, cigarettes, uncomplicated: Secondary | ICD-10-CM | POA: Insufficient documentation

## 2017-12-07 LAB — COMPREHENSIVE METABOLIC PANEL
ALK PHOS: 65 U/L (ref 38–126)
ALT: 17 U/L (ref 17–63)
ANION GAP: 8 (ref 5–15)
AST: 24 U/L (ref 15–41)
Albumin: 4.2 g/dL (ref 3.5–5.0)
BILIRUBIN TOTAL: 0.9 mg/dL (ref 0.3–1.2)
BUN: 14 mg/dL (ref 6–20)
CO2: 26 mmol/L (ref 22–32)
CREATININE: 0.97 mg/dL (ref 0.61–1.24)
Calcium: 9 mg/dL (ref 8.9–10.3)
Chloride: 104 mmol/L (ref 101–111)
GFR calc non Af Amer: >60 mL/min (ref >60)
GLUCOSE: 86 mg/dL (ref 65–99)
Potassium: 4.1 mmol/L (ref 3.5–5.1)
SODIUM: 138 mmol/L (ref 135–145)
TOTAL PROTEIN: 8.2 g/dL — AB (ref 6.5–8.1)

## 2017-12-07 LAB — CBC WITH DIFFERENTIAL/PLATELET
Basophils Absolute: 0 10*3/uL (ref 0.0–0.1)
Basophils Relative: 0 %
Eosinophils Absolute: 0.1 10*3/uL (ref 0.0–0.7)
Eosinophils Relative: 3 %
HEMATOCRIT: 42.2 % (ref 39.0–52.0)
Hemoglobin: 14.5 g/dL (ref 13.0–17.0)
LYMPHS ABS: 2 10*3/uL (ref 0.7–4.0)
LYMPHS PCT: 47 %
MCH: 32 pg (ref 26.0–34.0)
MCHC: 34.4 g/dL (ref 30.0–36.0)
MCV: 93.2 fL (ref 78.0–100.0)
MONOS PCT: 17 %
Monocytes Absolute: 0.7 10*3/uL (ref 0.1–1.0)
NEUTROS ABS: 1.4 10*3/uL — AB (ref 1.7–7.7)
NEUTROS PCT: 33 %
Platelets: 194 10*3/uL (ref 150–400)
RBC: 4.53 MIL/uL (ref 4.22–5.81)
RDW: 11.7 % (ref 11.5–15.5)
WBC: 4.3 10*3/uL (ref 4.0–10.5)

## 2017-12-07 MED ORDER — PREDNISONE 20 MG PO TABS: 60.0000 mg | ORAL_TABLET | Freq: Every day | ORAL | 0 refills | Status: DC

## 2017-12-07 MED ORDER — PREDNISONE 50 MG PO TABS
60.0000 mg | ORAL_TABLET | Freq: Once | ORAL | Status: AC
  Administered 2017-12-07: 60 mg via ORAL
  Filled 2017-12-07: qty 1

## 2017-12-07 MED ORDER — SODIUM CHLORIDE 0.9 % IV BOLUS (SEPSIS)
1000.0000 mL | Freq: Once | INTRAVENOUS | Status: AC
  Administered 2017-12-07: 1000 mL via INTRAVENOUS

## 2017-12-07 MED ORDER — LIDOCAINE VISCOUS 2 % MT SOLN
20.0000 mL | Freq: Once | OROMUCOSAL | Status: AC
  Administered 2017-12-07: 15 mL via OROMUCOSAL
  Filled 2017-12-07: qty 30

## 2017-12-07 MED ORDER — LIDOCAINE VISCOUS 2 % MT SOLN: 15.0000 mL | OROMUCOSAL | 0 refills | Status: DC | PRN

## 2017-12-07 NOTE — ED Provider Notes (Signed)
[facility] EMERGENCY DEPARTMENT Provider Note   CSN: [id number] Arrival date & time: 12/07/17  1059     History   Chief Complaint Chief Complaint  Patient presents with  . Mouth Lesions  . Rectal Bleeding    HPI   Blood pressure 126/75, pulse 83, temperature 98.4 F (36.9 C), temperature source Oral, resp. rate 16, height 6' (1.829 m), weight 77 kg (169 lb 12.1 oz), SpO2 98 %.  Paul House is a 27 y.o. male complaining of Behcets flare with mouth ulcerations and rectal pain and slight rectal bleeding over the course of 2 weeks.  Patient states that he is uninsured, he is on any medication for his Behcets   Past Medical History:  Diagnosis Date  . Behcet's disease (HCC)   . Herpes   . MRSA (methicillin resistant staph aureus) culture positive   . Recurrent mouth ulceration    "since 27 years old"  . Stomatitis and mucositis     Patient Active Problem List   Diagnosis Date Noted  . Behcet's disease (HCC) 09/27/2013  . Stomatitis and mucositis 03/15/2013  . Aphthous ulcer of mouth 03/15/2013  . Hypokalemia 03/12/2013  . Protein-calorie malnutrition, severe (HCC) 03/12/2013  . Acute kidney injury (HCC) 07/08/2012  . Hyperglycemia 07/08/2012  . Cigarette smoker 07/08/2012    Past Surgical History:  Procedure Laterality Date  . BIOPSY PHARYNX         Home Medications    Prior to Admission medications   Medication Sig Start Date End Date Taking? Authorizing Provider  benzonatate (TESSALON) 100 MG capsule Take 1 capsule (100 mg total) by mouth every 8 (eight) hours. 02/27/16   Law, Waylan Boga, PA-C  clobetasol ointment (TEMOVATE) 0.05 % Apply 1 application topically 2 (two) times daily. 06/22/17   Fayrene Helper, PA-C  docusate sodium (COLACE) 100 MG capsule Take 1 capsule (100 mg total) by mouth daily. 02/27/16   Law, Waylan Boga, PA-C  lidocaine (XYLOCAINE) 2 % solution Use as directed 15 mLs in the mouth or throat as needed for mouth pain.  04/23/16   Joy, Shawn C, PA-C  lidocaine (XYLOCAINE) 2 % solution Use as directed 15 mLs in the mouth or throat every 4 (four) hours as needed. 12/07/17   Brenton Joines, Joni Reining, PA-C  magic mouthwash SOLN Take 5 mLs by mouth 4 (four) times daily as needed for mouth pain. 02/27/16   Emi Holes, PA-C  magic mouthwash w/lidocaine SOLN Take 5 mLs by mouth 3 (three) times daily as needed for mouth pain. 05/03/17   Joy, Shawn C, PA-C  naproxen (NAPROSYN) 500 MG tablet Take 1 tablet (500 mg total) by mouth 2 (two) times daily. 05/23/16   Antony Madura, PA-C  predniSONE (DELTASONE) 20 MG tablet Take 3 tablets (60 mg total) by mouth daily. Take 60 mg by mouth daily week 1, then 40mg  by mouth daily for week 2, then 20mg  daily for week 3 12/07/17   Jaisen Wiltrout, Joni Reining, PA-C  sucralfate (CARAFATE) 1 GM/10ML suspension Take 10 mLs (1 g total) by mouth 4 (four) times daily -  with meals and at bedtime. 06/22/17   Fayrene Helper, PA-C    Family History Family History  Problem Relation Age of Onset  . Heart disease Mother   . Lupus Neg Hx   . Crohn's disease Neg Hx     Social History Social History   Tobacco Use  . Smoking status: Current Every Day Smoker    Packs/day: 1.00    Types:  Cigarettes  . Smokeless tobacco: Never Used  Substance Use Topics  . Alcohol use: No    Comment: occ  . Drug use: No     Allergies   Fluconazole   Review of Systems Review of Systems  A complete review of systems was obtained and all systems are negative except as noted in the HPI and PMH.   Physical Exam Updated Vital Signs BP 126/75   Pulse 83   Temp 98.4 F (36.9 C) (Oral)   Resp 16   Ht 6' (1.829 m)   Wt 77 kg (169 lb 12.1 oz)   SpO2 98%   BMI 23.02 kg/m   Physical Exam  Constitutional: He is oriented to person, place, and time. He appears well-developed and well-nourished. No distress.  HENT:  Head: Normocephalic and atraumatic.  Mouth/Throat: Oropharynx is clear and moist.  Scattered intraoral  ulcerations approximately 1 cm, no significant bleeding  Eyes: Conjunctivae and EOM are normal. Pupils are equal, round, and reactive to light.  Neck: Normal range of motion.  Cardiovascular: Normal rate, regular rhythm and intact distal pulses.  Pulmonary/Chest: Effort normal and breath sounds normal.  Abdominal: Soft. There is no tenderness.  Musculoskeletal: Normal range of motion.  Neurological: He is alert and oriented to person, place, and time.  Skin: He is not diaphoretic.  Psychiatric: He has a normal mood and affect.  Nursing note and vitals reviewed.    ED Treatments / Results  Labs (all labs ordered are listed, but only abnormal results are displayed) Labs Reviewed  CBC WITH DIFFERENTIAL/PLATELET - Abnormal; Notable for the following components:      Result Value   Neutro Abs 1.4 (*)    All other components within normal limits  COMPREHENSIVE METABOLIC PANEL - Abnormal; Notable for the following components:   Total Protein 8.2 (*)    All other components within normal limits    EKG  EKG Interpretation None       Radiology No results found.  Procedures Procedures (including critical care time)  Medications Ordered in ED Medications  sodium chloride 0.9 % bolus 1,000 mL (0 mLs Intravenous Stopped 12/07/17 1404)  predniSONE (DELTASONE) tablet 60 mg (60 mg Oral Given 12/07/17 1410)  lidocaine (XYLOCAINE) 2 % viscous mouth solution 20 mL (15 mLs Mouth/Throat Given 12/07/17 1411)     Initial Impression / Assessment and Plan / ED Course  I have reviewed the triage vital signs and the nursing notes.  Pertinent labs & imaging results that were available during my care of the patient were reviewed by me and considered in my medical decision making (see chart for details).     Vitals:   12/07/17 1122 12/07/17 1123 12/07/17 1124  BP:  126/75   Pulse:  83   Resp:  16   Temp:  98.4 F (36.9 C)   TempSrc:  Oral   SpO2:  98%   Weight:   77 kg (169 lb 12.1 oz)    Height: 6' (1.829 m)      Medications  sodium chloride 0.9 % bolus 1,000 mL (0 mLs Intravenous Stopped 12/07/17 1404)  predniSONE (DELTASONE) tablet 60 mg (60 mg Oral Given 12/07/17 1410)  lidocaine (XYLOCAINE) 2 % viscous mouth solution 20 mL (15 mLs Mouth/Throat Given 12/07/17 1411)    Paul House is 27 y.o. male presenting with Behcets flare.  Scattered intraoral lesions.  Patient will be started on day prednisone taper, lidocaine 4 pain control.  Evaluation does not show pathology that  would require ongoing emergent intervention or inpatient treatment. Pt is hemodynamically stable and mentating appropriately. Discussed findings and plan with patient/guardian, who agrees with care plan. All questions answered. Return precautions discussed and outpatient follow up given.      Final Clinical Impressions(s) / ED Diagnoses   Final diagnoses:  Behcet's disease [facility](HCC)    ED Discharge Orders        Ordered    predniSONE (DELTASONE) 20 MG tablet  Daily     12/07/17 1403    lidocaine (XYLOCAINE) 2 % solution  Every 4 hours PRN     12/07/17 1403       Hiedi Touchton, Mardella Laymanicole, PA-C 12/07/17 1542    Charlynne PanderYao, David Hsienta, MD 12/09/17 [phone]

## 2017-12-07 NOTE — Discharge Instructions (Signed)
Please follow with your primary care doctor in the next 2 days for a check-up. They must obtain records for further management.  ° °Do not hesitate to return to the Emergency Department for any new, worsening or concerning symptoms.  ° °

## 2017-12-07 NOTE — ED Triage Notes (Addendum)
Pt c/o mouth ulcers (Behcet's) flare up; also reports rectal pain and bleeding x 2 wks

## 2018-01-30 ENCOUNTER — Emergency Department (HOSPITAL_BASED_OUTPATIENT_CLINIC_OR_DEPARTMENT_OTHER)
Admission: EM | Admit: 2018-01-30 | Discharge: 2018-01-30 | Disposition: A | Discharge status: Home / Self Care | Payer: Medicaid Other | Attending: Physician Assistant | Admitting: Physician Assistant

## 2018-01-30 ENCOUNTER — Other Ambulatory Visit: Payer: Self-pay

## 2018-01-30 ENCOUNTER — Encounter (HOSPITAL_BASED_OUTPATIENT_CLINIC_OR_DEPARTMENT_OTHER): Payer: Self-pay

## 2018-01-30 DIAGNOSIS — Z79899 Other long term (current) drug therapy: Secondary | ICD-10-CM | POA: Insufficient documentation

## 2018-01-30 DIAGNOSIS — M352 Behcet's disease: Secondary | ICD-10-CM | POA: Insufficient documentation

## 2018-01-30 DIAGNOSIS — F17210 Nicotine dependence, cigarettes, uncomplicated: Secondary | ICD-10-CM | POA: Insufficient documentation

## 2018-01-30 MED ORDER — MAGIC MOUTHWASH W/LIDOCAINE: 5.0000 mL | Freq: Three times a day (TID) | ORAL | 0 refills | Status: DC | PRN

## 2018-01-30 MED ORDER — PREDNISONE 50 MG PO TABS
60.0000 mg | ORAL_TABLET | Freq: Once | ORAL | Status: AC
  Administered 2018-01-30: 60 mg via ORAL
  Filled 2018-01-30: qty 1

## 2018-01-30 MED ORDER — PREDNISONE 20 MG PO TABS: 60.0000 mg | ORAL_TABLET | Freq: Every day | ORAL | 0 refills | Status: DC

## 2018-01-30 MED FILL — predniSONE 20 MG TABS: 20 | 21 days supply | Qty: 42 | Fill #0

## 2018-01-30 MED FILL — MAGIC MOUTHWASH W/LIDO 1:1: 7 days supply | Qty: 100 | Fill #0

## 2018-01-30 NOTE — ED Provider Notes (Signed)
[facility] EMERGENCY DEPARTMENT Provider Note   CSN: [id number] Arrival date & time: 01/30/18  1015     History   Chief Complaint Chief Complaint  Patient presents with  . Mouth Lesions    HPI Paul House is a 27 y.o. male with past medical history of Behcet's disease, presenting to the ED with mouth sores that began about 1 week ago.  Patient states this is a typical flare of his Behcet's disease.  States he took the leftovers of a previously prescribed prednisone taper pack last week, however it did not provide improvement.  States his mouth is painful and it hurts to speak.  Denies difficulty swallowing or breathing, fever, genital sore, or other complaints.  Has not seen his specialist since September due to Medicaid issues. Per chart review, [facility], and was well controlled with Chauncey Cruelhumira however his insurance denied the request for continued use in September.  The history is provided by the patient.    Past Medical History:  Diagnosis Date  . Behcet's disease (HCC)   . Herpes   . MRSA (methicillin resistant staph aureus) culture positive   . Recurrent mouth ulceration    "since 27 years old"  . Stomatitis and mucositis     Patient Active Problem List   Diagnosis Date Noted  . Behcet's disease (HCC) 09/27/2013  . Stomatitis and mucositis 03/15/2013  . Aphthous ulcer of mouth 03/15/2013  . Hypokalemia 03/12/2013  . Protein-calorie malnutrition, severe (HCC) 03/12/2013  . Acute kidney injury (HCC) 07/08/2012  . Hyperglycemia 07/08/2012  . Cigarette smoker 07/08/2012    Past Surgical History:  Procedure Laterality Date  . BIOPSY PHARYNX          Home Medications    Prior to Admission medications   Medication Sig Start Date End Date Taking? Authorizing Provider  benzonatate (TESSALON) 100 MG capsule Take 1 capsule (100 mg total) by mouth every 8 (eight) hours. 02/27/16   Law, Waylan BogaAlexandra M, PA-C  clobetasol  ointment (TEMOVATE) 0.05 % Apply 1 application topically 2 (two) times daily. 06/22/17   Fayrene Helperran, Bowie, PA-C  docusate sodium (COLACE) 100 MG capsule Take 1 capsule (100 mg total) by mouth daily. 02/27/16   Law, Waylan BogaAlexandra M, PA-C  lidocaine (XYLOCAINE) 2 % solution Use as directed 15 mLs in the mouth or throat as needed for mouth pain. 04/23/16   Joy, Shawn C, PA-C  lidocaine (XYLOCAINE) 2 % solution Use as directed 15 mLs in the mouth or throat every 4 (four) hours as needed. 12/07/17   Pisciotta, Joni ReiningNicole, PA-C  magic mouthwash SOLN Take 5 mLs by mouth 4 (four) times daily as needed for mouth pain. 02/27/16   Emi HolesLaw, Alexandra M, PA-C  magic mouthwash w/lidocaine SOLN Take 5 mLs by mouth 3 (three) times daily as needed for mouth pain. 01/30/18   Robinson, [place] N, PA-C  naproxen (NAPROSYN) 500 MG tablet Take 1 tablet (500 mg total) by mouth 2 (two) times daily. 05/23/16   Antony MaduraHumes, Kelly, PA-C  predniSONE (DELTASONE) 20 MG tablet Take 3 tablets (60 mg total) by mouth daily. Take 60 mg by mouth daily week 1, then 40mg  by mouth daily for week 2, then 20mg  daily for week 3 01/30/18   Robinson, [place] N, PA-C  sucralfate (CARAFATE) 1 GM/10ML suspension Take 10 mLs (1 g total) by mouth 4 (four) times daily -  with meals and at bedtime. 06/22/17   Fayrene Helperran, Bowie, PA-C    Family History Family  History  Problem Relation Age of Onset  . Heart disease Mother   . Lupus Neg Hx   . Crohn's disease Neg Hx     Social History Social History   Tobacco Use  . Smoking status: Current Every Day Smoker    Packs/day: 1.00    Types: Cigarettes  . Smokeless tobacco: Never Used  Substance Use Topics  . Alcohol use: No    Comment: occ  . Drug use: No     Allergies   Fluconazole   Review of Systems Review of Systems  Constitutional: Negative for fever.  HENT: Positive for mouth sores. Negative for trouble swallowing.   Respiratory: Negative for shortness of breath and stridor.   Genitourinary: Negative for genital  sores.  All other systems reviewed and are negative.    Physical Exam Updated Vital Signs BP (!) 132/91 (BP Location: Right Arm)   Pulse 77   Temp 97.9 F (36.6 C) (Oral)   Resp 17   Ht 6' (1.829 m)   Wt 72.6 kg (160 lb)   SpO2 98%   BMI 21.70 kg/m   Physical Exam  Constitutional: He appears well-developed and well-nourished. No distress.  Tolerating secretions  HENT:  Head: Normocephalic and atraumatic.  Malodorous yellow/white coating of all surfaces of oral mucosa with some ulcerations. Gingiva appears inflamed throughout  Eyes: Conjunctivae are normal.  Neck: Normal range of motion. Neck supple.  Cardiovascular: Normal rate.  Pulmonary/Chest: Effort normal.  Lymphadenopathy:    He has no cervical adenopathy.  Psychiatric: He has a normal mood and affect. His behavior is normal.  Nursing note and vitals reviewed.    ED Treatments / Results  Labs (all labs ordered are listed, but only abnormal results are displayed) Labs Reviewed - No data to display  EKG None  Radiology No results found.   Procedures Procedures (including critical care time)  Medications Ordered in ED Medications  predniSONE (DELTASONE) tablet 60 mg (60 mg Oral Given 01/30/18 1041)     Initial Impression / Assessment and Plan / ED Course  I have reviewed the triage vital signs and the nursing notes.  Pertinent labs & imaging results that were available during my care of the patient were reviewed by me and considered in my medical decision making (see chart for details).     Pt presenting with mouth ulcerations consistent with Behcet's. Pt was well-controlled on humira, though is having trouble with insurance covering medications and therefore has not been taking since September. Reports improvement with steroid tapers. Pt discussed with and evaluated by Dr. Corlis Leak. Will prescribe long prednisone taper and magic mouthwash. Discussed importance of follow up with his rheumatologist. Safe  for discharge.  Discussed results, findings, treatment and follow up. Patient advised of return precautions. Patient verbalized understanding and agreed with plan.  Final Clinical Impressions(s) / ED Diagnoses   Final diagnoses:  Behcet's disease [facility])    ED Discharge Orders        Ordered    predniSONE (DELTASONE) 20 MG tablet  Daily     01/30/18 1041    magic mouthwash w/lidocaine SOLN  3 times daily PRN     01/30/18 1041       Robinson, [place] N, [place] 01/30/18 1106    Mackuen, Cindee Salt, MD 01/30/18 1531

## 2018-01-30 NOTE — ED Triage Notes (Signed)
Pt reports one week history of oral lesions - denies sore throat, shortness of breath of pain. Hx of same. Foul odor noted.

## 2018-01-30 NOTE — Discharge Instructions (Addendum)
Maintain good oral hygiene.  Use the mouth wash 4 times per day for both pain and healing of your mouth sores. Take the prednisone as prescribed until it is gone. Follow up with your specialist regarding your recurrent flares.

## 2018-01-30 NOTE — ED Notes (Signed)
ED Provider at bedside. 

## 2018-04-05 ENCOUNTER — Other Ambulatory Visit: Payer: Self-pay

## 2018-04-05 ENCOUNTER — Emergency Department (HOSPITAL_BASED_OUTPATIENT_CLINIC_OR_DEPARTMENT_OTHER)
Admission: EM | Admit: 2018-04-05 | Discharge: 2018-04-05 | Disposition: A | Discharge status: Home / Self Care | Payer: Self-pay | Attending: Emergency Medicine | Admitting: Emergency Medicine

## 2018-04-05 ENCOUNTER — Encounter (HOSPITAL_BASED_OUTPATIENT_CLINIC_OR_DEPARTMENT_OTHER): Payer: Self-pay | Admitting: *Deleted

## 2018-04-05 DIAGNOSIS — Z79899 Other long term (current) drug therapy: Secondary | ICD-10-CM | POA: Insufficient documentation

## 2018-04-05 DIAGNOSIS — F17210 Nicotine dependence, cigarettes, uncomplicated: Secondary | ICD-10-CM | POA: Insufficient documentation

## 2018-04-05 DIAGNOSIS — K121 Other forms of stomatitis: Secondary | ICD-10-CM | POA: Insufficient documentation

## 2018-04-05 MED ORDER — PREDNISONE 20 MG PO TABS: 60.0000 mg | ORAL_TABLET | Freq: Every day | ORAL | 0 refills | Status: DC

## 2018-04-05 MED ORDER — MAGIC MOUTHWASH W/LIDOCAINE: 5.0000 mL | Freq: Three times a day (TID) | ORAL | 0 refills | Status: DC | PRN

## 2018-04-05 NOTE — ED Provider Notes (Signed)
[facility] EMERGENCY DEPARTMENT Provider Note   CSN: [id number] Arrival date & time: 04/05/18  1122     History   Chief Complaint Chief Complaint  Patient presents with  . Mouth Pain    HPI Paul House is a 27 y.o. male.  HPI   27 year old male with no history of Bercet's disease presenting for evaluation of mouth pain.  Patient report he does have a hematologist but unable to afford his Humira due to lack of income.  He normally would come to the ER to request for management of his persistent disease.  He has been seen in the ED multiple times for this. Last seen for the same complaint on 03/22/2018.  Patient was discharged home with a 5-day course of prednisone as well as Magic mouthwash.  He mentioned the medication did help temporarily but the course was not long enough and since then he still endorse pain and discomfort in his mouth.  Discomfort is moderate in severity, no associated fever chills neck pain pain chest or trouble breathing.  Endorsed occasional sharp left-sided headache lasting for a few minutes and resolved.  No active headache at this time.  No vision changes.  Past Medical History:  Diagnosis Date  . Behcet's disease (HCC)   . Herpes   . MRSA (methicillin resistant staph aureus) culture positive   . Recurrent mouth ulceration    "since 27 years old"  . Stomatitis and mucositis     Patient Active Problem List   Diagnosis Date Noted  . Behcet's disease (HCC) 09/27/2013  . Stomatitis and mucositis 03/15/2013  . Aphthous ulcer of mouth 03/15/2013  . Hypokalemia 03/12/2013  . Protein-calorie malnutrition, severe (HCC) 03/12/2013  . Acute kidney injury (HCC) 07/08/2012  . Hyperglycemia 07/08/2012  . Cigarette smoker 07/08/2012    Past Surgical History:  Procedure Laterality Date  . BIOPSY PHARYNX          Home Medications    Prior to Admission medications   Medication Sig Start Date End Date Taking? Authorizing Provider    benzonatate (TESSALON) 100 MG capsule Take 1 capsule (100 mg total) by mouth every 8 (eight) hours. 02/27/16   Law, Waylan Boga, PA-C  clobetasol ointment (TEMOVATE) 0.05 % Apply 1 application topically 2 (two) times daily. 06/22/17   Fayrene Helper, PA-C  docusate sodium (COLACE) 100 MG capsule Take 1 capsule (100 mg total) by mouth daily. 02/27/16   Law, Waylan Boga, PA-C  lidocaine (XYLOCAINE) 2 % solution Use as directed 15 mLs in the mouth or throat as needed for mouth pain. 04/23/16   Joy, Shawn C, PA-C  lidocaine (XYLOCAINE) 2 % solution Use as directed 15 mLs in the mouth or throat every 4 (four) hours as needed. 12/07/17   Pisciotta, Joni Reining, PA-C  magic mouthwash SOLN Take 5 mLs by mouth 4 (four) times daily as needed for mouth pain. 02/27/16   Emi Holes, PA-C  magic mouthwash w/lidocaine SOLN Take 5 mLs by mouth 3 (three) times daily as needed for mouth pain. 01/30/18   Robinson, [place] N, PA-C  naproxen (NAPROSYN) 500 MG tablet Take 1 tablet (500 mg total) by mouth 2 (two) times daily. 05/23/16   Antony Madura, PA-C  predniSONE (DELTASONE) 20 MG tablet Take 3 tablets (60 mg total) by mouth daily. Take 60 mg by mouth daily week 1, then 40mg  by mouth daily for week 2, then 20mg  daily for week 3 01/30/18   Robinson, [place] N, PA-C  sucralfate (CARAFATE) 1  GM/10ML suspension Take 10 mLs (1 g total) by mouth 4 (four) times daily -  with meals and at bedtime. 06/22/17   Fayrene Helperran, Avalyn Molino, PA-C    Family History Family History  Problem Relation Age of Onset  . Heart disease Mother   . Lupus Neg Hx   . Crohn's disease Neg Hx     Social History Social History   Tobacco Use  . Smoking status: Current Every Day Smoker    Packs/day: 1.00    Types: Cigarettes  . Smokeless tobacco: Never Used  Substance Use Topics  . Alcohol use: No    Comment: occ  . Drug use: No     Allergies   Fluconazole   Review of Systems Review of Systems  HENT: Positive for sore throat and trouble swallowing.   All  other systems reviewed and are negative.    Physical Exam Updated Vital Signs BP 132/88 (BP Location: Left Arm)   Pulse 85   Resp 18   Ht 6' (1.829 m)   Wt 72.6 kg (160 lb)   SpO2 100%   BMI 21.70 kg/m   Physical Exam  Constitutional: He appears well-developed and well-nourished. No distress.  HENT:  Head: Atraumatic.  Mouth: Ulceration and desquamation of the mucosa within the mouth including the tongue consistent with Bercet's.  Eyes: Conjunctivae are normal.  Neck: Neck supple.  Cardiovascular: Normal rate and regular rhythm.  Pulmonary/Chest: Effort normal and breath sounds normal.  Abdominal: Soft. He exhibits no distension. There is no tenderness.  Neurological: He is alert.  Skin: No rash noted.  Psychiatric: He has a normal mood and affect.  Nursing note and vitals reviewed.    ED Treatments / Results  Labs (all labs ordered are listed, but only abnormal results are displayed) Labs Reviewed - No data to display  EKG None  Radiology No results found.  Procedures Procedures (including critical care time)  Medications Ordered in ED Medications - No data to display   Initial Impression / Assessment and Plan / ED Course  I have reviewed the triage vital signs and the nursing notes.  Pertinent labs & imaging results that were available during my care of the patient were reviewed by me and considered in my medical decision making (see chart for details).     BP (!) 144/99 (BP Location: Right Arm)   Pulse 67   Resp 16   Ht 6' (1.829 m)   Wt 72.6 kg (160 lb)   SpO2 100%   BMI 21.70 kg/m    Final Clinical Impressions(s) / ED Diagnoses   Final diagnoses:  Stomatitis    ED Discharge Orders        Ordered    magic mouthwash w/lidocaine SOLN  3 times daily PRN     04/05/18 1327    predniSONE (DELTASONE) 20 MG tablet  Daily     04/05/18 1327     1:22 PM Patient with known history of Bercet's disease with recurrent stomatitis here with request  for steroid as treatment for his stomatitis.  He was last seen on 6/14 for this condition and was given 5-day course of prednisone.  It did help however symptoms returned.  Per guidelines, patient will benefit from longer steroid course.  I will prescribe 12-day course of steroid and strongly encourage patient to follow-up closely with his hematologist for further management of his chronic condition.  Otherwise, no other source of infectious etiology were noted on today's exam.  He did mention  his brief headache I suspect it is tension headache.  No red flags.  No active headache at this time.  Recommend over-the-counter Tylenol as needed.   Fayrene Helper, PA-C 04/05/18 1328    Tegeler, Canary Brim, MD 04/05/18 [phone]

## 2018-04-05 NOTE — ED Triage Notes (Signed)
Mouth pain. States he has a hx of Behcet's Disease.

## 2018-04-05 NOTE — ED Notes (Signed)
ED Provider at bedside. 

## 2018-06-24 ENCOUNTER — Emergency Department (HOSPITAL_BASED_OUTPATIENT_CLINIC_OR_DEPARTMENT_OTHER)
Admission: EM | Admit: 2018-06-24 | Discharge: 2018-06-24 | Disposition: A | Discharge status: Home / Self Care | Payer: Self-pay | Attending: Emergency Medicine | Admitting: Emergency Medicine

## 2018-06-24 ENCOUNTER — Other Ambulatory Visit: Payer: Self-pay

## 2018-06-24 ENCOUNTER — Encounter (HOSPITAL_BASED_OUTPATIENT_CLINIC_OR_DEPARTMENT_OTHER): Payer: Self-pay

## 2018-06-24 DIAGNOSIS — F17210 Nicotine dependence, cigarettes, uncomplicated: Secondary | ICD-10-CM | POA: Insufficient documentation

## 2018-06-24 DIAGNOSIS — L989 Disorder of the skin and subcutaneous tissue, unspecified: Secondary | ICD-10-CM | POA: Insufficient documentation

## 2018-06-24 DIAGNOSIS — M352 Behcet's disease: Secondary | ICD-10-CM | POA: Insufficient documentation

## 2018-06-24 MED ORDER — PREDNISONE 50 MG PO TABS: 50.0000 mg | ORAL_TABLET | Freq: Every day | ORAL | 0 refills | Status: DC

## 2018-06-24 MED ORDER — COLCHICINE 0.6 MG PO TABS: 0.6000 mg | ORAL_TABLET | Freq: Two times a day (BID) | ORAL | 0 refills | Status: DC

## 2018-06-24 MED ORDER — TRIAMCINOLONE ACETONIDE 0.1 % EX CREA: 1.0000 "application " | TOPICAL_CREAM | Freq: Two times a day (BID) | CUTANEOUS | 0 refills | Status: DC

## 2018-06-24 NOTE — ED Provider Notes (Signed)
[facility] EMERGENCY DEPARTMENT Provider Note   CSN: [id number] Arrival date & time: 06/24/18  1121     History   Chief Complaint Chief Complaint  Patient presents with  . Mouth Lesions    HPI Agron Swiney is a 27 y.o. male.  HPI Patient presents to the emergency department with mouth ulcerations from Berchets Disease.  Patient states this is a recurrent problem for him.  The patient states he also has lesions on his arms and in his genital region.  Patient states that he normally gets steroids for this but does not take the mouth wash or rinses.  Patient states that nothing seems make the condition better or worse.  Patient states that he does have someone that he follows up with but has not seen them recently.  The patient denies chest pain, shortness of breath, headache,blurred vision, neck pain, fever, cough, weakness, numbness, dizziness, anorexia, edema, abdominal pain, nausea, vomiting, diarrhea, rash, back pain, dysuria, hematemesis, bloody stool, near syncope, or syncope. Past Medical History:  Diagnosis Date  . Behcet's disease (HCC)   . Herpes   . MRSA (methicillin resistant staph aureus) culture positive   . Recurrent mouth ulceration    "since 27 years old"  . Stomatitis and mucositis     Patient Active Problem List   Diagnosis Date Noted  . Behcet's disease (HCC) 09/27/2013  . Stomatitis and mucositis 03/15/2013  . Aphthous ulcer of mouth 03/15/2013  . Hypokalemia 03/12/2013  . Protein-calorie malnutrition, severe (HCC) 03/12/2013  . Acute kidney injury (HCC) 07/08/2012  . Hyperglycemia 07/08/2012  . Cigarette smoker 07/08/2012    Past Surgical History:  Procedure Laterality Date  . BIOPSY PHARYNX          Home Medications    Prior to Admission medications   Medication Sig Start Date End Date Taking? Authorizing Provider  benzonatate (TESSALON) 100 MG capsule Take 1 capsule (100 mg total) by mouth every 8 (eight) hours. 02/27/16    Law, Waylan Boga, PA-C  clobetasol ointment (TEMOVATE) 0.05 % Apply 1 application topically 2 (two) times daily. 06/22/17   Fayrene Helper, PA-C  docusate sodium (COLACE) 100 MG capsule Take 1 capsule (100 mg total) by mouth daily. 02/27/16   Law, Waylan Boga, PA-C  lidocaine (XYLOCAINE) 2 % solution Use as directed 15 mLs in the mouth or throat as needed for mouth pain. 04/23/16   Joy, Shawn C, PA-C  lidocaine (XYLOCAINE) 2 % solution Use as directed 15 mLs in the mouth or throat every 4 (four) hours as needed. 12/07/17   Pisciotta, Joni Reining, PA-C  magic mouthwash SOLN Take 5 mLs by mouth 4 (four) times daily as needed for mouth pain. 02/27/16   Emi Holes, PA-C  magic mouthwash w/lidocaine SOLN Take 5 mLs by mouth 3 (three) times daily as needed for mouth pain. 04/05/18   Fayrene Helper, PA-C  naproxen (NAPROSYN) 500 MG tablet Take 1 tablet (500 mg total) by mouth 2 (two) times daily. 05/23/16   Antony Madura, PA-C  predniSONE (DELTASONE) 20 MG tablet Take 3 tablets (60 mg total) by mouth daily. Take 60 mg by mouth daily week 1, then 40mg  by mouth daily for week 2, then 20mg  daily for week 3 04/05/18   Fayrene Helper, PA-C  sucralfate (CARAFATE) 1 GM/10ML suspension Take 10 mLs (1 g total) by mouth 4 (four) times daily -  with meals and at bedtime. 06/22/17   Fayrene Helper, PA-C    Family History Family History  Problem Relation Age of Onset  . Heart disease Mother   . Lupus Neg Hx   . Crohn's disease Neg Hx     Social History Social History   Tobacco Use  . Smoking status: Current Every Day Smoker    Packs/day: 1.00    Types: Cigarettes  . Smokeless tobacco: Never Used  Substance Use Topics  . Alcohol use: Yes    Comment: occ  . Drug use: No     Allergies   Fluconazole   Review of Systems Review of Systems All other systems negative except as documented in the HPI. All pertinent positives and negatives as reviewed in the HPI.  Physical Exam Updated Vital Signs BP 131/86 (BP Location:  Left Arm)   Pulse 86   Temp 98.3 F (36.8 C) (Oral)   Resp 18   Ht 6' (1.829 m)   Wt 68.9 kg   SpO2 95%   BMI 20.61 kg/m   Physical Exam  Constitutional: He is oriented to person, place, and time. He appears well-developed and well-nourished. No distress.  HENT:  Head: Normocephalic and atraumatic.  Mouth/Throat:    Eyes: Pupils are equal, round, and reactive to light.  Pulmonary/Chest: Effort normal.  Neurological: He is alert and oriented to person, place, and time.  Skin: Skin is warm and dry.     Psychiatric: He has a normal mood and affect.  Nursing note and vitals reviewed.    ED Treatments / Results  Labs (all labs ordered are listed, but only abnormal results are displayed) Labs Reviewed - No data to display  EKG None  Radiology No results found.  Procedures Procedures (including critical care time)  Medications Ordered in ED Medications - No data to display   Initial Impression / Assessment and Plan / ED Course  I have reviewed the triage vital signs and the nursing notes.  Pertinent labs & imaging results that were available during my care of the patient were reviewed by me and considered in my medical decision making (see chart for details).    Patient will be treated for this.  Have advised he will need to follow-up with his specialist who sees him for this.  Patient agrees the plan and all questions were answered.  Final Clinical Impressions(s) / ED Diagnoses   Final diagnoses:  None    ED Discharge Orders    None       Charlestine NightLawyer, Balian, PA-C 06/24/18 1245    Pricilla LovelessGoldston, Scott, MD 06/24/18 1317

## 2018-06-24 NOTE — ED Triage Notes (Signed)
C/o "break out of Behcets"-c/o sores inside mouth and on hands-NAD-steady gait

## 2018-06-24 NOTE — Discharge Instructions (Addendum)
You will need to see your doctor for a recheck as soon as possible.  Follow-up as needed here.

## 2018-08-22 ENCOUNTER — Emergency Department (HOSPITAL_BASED_OUTPATIENT_CLINIC_OR_DEPARTMENT_OTHER)
Admission: EM | Admit: 2018-08-22 | Discharge: 2018-08-22 | Disposition: A | Discharge status: Home / Self Care | Payer: Medicaid Other | Attending: Emergency Medicine | Admitting: Emergency Medicine

## 2018-08-22 ENCOUNTER — Other Ambulatory Visit: Payer: Self-pay

## 2018-08-22 ENCOUNTER — Encounter (HOSPITAL_BASED_OUTPATIENT_CLINIC_OR_DEPARTMENT_OTHER): Payer: Self-pay | Admitting: *Deleted

## 2018-08-22 DIAGNOSIS — K1230 Oral mucositis (ulcerative), unspecified: Secondary | ICD-10-CM | POA: Insufficient documentation

## 2018-08-22 DIAGNOSIS — K121 Other forms of stomatitis: Secondary | ICD-10-CM

## 2018-08-22 DIAGNOSIS — Z79899 Other long term (current) drug therapy: Secondary | ICD-10-CM | POA: Insufficient documentation

## 2018-08-22 DIAGNOSIS — F17210 Nicotine dependence, cigarettes, uncomplicated: Secondary | ICD-10-CM | POA: Insufficient documentation

## 2018-08-22 MED ORDER — PREDNISONE 20 MG PO TABS: 60.0000 mg | ORAL_TABLET | Freq: Every day | ORAL | 0 refills | Status: DC

## 2018-08-22 NOTE — ED Triage Notes (Signed)
Ulcers on his tongue and lips x 1 week. [facility] and given a Rx for antibiotics and steroid with no improvement.

## 2018-08-22 NOTE — ED Provider Notes (Signed)
[facility] EMERGENCY DEPARTMENT Provider Note   CSN: [id number] Arrival date & time: 08/22/18  1812     History   Chief Complaint No chief complaint on file.   HPI Ihsan Nomura is a 27 y.o. male who presents with mouth sores. PMH significant for Behcet's disease. He states that he's had an outbreak for the past one week. He went to [facility] on 11/5 and was prescribed methylprednisolone and Magic mouthwash with Lidocaine. He states he finished this but did not improve. He went back to [facility] hospital on 11/9 because he was not improving. He was given IV steroids, IVF. He initially said he was not using magic mouthwash but then told the provider he was using it. He was prescribed oral nystatin for superimposed thrush. He is able to swallow. No fevers. He states he normally gets Prednisone and gets better. He's never had Methylprednisolone before.  HPI  Past Medical History:  Diagnosis Date  . Behcet's disease (HCC)   . Herpes   . MRSA (methicillin resistant staph aureus) culture positive   . Recurrent mouth ulceration    "since 27 years old"  . Stomatitis and mucositis     Patient Active Problem List   Diagnosis Date Noted  . Behcet's disease (HCC) 09/27/2013  . Stomatitis and mucositis 03/15/2013  . Aphthous ulcer of mouth 03/15/2013  . Hypokalemia 03/12/2013  . Protein-calorie malnutrition, severe (HCC) 03/12/2013  . Acute kidney injury (HCC) 07/08/2012  . Hyperglycemia 07/08/2012  . Cigarette smoker 07/08/2012    Past Surgical History:  Procedure Laterality Date  . BIOPSY PHARYNX          Home Medications    Prior to Admission medications   Medication Sig Start Date End Date Taking? Authorizing Provider  benzonatate (TESSALON) 100 MG capsule Take 1 capsule (100 mg total) by mouth every 8 (eight) hours. 02/27/16   Law, Waylan Boga, PA-C  clobetasol ointment (TEMOVATE) 0.05 % Apply 1 application topically 2 (two) times daily. 06/22/17   Fayrene Helper,  PA-C  colchicine 0.6 MG tablet Take 1 tablet (0.6 mg total) by mouth 2 (two) times daily. 06/24/18   Lawyer, Cristal Deer, PA-C  docusate sodium (COLACE) 100 MG capsule Take 1 capsule (100 mg total) by mouth daily. 02/27/16   Law, Waylan Boga, PA-C  lidocaine (XYLOCAINE) 2 % solution Use as directed 15 mLs in the mouth or throat as needed for mouth pain. 04/23/16   Joy, Shawn C, PA-C  lidocaine (XYLOCAINE) 2 % solution Use as directed 15 mLs in the mouth or throat every 4 (four) hours as needed. 12/07/17   Pisciotta, Joni Reining, PA-C  magic mouthwash SOLN Take 5 mLs by mouth 4 (four) times daily as needed for mouth pain. 02/27/16   Emi Holes, PA-C  magic mouthwash w/lidocaine SOLN Take 5 mLs by mouth 3 (three) times daily as needed for mouth pain. 04/05/18   Fayrene Helper, PA-C  naproxen (NAPROSYN) 500 MG tablet Take 1 tablet (500 mg total) by mouth 2 (two) times daily. 05/23/16   Antony Madura, PA-C  predniSONE (DELTASONE) 20 MG tablet Take 3 tablets (60 mg total) by mouth daily. Take 60 mg by mouth daily week 1, then 40mg  by mouth daily for week 2, then 20mg  daily for week 3 04/05/18   Fayrene Helper, PA-C  predniSONE (DELTASONE) 50 MG tablet Take 1 tablet (50 mg total) by mouth daily. 06/24/18   Lawyer, Cristal Deer, PA-C  sucralfate (CARAFATE) 1 GM/10ML suspension Take 10 mLs (1 g total)  by mouth 4 (four) times daily -  with meals and at bedtime. 06/22/17   Fayrene Helperran, Bowie, PA-C  triamcinolone cream (KENALOG) 0.1 % Apply 1 application topically 2 (two) times daily. 06/24/18   Charlestine NightLawyer, Freemon, PA-C    Family History Family History  Problem Relation Age of Onset  . Heart disease Mother   . Lupus Neg Hx   . Crohn's disease Neg Hx     Social History Social History   Tobacco Use  . Smoking status: Current Every Day Smoker    Packs/day: 1.00    Types: Cigarettes  . Smokeless tobacco: Never Used  Substance Use Topics  . Alcohol use: Yes    Comment: occ  . Drug use: No     Allergies    Fluconazole   Review of Systems Review of Systems  Constitutional: Negative for fever.  HENT: Positive for mouth sores.      Physical Exam Updated Vital Signs BP 119/86   Pulse (!) 110   Temp 98.3 F (36.8 C) (Oral)   Resp 20   Ht 6' (1.829 m)   Wt 63.5 kg   SpO2 100%   BMI 18.99 kg/m   Physical Exam  Constitutional: He is oriented to person, place, and time. He appears well-developed and well-nourished. No distress.  HENT:  Head: Normocephalic and atraumatic.  Ulcerations over the lips, buccal mucosa, and hard palate. Tongue has coating consistent with thrush  Eyes: Pupils are equal, round, and reactive to light. Conjunctivae are normal. Right eye exhibits no discharge. Left eye exhibits no discharge. No scleral icterus.  Neck: Normal range of motion.  Cardiovascular: Normal rate.  Pulmonary/Chest: Effort normal. No respiratory distress.  Abdominal: He exhibits no distension.  Neurological: He is alert and oriented to person, place, and time.  Skin: Skin is warm and dry.  Psychiatric: He has a normal mood and affect. His behavior is normal.  Nursing note and vitals reviewed.    ED Treatments / Results  Labs (all labs ordered are listed, but only abnormal results are displayed) Labs Reviewed - No data to display  EKG None  Radiology No results found.  Procedures Procedures (including critical care time)  Medications Ordered in ED Medications - No data to display   Initial Impression / Assessment and Plan / ED Course  I have reviewed the triage vital signs and the nursing notes.  Pertinent labs & imaging results that were available during my care of the patient were reviewed by me and considered in my medical decision making (see chart for details).  27 year old male presents with flare of his Behcet's. He is mildly tachycardic which is likely from mild dehydration due to decreased oral intake. He had normal labs on 11/9 at [facility]. He states he doesn't  want to take Methylprednisolone because it's not working and wants to take Prednisone instead. He will be given a Prednisone taper and encouraged to use magic mouthwash. He requested a work note which was given.  Final Clinical Impressions(s) / ED Diagnoses   Final diagnoses:  Stomatitis and mucositis    ED Discharge Orders    None       Bethel BornGekas, Akshitha Culmer Marie, PA-C 08/22/18 [id number]    Alvira MondaySchlossman, Erin, MD 08/23/18 1158

## 2018-08-22 NOTE — Discharge Instructions (Addendum)
Take prednisone as prescribed. Return if you are worsening

## 2018-10-26 ENCOUNTER — Emergency Department (HOSPITAL_BASED_OUTPATIENT_CLINIC_OR_DEPARTMENT_OTHER)
Admission: EM | Admit: 2018-10-26 | Discharge: 2018-10-26 | Disposition: A | Discharge status: Home / Self Care | Payer: Medicaid Other | Attending: Emergency Medicine | Admitting: Emergency Medicine

## 2018-10-26 ENCOUNTER — Other Ambulatory Visit: Payer: Self-pay

## 2018-10-26 ENCOUNTER — Encounter (HOSPITAL_BASED_OUTPATIENT_CLINIC_OR_DEPARTMENT_OTHER): Payer: Self-pay | Admitting: Emergency Medicine

## 2018-10-26 DIAGNOSIS — K1230 Oral mucositis (ulcerative), unspecified: Secondary | ICD-10-CM | POA: Insufficient documentation

## 2018-10-26 DIAGNOSIS — Z79899 Other long term (current) drug therapy: Secondary | ICD-10-CM | POA: Insufficient documentation

## 2018-10-26 DIAGNOSIS — M352 Behcet's disease: Secondary | ICD-10-CM | POA: Insufficient documentation

## 2018-10-26 DIAGNOSIS — K121 Other forms of stomatitis: Secondary | ICD-10-CM | POA: Insufficient documentation

## 2018-10-26 DIAGNOSIS — F17210 Nicotine dependence, cigarettes, uncomplicated: Secondary | ICD-10-CM | POA: Insufficient documentation

## 2018-10-26 MED ORDER — PREDNISONE 20 MG PO TABS: 60.0000 mg | ORAL_TABLET | Freq: Every day | ORAL | 0 refills | Status: DC

## 2018-10-26 MED ORDER — MAGIC MOUTHWASH W/LIDOCAINE: 5.0000 mL | Freq: Three times a day (TID) | ORAL | 0 refills | Status: DC | PRN

## 2018-10-26 MED ORDER — METHYLPREDNISOLONE SODIUM SUCC 125 MG IJ SOLR
125.0000 mg | Freq: Once | INTRAMUSCULAR | Status: AC
Start: 2018-10-26 — End: 2018-10-26
  Administered 2018-10-26: 125 mg via INTRAVENOUS
  Filled 2018-10-26: qty 2

## 2018-10-26 MED ORDER — LIDOCAINE VISCOUS HCL 2 % MT SOLN
15.0000 mL | Freq: Once | OROMUCOSAL | Status: AC
  Administered 2018-10-26: 15 mL via OROMUCOSAL
  Filled 2018-10-26: qty 15

## 2018-10-26 MED ORDER — SODIUM CHLORIDE 0.9 % IV BOLUS
1000.0000 mL | Freq: Once | INTRAVENOUS | Status: AC
  Administered 2018-10-26: 1000 mL via INTRAVENOUS

## 2018-10-26 NOTE — ED Notes (Addendum)
Pt c/o mouth ulcers that he states are on his tongue .Pt states he has Behcet's Syndrome. Pt presents with redness and ulcers present on tongue. Pt denies buccal ulcers or any presence of ulcers on gums. Pt states he has difficulty swallowing r/t ulcers on tongue. Pt denies throat pain or difficulty breathing

## 2018-10-26 NOTE — ED Provider Notes (Signed)
[facility] EMERGENCY DEPARTMENT Provider Note   CSN: [id number] Arrival date & time: 10/26/18  1637     History   Chief Complaint Chief Complaint  Patient presents with  . Mouth Lesions    HPI Paul House is a 28 y.o. male.  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  He reports it is painful to swallow or eat, he is still able to swallow liquids but has had a hard time eating any solids due to discomfort.  Symptoms are constant, nothing makes them better or worse he has not tried any medications prior to arrival.  He reports he usually gets these about once a month and typically has to get steroids and uses Magic mouthwash.  He also requires IV fluids intermittently due to dehydration.  Per chart review patient has been seen in the department numerous times with similar presentations he denies any other symptoms and reports this is typical for his Behcet's flares and denies any other acute changes.  Patient reports his symptoms usually improve with prednisone.  He typically requires a longer taper and a short burst does not resolve symptoms.  No fevers or chills, no nausea or vomiting, no chest pain, shortness of breath, cough, no abdominal pain.  Past Medical History:  Diagnosis Date  . Behcet's disease (HCC)   . Herpes   . MRSA (methicillin resistant staph aureus) culture positive   . Recurrent mouth ulceration    "since 28 years old"  . Stomatitis and mucositis     Patient Active Problem List   Diagnosis Date Noted  . Behcet's disease (HCC) 09/27/2013  . Stomatitis and mucositis 03/15/2013  . Aphthous ulcer of mouth 03/15/2013  . Hypokalemia 03/12/2013  . Protein-calorie malnutrition, severe (HCC) 03/12/2013  . Acute kidney injury (HCC) 07/08/2012  . Hyperglycemia  07/08/2012  . Cigarette smoker 07/08/2012    Past Surgical History:  Procedure Laterality Date  . BIOPSY PHARYNX          Home Medications    Prior to Admission medications   Medication Sig Start Date End Date Taking? Authorizing Provider  benzonatate (TESSALON) 100 MG capsule Take 1 capsule (100 mg total) by mouth every 8 (eight) hours. 02/27/16   Law, Waylan BogaAlexandra M, PA-C  clobetasol ointment (TEMOVATE) 0.05 % Apply 1 application topically 2 (two) times daily. 06/22/17   Fayrene Helperran, Bowie, PA-C  colchicine 0.6 MG tablet Take 1 tablet (0.6 mg total) by mouth 2 (two) times daily. 06/24/18   Lawyer, Cristal Deerhristopher, PA-C  docusate sodium (COLACE) 100 MG capsule Take 1 capsule (100 mg total) by mouth daily. 02/27/16   Law, Waylan BogaAlexandra M, PA-C  lidocaine (XYLOCAINE) 2 % solution Use as directed 15 mLs in the mouth or throat as needed for mouth pain. 04/23/16   Joy, Shawn C, PA-C  lidocaine (XYLOCAINE) 2 % solution Use as directed 15 mLs in the mouth or throat every 4 (four) hours as needed. 12/07/17   Pisciotta, Joni ReiningNicole, PA-C  magic mouthwash SOLN Take 5 mLs by mouth 4 (four) times daily as needed for mouth pain. 02/27/16   Emi HolesLaw, Alexandra M, PA-C  magic mouthwash w/lidocaine SOLN Take 5 mLs by mouth 3 (three) times daily as needed for mouth pain. 04/05/18   Fayrene Helperran, Bowie, PA-C  naproxen (NAPROSYN) 500 MG tablet Take 1 tablet (500 mg total) by  mouth 2 (two) times daily. 05/23/16   Antony MaduraHumes, Kelly, PA-C  predniSONE (DELTASONE) 20 MG tablet Take 3 tablets (60 mg total) by mouth daily. Take 60 mg by mouth daily week 1, then 40mg  by mouth daily for week 2, then 20mg  daily for week 3 08/22/18   Bethel BornGekas, Kelly Marie, PA-C  predniSONE (DELTASONE) 50 MG tablet Take 1 tablet (50 mg total) by mouth daily. 06/24/18   Lawyer, Cristal Deerhristopher, PA-C  sucralfate (CARAFATE) 1 GM/10ML suspension Take 10 mLs (1 g total) by mouth 4 (four) times daily -  with meals and at bedtime. 06/22/17   Fayrene Helperran, Bowie, PA-C  triamcinolone cream (KENALOG) 0.1 %  Apply 1 application topically 2 (two) times daily. 06/24/18   Charlestine NightLawyer, Davide, PA-C    Family History Family History  Problem Relation Age of Onset  . Heart disease Mother   . Lupus Neg Hx   . Crohn's disease Neg Hx     Social History Social History   Tobacco Use  . Smoking status: Current Every Day Smoker    Packs/day: 1.00    Types: Cigarettes  . Smokeless tobacco: Never Used  Substance Use Topics  . Alcohol use: Yes    Comment: occ  . Drug use: No     Allergies   Fluconazole   Review of Systems Review of Systems  Constitutional: Negative for chills and fever.  HENT: Positive for mouth sores and trouble swallowing. Negative for sore throat and voice change.   Respiratory: Negative for shortness of breath and stridor.   Gastrointestinal: Negative for nausea and vomiting.  Skin: Negative for color change and rash.  All other systems reviewed and are negative.    Physical Exam Updated Vital Signs BP (!) 141/126 (BP Location: Left Arm)   Pulse (!) 116   Temp 98.6 F (37 C) (Oral)   Resp 18   Ht 6' (1.829 m)   Wt 68 kg   SpO2 100%   BMI 20.34 kg/m   Physical Exam Vitals signs and nursing note reviewed.  Constitutional:      General: He is not in acute distress.    Appearance: Normal appearance. He is well-developed and normal weight. He is not toxic-appearing or diaphoretic.  HENT:     Head: Normocephalic and atraumatic.     Mouth/Throat:     Comments: Mucous membranes somewhat dry, numerous oral lesions noted over the tongue and buccal mucosa, no swelling, tolerating secretions without difficulty, voice is muted due to discomfort with talking, patient also has some coating on the tongue concerning for superimposed thrush Eyes:     General:        Right eye: No discharge.        Left eye: No discharge.  Neck:     Musculoskeletal: Neck supple.  Cardiovascular:     Rate and Rhythm: Normal rate and regular rhythm.     Pulses: Normal pulses.      Heart sounds: Normal heart sounds. No murmur. No friction rub. No gallop.   Pulmonary:     Effort: Pulmonary effort is normal. No respiratory distress.     Breath sounds: Normal breath sounds.     Comments: Respirations equal and unlabored, patient able to speak in full sentences, lungs clear to auscultation bilaterally Abdominal:     General: Abdomen is flat. Bowel sounds are normal.     Palpations: Abdomen is soft.     Tenderness: There is no abdominal tenderness. There is no guarding.  Skin:  General: Skin is warm and dry.     Capillary Refill: Capillary refill takes less than 2 seconds.  Neurological:     Mental Status: He is alert.     Coordination: Coordination normal.  Psychiatric:        Mood and Affect: Mood normal.        Behavior: Behavior normal.      ED Treatments / Results  Labs (all labs ordered are listed, but only abnormal results are displayed) Labs Reviewed - No data to display  EKG None  Radiology No results found.  Procedures Procedures (including critical care time)  Medications Ordered in ED Medications  sodium chloride 0.9 % bolus 1,000 mL (0 mLs Intravenous Stopped 10/26/18 1946)  methylPREDNISolone sodium succinate (SOLU-MEDROL) 125 mg/2 mL injection 125 mg (125 mg Intravenous Given 10/26/18 1826)  lidocaine (XYLOCAINE) 2 % viscous mouth solution 15 mL (15 mLs Mouth/Throat Given 10/26/18 1944)     Initial Impression / Assessment and Plan / ED Course  I have reviewed the triage vital signs and the nursing notes.  Pertinent labs & imaging results that were available during my care of the patient were reviewed by me and considered in my medical decision making (see chart for details).  Patient presents with flare of his Behcet's disease with painful mouth lesions which have been worsening over the past week.  Has been seen numerous times for the same and is usually given prednisone with improvement in his symptoms as well as Magic mouthwash.  On  exam patient has multiple oral lesions and also has a coating on the tongue concerning for concurrent thrush.  Patient reports he has been able to swallow liquids but eating foods has become increasingly painful.  No other acute symptoms, no fevers or chills.  Patient tachycardic on arrival and does appear mildly dehydrated, will give IV fluids and dose of Solu-Medrol as well as viscous lidocaine for symptomatic management.  Patient patient reports some improvement in discomfort his tachycardia has resolved with fluids.  Will send patient home with steroid taper, as well as Magic mouthwash with lidocaine.  Nystatin and Magic mouthwash should help with thrush.  Stressed the importance of follow-up with the PCP.  Return precautions discussed.  At this time patient appears stable for discharge home, patient family expressed understanding and are in agreement with plan.  Final Clinical Impressions(s) / ED Diagnoses   Final diagnoses:  Stomatitis and mucositis  Behcet recurrent disease [facility])    ED Discharge Orders    None       Legrand Rams 10/26/18 1951    Gwyneth Sprout, MD 10/26/18 2251

## 2018-10-26 NOTE — Discharge Instructions (Signed)
Take steroid taper as directed and use Magic mouthwash 4 times daily as needed to help with discomfort.  Make sure you are drinking plenty of fluids and increase diet as tolerated as pain improves.  It is very important that you have a primary care doctor to follow-up with regarding the symptoms, use the phone number provided on your paperwork today to reestablish care with someone for continued management of your Behcet's disease.  Return to the emergency department if symptoms worsen you are unable to tolerate fluids you develop fevers or any other new or concerning symptoms.

## 2018-10-26 NOTE — ED Triage Notes (Signed)
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 The patient reports that he gets an ulcer monthly

## 2018-10-26 NOTE — ED Notes (Signed)
Unsuccessful IV attempt.

## 2019-03-08 ENCOUNTER — Encounter (HOSPITAL_BASED_OUTPATIENT_CLINIC_OR_DEPARTMENT_OTHER): Payer: Self-pay | Admitting: *Deleted

## 2019-03-08 ENCOUNTER — Other Ambulatory Visit: Payer: Self-pay

## 2019-03-08 ENCOUNTER — Emergency Department (HOSPITAL_BASED_OUTPATIENT_CLINIC_OR_DEPARTMENT_OTHER)
Admission: EM | Admit: 2019-03-08 | Discharge: 2019-03-08 | Disposition: A | Discharge status: Home / Self Care | Payer: Medicaid Other | Attending: Emergency Medicine | Admitting: Emergency Medicine

## 2019-03-08 DIAGNOSIS — F17210 Nicotine dependence, cigarettes, uncomplicated: Secondary | ICD-10-CM | POA: Insufficient documentation

## 2019-03-08 DIAGNOSIS — Z79899 Other long term (current) drug therapy: Secondary | ICD-10-CM | POA: Insufficient documentation

## 2019-03-08 DIAGNOSIS — K121 Other forms of stomatitis: Secondary | ICD-10-CM | POA: Insufficient documentation

## 2019-03-08 DIAGNOSIS — M352 Behcet's disease: Secondary | ICD-10-CM | POA: Insufficient documentation

## 2019-03-08 DIAGNOSIS — K1230 Oral mucositis (ulcerative), unspecified: Secondary | ICD-10-CM | POA: Insufficient documentation

## 2019-03-08 MED ORDER — PREDNISONE 10 MG (21) PO TBPK: ORAL_TABLET | Freq: Every day | ORAL | 0 refills | Status: DC

## 2019-03-08 MED ORDER — MAGIC MOUTHWASH W/LIDOCAINE: 5.0000 mL | Freq: Three times a day (TID) | ORAL | 0 refills | Status: DC | PRN

## 2019-03-08 NOTE — ED Triage Notes (Signed)
Pt reports he is having a flare up of Bechets disease x 3 days

## 2019-03-08 NOTE — ED Notes (Signed)
Pt  C/o mouth sores on lower right side buccal and on  Tongue x 3 days. Denis otc medications for treatment

## 2019-03-08 NOTE — ED Provider Notes (Signed)
[facility] EMERGENCY DEPARTMENT Provider Note   CSN: [id number] Arrival date & time: 03/08/19  1729    History   Chief Complaint Chief Complaint  Patient presents with  . Mouth Lesions    HPI Paul House is a 28 y.o. male with a hx of tobacco abuse, behcet's disease w/ recurrent mouth ulceration who presents to the ED w/ complaints of painful intra-oral ulcers x 3 days. Patient reports sxs are constant, no alleviating/aggravating factors. Similar to prior behcet flares. States this is fairly early in a flare, he is still eating and drinking normally and does not feel he requires IV fluids as he sometimes does, he often requires 3 week steroid taper, but states that if caught early enough like it is currently it can sometimes resolve w/ short 6 day taper. Denies fevers, chills, nausea, vomiting, difficulty swallowing, dyspnea, joint pain, or visual disturbance.  Patient was previously on Humira but lost his insurance a few years ago.  He does not have a primary care doctor.  HPI  Past Medical History:  Diagnosis Date  . Behcet's disease (HCC)   . Herpes   . MRSA (methicillin resistant staph aureus) culture positive   . Recurrent mouth ulceration    "since 28 years old"  . Stomatitis and mucositis     Patient Active Problem List   Diagnosis Date Noted  . Behcet's disease (HCC) 09/27/2013  . Stomatitis and mucositis 03/15/2013  . Aphthous ulcer of mouth 03/15/2013  . Hypokalemia 03/12/2013  . Protein-calorie malnutrition, severe (HCC) 03/12/2013  . Acute kidney injury (HCC) 07/08/2012  . Hyperglycemia 07/08/2012  . Cigarette smoker 07/08/2012    Past Surgical History:  Procedure Laterality Date  . BIOPSY PHARYNX          Home Medications    Prior to Admission medications   Medication Sig Start Date End Date Taking? Authorizing Provider  benzonatate (TESSALON) 100 MG capsule Take 1 capsule (100 mg total) by mouth every 8 (eight) hours. 02/27/16    Law, Waylan Boga, PA-C  clobetasol ointment (TEMOVATE) 0.05 % Apply 1 application topically 2 (two) times daily. 06/22/17   Fayrene Helper, PA-C  colchicine 0.6 MG tablet Take 1 tablet (0.6 mg total) by mouth 2 (two) times daily. 06/24/18   Lawyer, Cristal Deer, PA-C  docusate sodium (COLACE) 100 MG capsule Take 1 capsule (100 mg total) by mouth daily. 02/27/16   Law, Waylan Boga, PA-C  lidocaine (XYLOCAINE) 2 % solution Use as directed 15 mLs in the mouth or throat as needed for mouth pain. 04/23/16   Joy, Shawn C, PA-C  lidocaine (XYLOCAINE) 2 % solution Use as directed 15 mLs in the mouth or throat every 4 (four) hours as needed. 12/07/17   Pisciotta, Joni Reining, PA-C  magic mouthwash SOLN Take 5 mLs by mouth 4 (four) times daily as needed for mouth pain. 02/27/16   Emi Holes, PA-C  magic mouthwash w/lidocaine SOLN Take 5 mLs by mouth 3 (three) times daily as needed for mouth pain. Diphenhydramine: Alum & mag hydroxide: Nystatin: Lidocaine 1:1:1:1 10/26/18   Dartha Lodge, PA-C  naproxen (NAPROSYN) 500 MG tablet Take 1 tablet (500 mg total) by mouth 2 (two) times daily. 05/23/16   Antony Madura, PA-C  predniSONE (DELTASONE) 20 MG tablet Take 3 tablets (60 mg total) by mouth daily. Take 60 mg by mouth daily week 1, then  by mouth daily for week 2, then  daily for week 3 10/26/18   Dartha Lodge, PA-C  sucralfate (CARAFATE) 1 GM/10ML suspension Take 10 mLs (1 g total) by mouth 4 (four) times daily -  with meals and at bedtime. 06/22/17   Fayrene Helperran, Bowie, PA-C  triamcinolone cream (KENALOG) 0.1 % Apply 1 application topically 2 (two) times daily. 06/24/18   Charlestine NightLawyer, Jerimy, PA-C    Family History Family History  Problem Relation Age of Onset  . Heart disease Mother   . Lupus Neg Hx   . Crohn's disease Neg Hx     Social History Social History   Tobacco Use  . Smoking status: Current Every Day Smoker    Packs/day: 1.00    Types: Cigarettes  . Smokeless tobacco: Never Used  Substance Use  Topics  . Alcohol use: Yes    Comment: occ  . Drug use: No     Allergies   Fluconazole   Review of Systems Review of Systems  Constitutional: Negative for chills and fever.  HENT: Positive for mouth sores. Negative for sore throat, trouble swallowing and voice change.   Eyes: Negative for visual disturbance.  Respiratory: Negative for shortness of breath.   Cardiovascular: Negative for chest pain.  Gastrointestinal: Negative for abdominal pain, diarrhea and vomiting.  Genitourinary: Negative for genital sores.  Musculoskeletal: Negative for arthralgias.     Physical Exam Updated Vital Signs BP 137/84 (BP Location: Left Arm)   Pulse 90   Temp 98.7 F (37.1 C) (Oral)   Resp 16   Ht 6' (1.829 m)   Wt 72.6 kg   SpO2 99%   BMI 21.70 kg/m   Physical Exam Vitals signs and nursing note reviewed.  Constitutional:      General: He is not in acute distress.    Appearance: He is well-developed. He is not toxic-appearing.  HENT:     Head: Normocephalic and atraumatic.     Right Ear: Tympanic membrane is not perforated, erythematous, retracted or bulging.     Left Ear: Tympanic membrane is not perforated, erythematous, retracted or bulging.     Nose: Nose normal.     Mouth/Throat:     Mouth: Mucous membranes are moist.     Pharynx: Uvula midline. No oropharyngeal exudate or posterior oropharyngeal erythema.     Comments: Mucous membranes w/ multiple oral lesions noted over the tongue and buccal mucosa bilaterally, no swelling, tolerating secretions without difficulty, patient also has some coating on the tongue. Patient tolerating own secretions without difficulty. No trismus. No drooling. No hot potato voice. No swelling beneath the tongue, submandibular compartment is soft.   Eyes:     General:        Right eye: No discharge.        Left eye: No discharge.     Conjunctiva/sclera: Conjunctivae normal.     Right eye: Right conjunctiva is not injected.     Left eye: Left  conjunctiva is not injected.  Neck:     Musculoskeletal: Normal range of motion and neck supple.  Cardiovascular:     Rate and Rhythm: Normal rate and regular rhythm.  Pulmonary:     Effort: Pulmonary effort is normal.     Breath sounds: Normal breath sounds.  Abdominal:     General: There is no distension.     Palpations: Abdomen is soft.  Genitourinary:    Comments: Deferred Musculoskeletal:     Comments: Normal active range of motion throughout without point/focal bony tenderness.  Lymphadenopathy:     Cervical: No cervical adenopathy.  Neurological:     Mental  Status: He is alert.  Psychiatric:        Behavior: Behavior normal.        Thought Content: Thought content normal.      ED Treatments / Results  Labs (all labs ordered are listed, but only abnormal results are displayed) Labs Reviewed - No data to display  EKG None  Radiology No results found.  Procedures Procedures (including critical care time)  Medications Ordered in ED Medications - No data to display   Initial Impression / Assessment and Plan / ED Course  I have reviewed the triage vital signs and the nursing notes.  Pertinent labs & imaging results that were available during my care of the patient were reviewed by me and considered in my medical decision making (see chart for details).   Patient with a history of Behcet's disease, currently not on chronic therapy for this, presents to the emergency department with 3-day history of painful oral ulcers-he states the symptoms are consistent with prior Behcet's flares, no complaints of genital ulcers, no arthritis, no signs of uveitis. He explains to me that he feels this is early enough in the flare that he would likely improve with a short taper of prednisone as opposed to a 3-week taper as he has received in the past.  He is drinking and eating appropriately, mucous membranes are moist, do not feel that IV fluids are necessary at this time.   Nontoxic-appearing, vitals WNL, afebrile. There is coating of the tongue which is suspect may be associated thrush. Does not appear to have superimposed bacterial infection. Will tx w/ short prednisone taper per patient request, will also provide magic mouth wash w/ nystatin for possible thrush.  We discussed the importance of establishing primary care, information for [facility] provided. I discussed treatment plan, need for follow-up, and return precautions with the patient. Provided opportunity for questions, patient confirmed understanding and is in agreement with plan.      Findings and plan of care discussed with supervising physician Dr. Silverio Lay who is in agreement.   Final Clinical Impressions(s) / ED Diagnoses   Final diagnoses:  Behcet recurrent disease (HCC)  Stomatitis and mucositis    ED Discharge Orders         Ordered    magic mouthwash w/lidocaine SOLN  3 times daily PRN     03/08/19 1844    predniSONE (STERAPRED UNI-PAK 21 TAB) 10 MG (21) TBPK tablet  Daily     03/08/19 1844           Cherly Anderson, PA-C 03/08/19 1847    Charlynne Pander, MD 03/08/19 2232

## 2019-03-08 NOTE — Discharge Instructions (Signed)
You are seen in the emergency department today for oral ulcers.  We have placed you on a short prednisone taper, please take this as prescribed.  We have also placed you on a mouthwash to use to help with discomfort as well as possible yeast in the mouth otherwise noticed thrush.,  Use 3 times per day.  We have prescribed you new medication(s) today. Discuss the medications prescribed today with your pharmacist as they can have adverse effects and interactions with your other medicines including over the counter and prescribed medications. Seek medical evaluation if you start to experience new or abnormal symptoms after taking one of these medicines, seek care immediately if you start to experience difficulty breathing, feeling of your throat closing, facial swelling, or rash as these could be indications of a more serious allergic reaction  Follow-up with the [facility] as they can see you without insurance, it is important that you establish a primary care provider.  Please follow-up within 3 to 5 days.  Return to the ER for new or worsening symptoms including but not limited to inability to tolerate food or fluids, fever, eye pain, genital sores, vomiting, increased pain to the mouth, or any other concerns.

## 2019-03-08 NOTE — ED Notes (Signed)
Pt c/o pain inside of mouth-right side buccal pain and tongue pain r/t sores in mouth. Pt states pain worse when trying to eat and when speaking. Pt difficult to understand.

## 2019-03-22 ENCOUNTER — Other Ambulatory Visit: Payer: Self-pay

## 2019-03-22 ENCOUNTER — Encounter (HOSPITAL_BASED_OUTPATIENT_CLINIC_OR_DEPARTMENT_OTHER): Payer: Self-pay | Admitting: Emergency Medicine

## 2019-03-22 ENCOUNTER — Emergency Department (HOSPITAL_BASED_OUTPATIENT_CLINIC_OR_DEPARTMENT_OTHER)
Admission: EM | Admit: 2019-03-22 | Discharge: 2019-03-22 | Disposition: A | Discharge status: Home / Self Care | Payer: Medicaid Other | Attending: Emergency Medicine | Admitting: Emergency Medicine

## 2019-03-22 DIAGNOSIS — F17210 Nicotine dependence, cigarettes, uncomplicated: Secondary | ICD-10-CM | POA: Insufficient documentation

## 2019-03-22 DIAGNOSIS — K121 Other forms of stomatitis: Secondary | ICD-10-CM | POA: Insufficient documentation

## 2019-03-22 DIAGNOSIS — Z79899 Other long term (current) drug therapy: Secondary | ICD-10-CM | POA: Insufficient documentation

## 2019-03-22 MED ORDER — PREDNISONE 20 MG PO TABS: ORAL_TABLET | ORAL | 0 refills | Status: DC

## 2019-03-22 MED ORDER — MAGIC MOUTHWASH W/LIDOCAINE: 5.0000 mL | Freq: Three times a day (TID) | ORAL | 0 refills | Status: DC | PRN

## 2019-03-22 NOTE — ED Triage Notes (Addendum)
Mouth pain and swelling. His lower lip is cracked with yellow drainage. He suffers from recurrent stomatitis.

## 2019-03-22 NOTE — ED Provider Notes (Signed)
[facility] EMERGENCY DEPARTMENT Provider Note   CSN: [id number] Arrival date & time: 03/22/19  5284     History   Chief Complaint Chief Complaint  Patient presents with  . Mouth pain    HPI Paul House is a 28 y.o. male.     Patient is a 28 year old male who presents with mouth pain.  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  He has been on recurrent steroids.  He was previously on Humira but has not recently had a PCP and is been off this for some time.  He has had frequent ED visits recently for recurrent stomatitis.  He does see he is able to drink normally.  He was last on steroids starting May 30.  He states that he feels like he needs a longer course at this point because it did not help.  He does have an upcoming appointment on Tuesday at the [facility] and will hopefully get started on long-term maintenance therapy.     Past Medical History:  Diagnosis Date  . Behcet's disease ([place])   . Herpes   . MRSA (methicillin resistant staph aureus) culture positive   . Recurrent mouth ulceration    "since 28 years old"  . Stomatitis and mucositis     Patient Active Problem List   Diagnosis Date Noted  . Behcet's disease ([place]) 09/27/2013  . Stomatitis and mucositis 03/15/2013  . Aphthous ulcer of mouth 03/15/2013  . Hypokalemia 03/12/2013  . Protein-calorie malnutrition, severe ([place]) 03/12/2013  . Acute kidney injury ([place]) 07/08/2012  . Hyperglycemia 07/08/2012  . Cigarette smoker 07/08/2012    Past Surgical History:  Procedure Laterality Date  . BIOPSY PHARYNX          Home Medications    Prior to Admission medications   Medication Sig Start Date End Date Taking? Authorizing Provider  benzonatate (TESSALON) 100 MG capsule Take 1 capsule (100 mg total) by mouth every 8 (eight) hours. 02/27/16   Law, Bea Graff, PA-C  clobetasol ointment (TEMOVATE) 1.32 %  Apply 1 application topically 2 (two) times daily. 06/22/17   Domenic Moras, PA-C  colchicine 0.6 MG tablet Take 1 tablet (0.6 mg total) by mouth 2 (two) times daily. 06/24/18   Lawyer, Harrell Gave, PA-C  docusate sodium (COLACE) 100 MG capsule Take 1 capsule (100 mg total) by mouth daily. 02/27/16   Law, Bea Graff, PA-C  lidocaine (XYLOCAINE) 2 % solution Use as directed 15 mLs in the mouth or throat as needed for mouth pain. 04/23/16   Joy, Shawn C, PA-C  lidocaine (XYLOCAINE) 2 % solution Use as directed 15 mLs in the mouth or throat every 4 (four) hours as needed. 12/07/17   Pisciotta, Elmyra Ricks, PA-C  magic mouthwash SOLN Take 5 mLs by mouth 4 (four) times daily as needed for mouth pain. 02/27/16   Frederica Kuster, PA-C  magic mouthwash w/lidocaine SOLN Take 5 mLs by mouth 3 (three) times daily as needed for mouth pain. Diphenhydramine: Alum & mag hydroxide: Nystatin: Lidocaine 1:1:1:1 03/22/19   Malvin Johns, MD  naproxen (NAPROSYN) 500 MG tablet Take 1 tablet (500 mg total) by mouth 2 (two) times daily. 05/23/16   Antonietta Breach, PA-C  predniSONE (DELTASONE) 20 MG tablet 3 tabs po daily x 3 days, then 2 tabs x 3 days, then 1.5 tabs x 3 days, then 1 tab x 3 days, then 0.5 tabs x 3 days 03/22/19  Rolan BuccoBelfi, Larri Brewton, MD  sucralfate (CARAFATE) 1 GM/10ML suspension Take 10 mLs (1 g total) by mouth 4 (four) times daily -  with meals and at bedtime. 06/22/17   Fayrene Helperran, Bowie, PA-C  triamcinolone cream (KENALOG) 0.1 % Apply 1 application topically 2 (two) times daily. 06/24/18   Charlestine NightLawyer, Filomeno, PA-C    Family History Family History  Problem Relation Age of Onset  . Heart disease Mother   . Lupus Neg Hx   . Crohn's disease Neg Hx     Social History Social History   Tobacco Use  . Smoking status: Current Every Day Smoker    Packs/day: 1.00    Types: Cigarettes  . Smokeless tobacco: Never Used  Substance Use Topics  . Alcohol use: Yes    Comment: occ  . Drug use: No     Allergies   Fluconazole    Review of Systems Review of Systems  Constitutional: Negative for chills, diaphoresis, fatigue and fever.  HENT: Positive for mouth sores. Negative for congestion, rhinorrhea and sneezing.   Eyes: Negative.   Respiratory: Negative for cough, chest tightness and shortness of breath.   Cardiovascular: Negative for chest pain and leg swelling.  Gastrointestinal: Negative for abdominal pain, blood in stool, diarrhea, nausea and vomiting.  Genitourinary: Negative for difficulty urinating, flank pain, frequency and hematuria.  Musculoskeletal: Negative for arthralgias and back pain.  Skin: Negative for rash.  Neurological: Negative for dizziness, speech difficulty, weakness, numbness and headaches.     Physical Exam Updated Vital Signs BP (!) 141/90 (BP Location: Right Arm)   Pulse 95   Temp 98.1 F (36.7 C) (Oral)   Resp 20   Ht 6' (1.829 m)   Wt 72.6 kg   SpO2 100%   BMI 21.70 kg/m   Physical Exam Constitutional:      Appearance: He is well-developed.  HENT:     Head: Normocephalic and atraumatic.     Mouth/Throat:     Comments: Patient has diffuse mucositis of his oropharynx.  There is ulcerations on his lips.  There is some overlying exudative material.  He is tolerating his secretions Eyes:     Pupils: Pupils are equal, round, and reactive to light.  Neck:     Musculoskeletal: Normal range of motion and neck supple.  Cardiovascular:     Rate and Rhythm: Normal rate and regular rhythm.     Heart sounds: Normal heart sounds.  Pulmonary:     Effort: Pulmonary effort is normal. No respiratory distress.     Breath sounds: Normal breath sounds. No wheezing or rales.     Comments: No stridor Chest:     Chest wall: No tenderness.  Abdominal:     General: Bowel sounds are normal.     Palpations: Abdomen is soft.     Tenderness: There is no abdominal tenderness. There is no guarding or rebound.  Musculoskeletal: Normal range of motion.  Lymphadenopathy:     Cervical: No  cervical adenopathy.  Skin:    General: Skin is warm and dry.     Findings: No rash.  Neurological:     Mental Status: He is alert and oriented to person, place, and time.      ED Treatments / Results  Labs (all labs ordered are listed, but only abnormal results are displayed) Labs Reviewed - No data to display  EKG    Radiology No results found.  Procedures Procedures (including critical care time)  Medications Ordered in ED Medications - No  data to display   Initial Impression / Assessment and Plan / ED Course  I have reviewed the triage vital signs and the nursing notes.  Pertinent labs & imaging results that were available during my care of the patient were reviewed by me and considered in my medical decision making (see chart for details).        Patient presents with recurrent stomatitis related to his Behcet's disease.  He states this is consistent with his typical flareup.  He is handling his secretions well and is able to maintain his hydration status.  His vital signs are non-concerning.  He does not appear to be dehydrated.  He was discharged home in good condition.  He does know about the long-term harmful effects of recurrent steroid use.  He is following up with a PCP on Tuesday to hopefully get better control of his disease.  Return precautions were given.  He was started on prednisone and Magic mouthwash.  Final Clinical Impressions(s) / ED Diagnoses   Final diagnoses:  Stomatitis    ED Discharge Orders         Ordered    predniSONE (DELTASONE) 20 MG tablet     03/22/19 1035    magic mouthwash w/lidocaine SOLN  3 times daily PRN     03/22/19 1035           Rolan BuccoBelfi, Terri Malerba, MD 03/22/19 1039

## 2019-03-25 ENCOUNTER — Other Ambulatory Visit: Payer: Self-pay

## 2019-03-25 ENCOUNTER — Ambulatory Visit: Payer: Self-pay | Attending: Nurse Practitioner | Admitting: Nurse Practitioner

## 2019-03-28 ENCOUNTER — Encounter: Payer: Self-pay | Admitting: Internal Medicine

## 2019-03-28 ENCOUNTER — Ambulatory Visit: Payer: Self-pay | Attending: Internal Medicine | Admitting: Internal Medicine

## 2019-03-28 ENCOUNTER — Other Ambulatory Visit: Payer: Self-pay

## 2019-03-28 VITALS — BP 123/81 | HR 81 | Temp 97.7°F | Resp 16 | Ht 72.0 in | Wt 153.4 lb

## 2019-03-28 DIAGNOSIS — M352 Behcet's disease: Secondary | ICD-10-CM

## 2019-03-28 DIAGNOSIS — K121 Other forms of stomatitis: Secondary | ICD-10-CM

## 2019-03-28 DIAGNOSIS — F17200 Nicotine dependence, unspecified, uncomplicated: Secondary | ICD-10-CM

## 2019-03-28 DIAGNOSIS — K1230 Oral mucositis (ulcerative), unspecified: Secondary | ICD-10-CM

## 2019-03-28 MED ORDER — COLCHICINE 0.6 MG PO TABS: 0.6000 mg | ORAL_TABLET | Freq: Every day | ORAL | 1 refills | Status: DC

## 2019-03-28 MED ORDER — TRIAMCINOLONE ACETONIDE 0.1 % EX CREA: TOPICAL_CREAM | CUTANEOUS | 0 refills | Status: DC

## 2019-03-28 NOTE — Progress Notes (Signed)
Patient ID: Paul ReachChristopher Degracia, male    DOB: 03/19/1991  MRN: [id number]  CC: Hospitalization Follow-up (ED)   Subjective: Paul House is a 28 y.o. male who presents for new pt visit and ER f/u for recurrent stomatitis and mucositis due to Behcet's His concerns today include:  Hx of Behcet's, tob dep Last seen here in 2016  Seen in ED several times for flares of stomatitis due to Behcet's Has a flare up about 2 x/mth for past 6 mths.  Usually in mouth; less often in groin area.  Occurred about 2 x this yr in groin.  Also gets intermittent ulcers on skin of arms. -was seeing rheumatologist at [facility], Dr. Sheppard PentonWolf.  Last seen 2 yrs ago.  Had medicaid at the time.  He was on Humira that worked well for him.  Reapplied for Medicaid and was given only family planning Medicaid.   Current oral flare x 3 wks.  Currently on Prednisone taper at 30 mg from ER.  He denies any painful swallowing at this time Was on Colchicine BID in past but he stopped taking due to diarrhea   Tob:  Smoking 1/3 pk a day.  Started at age 28.  Tried to quit in past but only able to last 5 days.  Nicotine patches causes itching and can not chew gum due to recurrent oral ulcers.  He is not ready to quit smoking.  Past medical history, social history, family history and surgical history reviewed.  Patient Active Problem List   Diagnosis Date Noted  . Behcet's disease (HCC) 09/27/2013  . Stomatitis and mucositis 03/15/2013  . Aphthous ulcer of mouth 03/15/2013  . Hypokalemia 03/12/2013  . Protein-calorie malnutrition, severe (HCC) 03/12/2013  . Acute kidney injury (HCC) 07/08/2012  . Hyperglycemia 07/08/2012  . Cigarette smoker 07/08/2012     Current Outpatient Medications on File Prior to Visit  Medication Sig Dispense Refill  . predniSONE (DELTASONE) 20 MG tablet 3 tabs po daily x 3 days, then 2 tabs x 3 days, then 1.5 tabs x 3 days, then 1 tab x 3 days, then 0.5 tabs x 3 days 27 tablet 0  .  sucralfate (CARAFATE) 1 GM/10ML suspension Take 10 mLs (1 g total) by mouth 4 (four) times daily -  with meals and at bedtime. (Patient not taking: Reported on 03/28/2019) 420 mL 0   No current facility-administered medications on file prior to visit.     Allergies  Allergen Reactions  . Fluconazole     Social History   Socioeconomic History  . Marital status: Married    Spouse name: Not on file  . Number of children: 5  . Years of education: Not on file  . Highest education level: Not on file  Occupational History  . Occupation: ware house  Social Needs  . Financial resource strain: Not on file  . Food insecurity    Worry: Not on file    Inability: Not on file  . Transportation needs    Medical: Not on file    Non-medical: Not on file  Tobacco Use  . Smoking status: Current Every Day Smoker    Packs/day: 0.50    Types: Cigarettes  . Smokeless tobacco: Never Used  Substance and Sexual Activity  . Alcohol use: Yes    Comment: occ  . Drug use: No  . Sexual activity: Not on file  Lifestyle  . Physical activity    Days per week: Not on file  Minutes per session: Not on file  . Stress: Not on file  Relationships  . Social Herbalist on phone: Not on file    Gets together: Not on file    Attends religious service: Not on file    Active member of club or organization: Not on file    Attends meetings of clubs or organizations: Not on file    Relationship status: Not on file  . Intimate partner violence    Fear of current or ex partner: Not on file    Emotionally abused: Not on file    Physically abused: Not on file    Forced sexual activity: Not on file  Other Topics Concern  . Not on file  Social History Narrative  . Not on file    Family History  Problem Relation Age of Onset  . Thyroid disease Mother   . Asthma Maternal Grandfather   . Lupus Neg Hx   . Crohn's disease Neg Hx     Past Surgical History:  Procedure Laterality Date  . BIOPSY  PHARYNX      ROS: Review of Systems Negative except as stated above  PHYSICAL EXAM: BP 123/81   Pulse 81   Temp 97.7 F (36.5 C) (Oral)   Resp 16   Ht 6' (1.829 m)   Wt 153 lb 6.4 oz (69.6 kg)   SpO2 97%   BMI 20.80 kg/m   Physical Exam  General appearance - alert, well appearing, young African-American male and in no distress Mental status - normal mood, behavior, speech, dress, motor activity, and thought processes Mouth -patient has severe stomatitis/mucositis of the lips especially the lower lip.  No ulcers seen on the buccal mucosa or pharynx at this time Neck - supple, no significant adenopathy Chest - clear to auscultation, no wheezes, rales or rhonchi, symmetric air entry Heart - normal rate, regular rhythm, normal S1, S2, no murmurs, rubs, clicks or gallops Extremities -no lower extremity edema GU -patient declined examination of this area.  CMP Latest Ref Rng & Units 12/07/2017 09/08/2014 02/28/2014  Glucose 65 - 99 mg/dL 86 77 91  BUN 6 - 20 mg/dL 14 16 9   Creatinine 0.61 - 1.24 mg/dL 0.97 1.03 1.07  Sodium 135 - 145 mmol/L 138 141 140  Potassium 3.5 - 5.1 mmol/L 4.1 4.5 4.3  Chloride 101 - 111 mmol/L 104 102 103  CO2 22 - 32 mmol/L 26 20 27   Calcium 8.9 - 10.3 mg/dL 9.0 9.6 9.3  Total Protein 6.5 - 8.1 g/dL 8.2(H) - 7.3  Total Bilirubin 0.3 - 1.2 mg/dL 0.9 - 0.6  Alkaline Phos 38 - 126 U/L 65 - 67  AST 15 - 41 U/L 24 - 20  ALT 17 - 63 U/L 17 - 19   Lipid Panel  No results found for: CHOL, TRIG, HDL, CHOLHDL, VLDL, LDLCALC, LDLDIRECT  CBC    Component Value Date/Time   WBC 4.3 12/07/2017 1241   RBC 4.53 12/07/2017 1241   HGB 14.5 12/07/2017 1241   HCT 42.2 12/07/2017 1241   PLT 194 12/07/2017 1241   MCV 93.2 12/07/2017 1241   MCH 32.0 12/07/2017 1241   MCHC 34.4 12/07/2017 1241   RDW 11.7 12/07/2017 1241   LYMPHSABS 2.0 12/07/2017 1241   MONOABS 0.7 12/07/2017 1241   EOSABS 0.1 12/07/2017 1241   BASOSABS 0.0 12/07/2017 1241    ASSESSMENT AND  PLAN:  1. Behcet's disease ([place]) We desperately need to get him to a  rheumatologist.  We will explore all avenues.  Advised patient to try applying for Medicaid again.  I will have our caseworker see whether legal aid can assist him.  Also recommended applying for the orange card/cone discount and try to apply for RT care [facility] In the meantime he will continue current prednisone taper.  For prophylaxis we will restart colchicine but have him take it just once a day instead of twice a day.  He will let me know if he has diarrhea with once a day dosing.  I have also given some topical steroid cream to use on the lips -Strongly advised to quit smoking - triamcinolone cream (KENALOG) 0.1 %; Apply to affected area on lips BID for 5 days PRN  Dispense: 30 g; Refill: 0 - colchicine 0.6 MG tablet; Take 1 tablet (0.6 mg total) by mouth daily.  Dispense: 30 tablet; Refill: 1 - Ambulatory referral to Rheumatology  2. Stomatitis and mucositis See #1 above  3. Tobacco dependence Patient advised to quit smoking. Discussed health risks associated with smoking including lung and other types of cancers, chronic lung diseases and CV risks.. Pt not ready to give trail of quitting.     20 minutes spent direct face-to-face interaction with this patient discussing diagnosis, treatment and coordinating care.    Patient was given the opportunity to ask questions.  Patient verbalized understanding of the plan and was able to repeat key elements of the plan.   Orders Placed This Encounter  Procedures  . Ambulatory referral to Rheumatology     Requested Prescriptions   Signed Prescriptions Disp Refills  . triamcinolone cream (KENALOG) 0.1 % 30 g 0    Sig: Apply to affected area on lips BID for 5 days PRN  . colchicine 0.6 MG tablet 30 tablet 1    Sig: Take 1 tablet (0.6 mg total) by mouth daily.    Return in about 7 weeks (around 05/16/2019).  Jonah Blueeborah Talma Aguillard, MD, FACP

## 2019-03-28 NOTE — Patient Instructions (Signed)
I recommend restarting colchicine and take it once daily.  It will help to decrease the frequency of outbreaks.  Use the triamcinolone cream twice a day for 5 days then after that only as needed.  Please apply for the orange card/cone discount card.  You should also try apply for Medicaid.  Once you are approved for any of these programs, please let me know so that we can refer you to the rheumatologist.  The other thing that we will do is try referring you to a rheumatologist at [facility] [place] would have to apply for charity care through [facility] to be seen by her.

## 2019-04-29 ENCOUNTER — Other Ambulatory Visit: Payer: Self-pay

## 2019-04-29 ENCOUNTER — Encounter (HOSPITAL_BASED_OUTPATIENT_CLINIC_OR_DEPARTMENT_OTHER): Payer: Self-pay | Admitting: Emergency Medicine

## 2019-04-29 ENCOUNTER — Emergency Department (HOSPITAL_BASED_OUTPATIENT_CLINIC_OR_DEPARTMENT_OTHER)
Admission: EM | Admit: 2019-04-29 | Discharge: 2019-04-29 | Disposition: A | Discharge status: Home / Self Care | Payer: Medicaid Other | Attending: Emergency Medicine | Admitting: Emergency Medicine

## 2019-04-29 DIAGNOSIS — M79601 Pain in right arm: Secondary | ICD-10-CM

## 2019-04-29 DIAGNOSIS — L98491 Non-pressure chronic ulcer of skin of other sites limited to breakdown of skin: Secondary | ICD-10-CM | POA: Insufficient documentation

## 2019-04-29 DIAGNOSIS — F17210 Nicotine dependence, cigarettes, uncomplicated: Secondary | ICD-10-CM | POA: Insufficient documentation

## 2019-04-29 DIAGNOSIS — Z79899 Other long term (current) drug therapy: Secondary | ICD-10-CM | POA: Insufficient documentation

## 2019-04-29 DIAGNOSIS — M352 Behcet's disease: Secondary | ICD-10-CM | POA: Insufficient documentation

## 2019-04-29 DIAGNOSIS — K121 Other forms of stomatitis: Secondary | ICD-10-CM

## 2019-04-29 MED ORDER — PREDNISONE 20 MG PO TABS: ORAL_TABLET | ORAL | 0 refills | Status: DC

## 2019-04-29 MED ORDER — PREDNISONE 50 MG PO TABS
60.0000 mg | ORAL_TABLET | Freq: Once | ORAL | Status: AC
  Administered 2019-04-29: 60 mg via ORAL
  Filled 2019-04-29: qty 1

## 2019-04-29 NOTE — ED Triage Notes (Signed)
Pt c/o mouth pain. Pt has multiple ulcerations in mouth and on arms.

## 2019-04-29 NOTE — Discharge Instructions (Signed)
Your history and exam today are consistent with recurrence of your ulcers.  After we had a long discussion, we agreed to re-prescribe a prednisone regimen that worked for you in the past.  Please follow-up with your rheumatologist and PCP.  Please continue using your Magic mouthwash at home.  If any symptoms change or worsen or begin to look like an infection, please return to the nearest emergency department.  Please keep your wounds clean and dressed.

## 2019-04-29 NOTE — ED Provider Notes (Signed)
[facility] EMERGENCY DEPARTMENT Provider Note   CSN: [id number] Arrival date & time: 04/29/19  [id number]     History   Chief Complaint Chief Complaint  Patient presents with  . Mouth Lesions    HPI Paul House is a 28 y.o. male.     The history is provided by the patient and medical records. No language interpreter was used.  Mouth Lesions Location:  Upper lip and lower lip Quality:  Red, ulcerous and painful Pain details:    Severity:  Severe   Duration:  3 weeks   Timing:  Constant   Progression:  Unchanged Onset quality:  Gradual Severity:  Severe Duration:  3 weeks Progression:  Waxing and waning Chronicity:  Chronic Context: not a change in diet   Relieved by:  Nothing Associated symptoms: rash   Associated symptoms: no congestion, no fever, no neck pain, no rhinorrhea and no sore throat   Rash:    Location:  Arm   Quality: blistering     Duration:  3 weeks   Timing:  Intermittent   Progression:  Waxing and waning   Past Medical History:  Diagnosis Date  . Behcet's disease (HCC)   . Herpes   . MRSA (methicillin resistant staph aureus) culture positive   . Recurrent mouth ulceration    "since [age] years old"  . Stomatitis and mucositis     Patient Active Problem List   Diagnosis Date Noted  . Behcet's disease (HCC) 09/27/2013  . Stomatitis and mucositis 03/15/2013  . Aphthous ulcer of mouth 03/15/2013  . Acute kidney injury (HCC) 07/08/2012  . Cigarette smoker 07/08/2012    Past Surgical History:  Procedure Laterality Date  . BIOPSY PHARYNX          Home Medications    Prior to Admission medications   Medication Sig Start Date End Date Taking? Authorizing Provider  colchicine 0.6 MG tablet Take 1 tablet (0.6 mg total) by mouth daily. 03/28/19   Marcine MatarJohnson, Deborah B, MD  predniSONE (DELTASONE) 20 MG tablet 3 tabs po daily x 3 days, then 2 tabs x 3 days, then 1.5 tabs x 3 days, then 1 tab x 3 days, then 0.5 tabs x 3 days  03/22/19   Rolan BuccoBelfi, Melanie, MD  sucralfate (CARAFATE) 1 GM/10ML suspension Take 10 mLs (1 g total) by mouth 4 (four) times daily -  with meals and at bedtime. Patient not taking: Reported on 03/28/2019 06/22/17   Fayrene Helperran, Bowie, PA-C  triamcinolone cream (KENALOG) 0.1 % Apply to affected area on lips BID for 5 days PRN 03/28/19   Marcine MatarJohnson, Deborah B, MD    Family History Family History  Problem Relation Age of Onset  . Thyroid disease Mother   . Asthma Maternal Grandfather   . Lupus Neg Hx   . Crohn's disease Neg Hx     Social History Social History   Tobacco Use  . Smoking status: Current Every Day Smoker    Packs/day: 0.50    Types: Cigarettes  . Smokeless tobacco: Never Used  Substance Use Topics  . Alcohol use: Yes    Comment: occ  . Drug use: No     Allergies   Fluconazole   Review of Systems Review of Systems  Constitutional: Negative for chills, diaphoresis, fatigue and fever.  HENT: Positive for mouth sores. Negative for congestion, rhinorrhea, sore throat and trouble swallowing.   Eyes: Negative for pain, redness and visual disturbance.  Respiratory: Negative for cough, chest tightness, shortness of  breath and wheezing.   Cardiovascular: Negative for chest pain and palpitations.  Gastrointestinal: Negative for constipation, diarrhea, nausea and vomiting.  Genitourinary: Negative for dysuria and flank pain.  Musculoskeletal: Negative for back pain and neck pain.  Skin: Positive for rash.  Neurological: Negative for headaches.  Psychiatric/Behavioral: Negative for agitation.  All other systems reviewed and are negative.    Physical Exam Updated Vital Signs BP 116/81 (BP Location: Left Arm)   Pulse 88   Temp 98.7 F (37.1 C) (Tympanic)   Resp 16   SpO2 100%   Physical Exam Vitals signs and nursing note reviewed.  Constitutional:      General: He is not in acute distress.    Appearance: He is well-developed. He is not ill-appearing, toxic-appearing or  diaphoretic.  HENT:     Head: Normocephalic and atraumatic.     Jaw: No trismus, swelling or malocclusion.     Comments: Patient has ulcers in his mouth that are painful.  Also evidence of thrush.  Normal swallow and speech.  No difficulty breathing or stridor on exam.    Nose: No congestion or rhinorrhea.     Mouth/Throat:     Pharynx: No oropharyngeal exudate.  Eyes:     Extraocular Movements: Extraocular movements intact.     Conjunctiva/sclera: Conjunctivae normal.     Pupils: Pupils are equal, round, and reactive to light.  Neck:     Musculoskeletal: Neck supple. No muscular tenderness.  Cardiovascular:     Rate and Rhythm: Normal rate and regular rhythm.     Heart sounds: No murmur.  Pulmonary:     Effort: Pulmonary effort is normal. No respiratory distress.     Breath sounds: Normal breath sounds. No wheezing, rhonchi or rales.  Chest:     Chest wall: No tenderness.  Abdominal:     Palpations: Abdomen is soft.     Tenderness: There is no abdominal tenderness. There is no right CVA tenderness or left CVA tenderness.  Musculoskeletal:        General: Tenderness present.  Skin:    General: Skin is warm and dry.     Capillary Refill: Capillary refill takes less than 2 seconds.     Findings: Lesion and rash present.     Comments: Patient has numerous ulcers on both of his arms that were bandaged.  Bandages were taken down and examined.  No evidence of acute infection but he does have painful blisters and ulcers on his arms.  Symmetric grip strength, sensation, and pulses.  No evidence of acute infection on my examination.  Neurological:     General: No focal deficit present.     Mental Status: He is alert and oriented to person, place, and time.      ED Treatments / Results  Labs (all labs ordered are listed, but only abnormal results are displayed) Labs Reviewed - No data to display  EKG None  Radiology No results found.  Procedures Procedures (including critical  care time)  Medications Ordered in ED Medications  predniSONE (DELTASONE) tablet 60 mg (60 mg Oral Given 04/29/19 0744)     Initial Impression / Assessment and Plan / ED Course  I have reviewed the triage vital signs and the nursing notes.  Pertinent labs & imaging results that were available during my care of the patient were reviewed by me and considered in my medical decision making (see chart for details).        Paul ReachChristopher Campanaro is a 28 y.o.  male with a past medical history significant for Behcet's disease with recurrent stomatitis, mucositis, and skin lesions who presents with recurrent pain in his mouth and arms.  He reports that he has been on and off steroids for "a long time" and chart you shows that he recently completed steroid several weeks ago.  He reports for last 3 weeks his ulcers in his mouth and arms have worsened again and he would like to be started back on steroids.  He reports that he knows he needs to see a rheumatologist and has been referred to one at [facility] by his PCP recently.  He reports he is on his colchicine but he thinks he need steroids again as the colchicine is not helping by itself.  He reports his pain is moderate.  He is still able to eat and drink and denies any visual complaints.  He denies any nausea, vomiting, chest pain, shortness breath, fevers, chills, or any GU symptoms.  Denies other complaints.  On exam, patient is multiple sores in his mouth as well as what appears to be thrush on his tongue.  Chart shows that he has had this frequently.  Patient has multiple ulcers on both of his arms however there is minimal surrounding erythema, no crepitance, and no foul smell.  Patient does not feel that his arms are infected and need antibiotics at this time.  Patient is good grip strength and good pulses.  Normal sensation and strength in lower extremities.  Abdomen nontender.  Lungs clear, chest nontender.  Exam otherwise unremarkable.  Eyes had  normal movement and had symmetric pupils.  No evidence of abnormality seen on his eyes.  No complaints of visual changes.  Suspect recurrent Behcet's disease manifestations in his mouth and his arms as he has before.  He reports it is very similar and he simply would like to be started back on steroids.  Patient was advised that he does need to see his rheumatologist to get a better plan as there are other medications which could benefit him more but are not handled here in the emergency department.  As we agreed that the patient's arms do not appear acutely infected, will hold on antibiotics however he was given extremely strict return precautions for any signs or symptoms developing infection.  His wounds on his arms will be covered with bacitracin and re-bandaged cleanly.  He will be started back on the steroid regimen which he reports is worked the best which was a 3-week taper of 60, 40, and 20 in 1 week doses respectively.  He was not interested in the stairstepping taper for a burst.  He reports that he does not want more Magic mouthwash for the likely thrush.  Patient was given a dose of steroids here in the emergency department and was discharged with understanding of plan of care.   Final Clinical Impressions(s) / ED Diagnoses   Final diagnoses:  Behcet recurrent disease (HCC)  Mouth ulcers  Pain in both upper extremities    ED Discharge Orders         Ordered    predniSONE (DELTASONE) 20 MG tablet     04/29/19 0825          Clinical Impression: 1. Behcet recurrent disease (HCC)   2. Mouth ulcers   3. Pain in both upper extremities     Disposition: Discharge  Condition: Good  I have discussed the results, Dx and Tx plan with the pt(& family if present). He/she/they expressed  understanding and agree(s) with the plan. Discharge instructions discussed at great length. Strict return precautions discussed and pt &/or family have verbalized understanding of the instructions.  No further questions at time of discharge.    New Prescriptions   PREDNISONE (DELTASONE) 20 MG TABLET    Please take 3 tabs (60mg ) x 7 days, 2 tabs (40mg ) x 7 days, and 1 tab (20mg ) x 7 days.    Follow Up: Ladell Pier, MD [place] [address] [phone]     [facility] DEPARTMENT [address] [id number] VV ZS[place] [phone]       Tyshae Stair, Gwenyth Allegra, MD 04/29/19 [phone]

## 2019-04-29 NOTE — ED Notes (Signed)
Extensive wound care provided, all open areas to arms covered with gauze 4x4's and wrapped with kerlix per pt request.

## 2019-05-20 ENCOUNTER — Ambulatory Visit: Payer: Medicaid Other | Admitting: Internal Medicine

## 2019-05-22 ENCOUNTER — Ambulatory Visit: Payer: Medicaid Other | Admitting: Internal Medicine

## 2019-06-09 ENCOUNTER — Encounter: Payer: Self-pay | Admitting: Internal Medicine

## 2019-06-09 ENCOUNTER — Ambulatory Visit: Payer: Self-pay | Attending: Internal Medicine | Admitting: Internal Medicine

## 2019-06-09 ENCOUNTER — Other Ambulatory Visit: Payer: Self-pay

## 2019-06-09 DIAGNOSIS — M352 Behcet's disease: Secondary | ICD-10-CM

## 2019-06-09 MED ORDER — PREDNISONE 20 MG PO TABS: ORAL_TABLET | ORAL | 0 refills | Status: DC

## 2019-06-09 MED ORDER — COLCHICINE 0.6 MG PO TABS: 0.6000 mg | ORAL_TABLET | Freq: Every day | ORAL | 1 refills | Status: AC

## 2019-06-09 NOTE — Progress Notes (Signed)
Virtual Visit via Telephone Note Due to current restrictions/limitations of in-office visits due to the COVID-19 pandemic, this scheduled clinical appointment was converted to a telehealth visit  I connected with Paul House on 06/09/19 at 4:23 p.m by telephone and verified that I am speaking with the correct person using two identifiers. I am in my office.  The patient is at home.  Only the patient and myself participated in this encounter.  I discussed the limitations, risks, security and privacy concerns of performing an evaluation and management service by telephone and the availability of in person appointments. I also discussed with the patient that there may be a patient responsible charge related to this service. The patient expressed understanding and agreed to proceed.   History of Present Illness: Patient with history of Behcet's disease and tob dep.  Last seen 03/2019.    -Since I last saw him in June, he had had flare of Behcet's in July requiring ER visit. Also had a flare earlier this mth. Takes Colchicine 1-2 x a wk instead of daily as was prescribed on last visit for prophylaxis. He forgets to take it every day.  I recommended that he apply for the orange card/cone discount card and also for charity care at [facility] so that we can try to get him to a rheumatologist. He has completed the form for [facility] discount but he has questions about supporting documents.  He was mailed charity form for [facility] but his address has changed and he did not get the forms. He is requesting refill on colchicine and prednisone.   Observations/Objective: No direct observation done as this was a telephone encounter  Assessment and Plan: 1. Behcet's disease ([place]) Advised patient to take the colchicine every day as prescribed.  Refill given on prednisone. I have put him through to my CMA so that he can update his mailing address in our system.  She would also be able to answer some  of the questions that he has regarding supporting documents for the orange card/cone discount. - colchicine 0.6 MG tablet; Take 1 tablet (0.6 mg total) by mouth daily.  Dispense: 30 tablet; Refill: 1 - predniSONE (DELTASONE) 20 MG tablet; Please take 3 tabs (60mg ) x 4 days, 2 tabs (40mg ) x 4 days, 1 tab (20mg ) x 4 days then 1/2 tab x 4 days.  Dispense: 26 tablet; Refill: 0  Follow Up Instructions: Follow-up in 4 months   I discussed the assessment and treatment plan with the patient. The patient was provided an opportunity to ask questions and all were answered. The patient agreed with the plan and demonstrated an understanding of the instructions.   The patient was advised to call back or seek an in-person evaluation if the symptoms worsen or if the condition fails to improve as anticipated.  I provided 9 minutes of non-face-to-face time during this encounter.   Karle Plumber, MD

## 2019-06-11 ENCOUNTER — Other Ambulatory Visit: Payer: Self-pay | Admitting: Internal Medicine

## 2019-06-11 DIAGNOSIS — M352 Behcet's disease: Secondary | ICD-10-CM

## 2019-06-11 MED ORDER — PREDNISONE 20 MG PO TABS: ORAL_TABLET | ORAL | 0 refills | Status: DC

## 2019-06-20 ENCOUNTER — Telehealth: Payer: Self-pay | Admitting: Internal Medicine

## 2019-06-20 NOTE — Telephone Encounter (Signed)
Attempted to leave VM to schedule appt, VM was not set up.

## 2019-06-20 NOTE — Telephone Encounter (Signed)
-----   Message from Jackelyn Knife, [place] sent at 06/09/2019  4:33 PM EDT ----- Please contact pt and schedule a 4 month f/u

## 2019-07-05 ENCOUNTER — Other Ambulatory Visit: Payer: Self-pay

## 2019-07-05 ENCOUNTER — Emergency Department (HOSPITAL_BASED_OUTPATIENT_CLINIC_OR_DEPARTMENT_OTHER)
Admission: EM | Admit: 2019-07-05 | Discharge: 2019-07-05 | Disposition: A | Discharge status: Home / Self Care | Payer: Medicaid Other | Attending: Emergency Medicine | Admitting: Emergency Medicine

## 2019-07-05 ENCOUNTER — Encounter (HOSPITAL_BASED_OUTPATIENT_CLINIC_OR_DEPARTMENT_OTHER): Payer: Self-pay

## 2019-07-05 DIAGNOSIS — K1379 Other lesions of oral mucosa: Secondary | ICD-10-CM | POA: Insufficient documentation

## 2019-07-05 DIAGNOSIS — Z5321 Procedure and treatment not carried out due to patient leaving prior to being seen by health care provider: Secondary | ICD-10-CM | POA: Insufficient documentation

## 2019-07-05 NOTE — ED Notes (Signed)
Came up to desk requesting to leave.  States I'll come back tomorrow.

## 2019-07-05 NOTE — ED Triage Notes (Signed)
Pt c/o painful mouth ulcers.

## 2020-01-07 ENCOUNTER — Encounter (HOSPITAL_BASED_OUTPATIENT_CLINIC_OR_DEPARTMENT_OTHER): Payer: Self-pay | Admitting: Emergency Medicine

## 2020-01-07 ENCOUNTER — Other Ambulatory Visit: Payer: Self-pay

## 2020-01-07 ENCOUNTER — Emergency Department (HOSPITAL_BASED_OUTPATIENT_CLINIC_OR_DEPARTMENT_OTHER)
Admission: EM | Admit: 2020-01-07 | Discharge: 2020-01-07 | Disposition: A | Discharge status: Home / Self Care | Payer: Medicaid Other | Attending: Emergency Medicine | Admitting: Emergency Medicine

## 2020-01-07 DIAGNOSIS — Z79899 Other long term (current) drug therapy: Secondary | ICD-10-CM | POA: Insufficient documentation

## 2020-01-07 DIAGNOSIS — F17210 Nicotine dependence, cigarettes, uncomplicated: Secondary | ICD-10-CM | POA: Insufficient documentation

## 2020-01-07 DIAGNOSIS — M352 Behcet's disease: Secondary | ICD-10-CM | POA: Insufficient documentation

## 2020-01-07 MED ORDER — PREDNISONE 20 MG PO TABS: 40.0000 mg | ORAL_TABLET | Freq: Every day | ORAL | 0 refills | Status: AC

## 2020-01-07 MED FILL — predniSONE 20 MG TABS: 20 | 5 days supply | Qty: 10 | Fill #0

## 2020-01-07 NOTE — ED Triage Notes (Signed)
Pt here with Behcet's flare up. States this episode has been ongoing for several weeks.

## 2020-01-07 NOTE — Discharge Instructions (Addendum)
I have prescribed steroids to help clear your acute flareup.  Please take 2 tablets daily for the next 5 days.  Please be aware steroids can cause insomnia, appetite changes, and flushness.  Please go to your appointment with your primary care physician on April 7.

## 2020-01-07 NOTE — ED Provider Notes (Signed)
[facility] EMERGENCY DEPARTMENT Provider Note   CSN: [id number] Arrival date & time: 01/07/20  1353     History Chief Complaint  Patient presents with  . Mouth Lesions    Paul House is a 29 y.o. male.  29 y.o male with a PMH of Behcet's disease presents to the ED with a chief complaint of mouth sores x several weeks. Patient reports ulcer have been present for a several weeks but worsen today. He also reports being evaluated at [facility] this morning and given a prescription for colchicine but reports medication does not work for him. He does have an appointment scheduled with a new PCP on April 7th. No fevers, no difficulty tolerating his secretions or other complaints.   The history is provided by the patient.       Past Medical History:  Diagnosis Date  . Behcet's disease (HCC)   . Herpes   . MRSA (methicillin resistant staph aureus) culture positive   . Recurrent mouth ulceration    "since 29 years old"  . Stomatitis and mucositis     Patient Active Problem List   Diagnosis Date Noted  . Behcet's disease (HCC) 09/27/2013  . Stomatitis and mucositis 03/15/2013  . Aphthous ulcer of mouth 03/15/2013  . Acute kidney injury (HCC) 07/08/2012  . Cigarette smoker 07/08/2012    Past Surgical History:  Procedure Laterality Date  . BIOPSY PHARYNX         Family History  Problem Relation Age of Onset  . Thyroid disease Mother   . Asthma Maternal Grandfather   . Lupus Neg Hx   . Crohn's disease Neg Hx     Social History   Tobacco Use  . Smoking status: Current Every Day Smoker    Packs/day: 0.50    Types: Cigarettes  . Smokeless tobacco: Never Used  Substance Use Topics  . Alcohol use: Yes    Comment: occ  . Drug use: No    Home Medications Prior to Admission medications   Medication Sig Start Date End Date Taking? Authorizing Provider  colchicine 0.6 MG tablet Take 1 tablet (0.6 mg total) by mouth daily. 06/09/19   Marcine Matar,  MD  predniSONE (DELTASONE) 20 MG tablet Take 2 tablets (40 mg total) by mouth daily for 5 days. 01/07/20 01/12/20  Claude Manges, PA-C    Allergies    Fluconazole  Review of Systems   Review of Systems  Constitutional: Negative for fever.  HENT: Positive for mouth sores. Negative for dental problem, drooling, facial swelling and trouble swallowing.     Physical Exam Updated Vital Signs BP (!) 144/88   Pulse (!) 108   Temp 98 F (36.7 C) (Oral)   Resp 18   Ht 6' (1.829 m)   Wt 68 kg   SpO2 99%   BMI 20.34 kg/m   Physical Exam Vitals and nursing note reviewed.  Constitutional:      Appearance: Normal appearance.  HENT:     Head: Normocephalic and atraumatic.     Nose: Nose normal.     Mouth/Throat:     Mouth: Mucous membranes are moist. Oral lesions present. No lacerations or angioedema.     Dentition: No dental tenderness or dental caries.     Tongue: Lesions present.     Comments: Multiple oral lesions noted to inner lips and tongue.No dental abscess noted, no trouble tolerating secretions.  Eyes:     Pupils: Pupils are equal, round, and reactive to light.  Cardiovascular:     Rate and Rhythm: Normal rate.  Pulmonary:     Effort: Pulmonary effort is normal.     Breath sounds: No wheezing or rales.  Abdominal:     General: Abdomen is flat.  Musculoskeletal:     Cervical back: Normal range of motion and neck supple.  Skin:    General: Skin is warm and dry.  Neurological:     Mental Status: He is alert and oriented to person, place, and time.     ED Results / Procedures / Treatments   Labs (all labs ordered are listed, but only abnormal results are displayed) Labs Reviewed - No data to display  EKG None  Radiology No results found.  Procedures Procedures (including critical care time)  Medications Ordered in ED Medications - No data to display  ED Course  I have reviewed the triage vital signs and the nursing notes.  Pertinent labs & imaging  results that were available during my care of the patient were reviewed by me and considered in my medical decision making (see chart for details).    MDM Rules/Calculators/A&P   Patient with a past medical history of Behcets presents to the ED with a chief complaint of multiple oral lesions for the past several weeks.  Reports there has been worsening the past week.  Seen at [facility] today, given a prescription for colchicine?  According to patient he is unable to fill this prescription and medication does not report improvement in his symptoms.  He has been previously treated with steroids, is requesting this therapy on today's visit.  During evaluation there are multiple lesions noted to the inner lip, tongue laterally.  He is tolerating his secretions.  There is no fever, drooling, dental abscess noted.  Patient was given a short prescription for steroids to help with his symptoms.  Advised not to combine the therapy of the colchicine and the steroids.  Return precautions were discussed at length.   Portions of this note were generated with Lobby[employer]. Dictation errors may occur despite best attempts at proofreading.  Final Clinical Impression(s) / ED Diagnoses Final diagnoses:  Behcet's disease ([place])    Rx / DC Orders ED Discharge Orders         Ordered    predniSONE (DELTASONE) 20 MG tablet  Daily     01/07/20 1452           Janeece Fitting, PA-C 01/07/20 1500    Blanchie Dessert, MD 01/07/20 [phone]

## 2020-01-20 ENCOUNTER — Other Ambulatory Visit: Payer: Self-pay

## 2020-01-20 ENCOUNTER — Emergency Department (HOSPITAL_BASED_OUTPATIENT_CLINIC_OR_DEPARTMENT_OTHER)
Admission: EM | Admit: 2020-01-20 | Discharge: 2020-01-20 | Disposition: A | Discharge status: Home / Self Care | Payer: Self-pay | Attending: Emergency Medicine | Admitting: Emergency Medicine

## 2020-01-20 ENCOUNTER — Encounter (HOSPITAL_BASED_OUTPATIENT_CLINIC_OR_DEPARTMENT_OTHER): Payer: Self-pay | Admitting: *Deleted

## 2020-01-20 DIAGNOSIS — M352 Behcet's disease: Secondary | ICD-10-CM | POA: Insufficient documentation

## 2020-01-20 DIAGNOSIS — N5089 Other specified disorders of the male genital organs: Secondary | ICD-10-CM | POA: Insufficient documentation

## 2020-01-20 DIAGNOSIS — Z79899 Other long term (current) drug therapy: Secondary | ICD-10-CM | POA: Insufficient documentation

## 2020-01-20 DIAGNOSIS — R21 Rash and other nonspecific skin eruption: Secondary | ICD-10-CM | POA: Insufficient documentation

## 2020-01-20 DIAGNOSIS — F17210 Nicotine dependence, cigarettes, uncomplicated: Secondary | ICD-10-CM | POA: Insufficient documentation

## 2020-01-20 MED ORDER — PREDNISONE 10 MG (21) PO TBPK: ORAL_TABLET | Freq: Every day | ORAL | 0 refills | Status: DC

## 2020-01-20 MED ORDER — HYDROCORTISONE 1 % EX CREA: TOPICAL_CREAM | CUTANEOUS | 0 refills | Status: DC

## 2020-01-20 MED ORDER — HYDROCORTISONE 1 % EX CREA: TOPICAL_CREAM | CUTANEOUS | 0 refills | Status: AC

## 2020-01-20 MED FILL — HYDROCORTISONE 1% CREAM: 1 | 10 days supply | Qty: 28 | Fill #0

## 2020-01-20 MED FILL — predniSONE 10 MG TABS: 10 | 12 days supply | Qty: 42 | Fill #0

## 2020-01-20 NOTE — ED Provider Notes (Signed)
[facility] EMERGENCY DEPARTMENT Provider Note   CSN: [id number] Arrival date & time: 01/20/20  1446     History Chief Complaint  Patient presents with  . Mouth Injury    Paul House is a 29 y.o. male with PMH significant for Behcet's disease who presents to the ED with complaints of aphthous and genital ulcers.  Patient reports that he has been experiencing these ulcerative manifestations for his entire life and that today's symptoms are consistent with his prior flares.  He recently became established with [facility] in [place], [place] on 01/14/2020.  He was subsequently referred to rheumatology and they attempted to contact him while he was in the waiting room.  However, there is a $100 co-pay in order to be evaluated that he informed him he would be unable to afford.  He denies any recent illness, fevers or chills, penile discharge, testicular pain or swelling, difficulty swallowing or breathing, sore throat, tongue swelling, or other symptoms.  He had been evaluated at the end of March and prescribed a prednisone burst, however he states that he typically sees better improvement with a prednisone taper.  HPI     Past Medical History:  Diagnosis Date  . Behcet's disease (HCC)   . Herpes   . MRSA (methicillin resistant staph aureus) culture positive   . Recurrent mouth ulceration    "since 29 years old"  . Stomatitis and mucositis     Patient Active Problem List   Diagnosis Date Noted  . Behcet's disease (HCC) 09/27/2013  . Stomatitis and mucositis 03/15/2013  . Aphthous ulcer of mouth 03/15/2013  . Acute kidney injury (HCC) 07/08/2012  . Cigarette smoker 07/08/2012    Past Surgical History:  Procedure Laterality Date  . BIOPSY PHARYNX         Family History  Problem Relation Age of Onset  . Thyroid disease Mother   . Asthma Maternal Grandfather   . Lupus Neg Hx   . Crohn's disease Neg Hx     Social History    Tobacco Use  . Smoking status: Current Every Day Smoker    Packs/day: 0.50    Types: Cigarettes  . Smokeless tobacco: Never Used  Substance Use Topics  . Alcohol use: Yes    Comment: occ  . Drug use: No    Home Medications Prior to Admission medications   Medication Sig Start Date End Date Taking? Authorizing Provider  colchicine 0.6 MG tablet Take 1 tablet (0.6 mg total) by mouth daily. 06/09/19   Marcine Matar, MD  hydrocortisone cream 1 % Apply to affected area 2 times daily 01/20/20   Lorelee New, PA-C  predniSONE (STERAPRED UNI-PAK 21 TAB) 10 MG (21) TBPK tablet Take by mouth daily. Take 6 tabs by mouth daily  for 2 days, then 5 tabs for 2 days, then 4 tabs for 2 days, then 3 tabs for 2 days, 2 tabs for 2 days, then 1 tab by mouth daily for 2 days 01/20/20   Lorelee New, PA-C    Allergies    Fluconazole  Review of Systems   Review of Systems  Constitutional: Negative for chills and fever.  Skin: Positive for rash and wound.  Neurological: Negative for weakness and numbness.    Physical Exam Updated Vital Signs BP (!) 122/91   Pulse 93   Temp 98.8 F (37.1 C) (Oral)   Resp 18   Ht 6' (1.829 m)   Wt 68  kg   SpO2 98%   BMI 20.33 kg/m   Physical Exam Vitals and nursing note reviewed. Exam conducted with a chaperone present.  Constitutional:      General: He is not in acute distress.    Appearance: Normal appearance. He is not ill-appearing.  HENT:     Head: Normocephalic and atraumatic.     Mouth/Throat:     Comments: Scattered nonbleeding ulcerative lesions throughout.  Uvula midline.  Oropharynx is patent and there is no tonsillar hypertrophy or exudates.  No tongue swelling or floor of mouth induration.  Tolerating secretions well.  No trismus.   Eyes:     General: No scleral icterus.    Conjunctiva/sclera: Conjunctivae normal.  Cardiovascular:     Rate and Rhythm: Normal rate and regular rhythm.     Pulses: Normal pulses.     Heart sounds:  Normal heart sounds.  Pulmonary:     Effort: Pulmonary effort is normal. No respiratory distress.     Breath sounds: Normal breath sounds.  Musculoskeletal:     Cervical back: Normal range of motion and neck supple. No rigidity.  Skin:    General: Skin is dry.     Capillary Refill: Capillary refill takes less than 2 seconds.  Neurological:     Mental Status: He is alert and oriented to person, place, and time.     GCS: GCS eye subscore is 4. GCS verbal subscore is 5. GCS motor subscore is 6.  Psychiatric:        Mood and Affect: Mood normal.        Behavior: Behavior normal.        Thought Content: Thought content normal.      ED Results / Procedures / Treatments   Labs (all labs ordered are listed, but only abnormal results are displayed) Labs Reviewed - No data to display  EKG None  Radiology No results found.  Procedures Procedures (including critical care time)  Medications Ordered in ED Medications - No data to display  ED Course  I have reviewed the triage vital signs and the nursing notes.  Pertinent labs & imaging results that were available during my care of the patient were reviewed by me and considered in my medical decision making (see chart for details).    MDM Rules/Calculators/A&P                      Patient informs me that he has been positively worked up for Behcet's disease including biopsy years ago and chronically has endured these flares.  His oropharyngeal exam is entirely reassuring.  He was just evaluated by his new primary care provider one week ago and was encouraged to follow-up with rheumatology, however he was experiencing financial constraints.  I emphasized the importance of following up with rheumatology for ongoing evaluation and management.    Will treat with prednisone taper as he reports that it has worked better for him than prednisone burst.  We will also provide patient with a topical steroid for local relief on the head of his  penis and other areas of ulceration.    Also encouraging patient to notify his primary care provider of today's encounter.  They may be able to better assist him in his efforts to get established with rheumatology.  Strict ED return precautions discussed.  Patient voices understanding and is agreeable to the plan.  Final Clinical Impression(s) / ED Diagnoses Final diagnoses:  Behcet's syndrome involving oral mucosa ([place])  Rx / DC Orders ED Discharge Orders         Ordered    predniSONE (STERAPRED UNI-PAK 21 TAB) 10 MG (21) TBPK tablet  Daily     01/20/20 1703    hydrocortisone cream 1 %     01/20/20 1703           Lorelee New, PA-C 01/20/20 1703    Milagros Loll, MD 01/21/20 2356

## 2020-01-20 NOTE — Discharge Instructions (Signed)
Please follow-up with your primary care provider regarding today's encounter.  You would most benefit from rheumatology follow-up.  Please call them back to try and schedule appointment for ongoing evaluation and management.  Please take the prednisone Dosepak, as prescribed.  I have also prescribed you a topical steroid for local relief.  Return to the ED or seek immediate medical attention should you experience any new or worsening symptoms.

## 2020-01-20 NOTE — ED Triage Notes (Signed)
Pt c/o  mouth sores x 2 days

## 2020-04-10 ENCOUNTER — Other Ambulatory Visit: Payer: Self-pay

## 2020-04-10 ENCOUNTER — Encounter (HOSPITAL_BASED_OUTPATIENT_CLINIC_OR_DEPARTMENT_OTHER): Payer: Self-pay

## 2020-04-10 ENCOUNTER — Emergency Department (HOSPITAL_BASED_OUTPATIENT_CLINIC_OR_DEPARTMENT_OTHER)
Admission: EM | Admit: 2020-04-10 | Discharge: 2020-04-10 | Disposition: A | Discharge status: Home / Self Care | Payer: Medicaid Other | Attending: Emergency Medicine | Admitting: Emergency Medicine

## 2020-04-10 DIAGNOSIS — K120 Recurrent oral aphthae: Secondary | ICD-10-CM | POA: Insufficient documentation

## 2020-04-10 DIAGNOSIS — M352 Behcet's disease: Secondary | ICD-10-CM

## 2020-04-10 DIAGNOSIS — F17210 Nicotine dependence, cigarettes, uncomplicated: Secondary | ICD-10-CM | POA: Diagnosis not present

## 2020-04-10 DIAGNOSIS — K1370 Unspecified lesions of oral mucosa: Secondary | ICD-10-CM | POA: Diagnosis present

## 2020-04-10 MED ORDER — MAGIC MOUTHWASH
5.0000 mL | Freq: Once | ORAL | Status: DC
  Filled 2020-04-10: qty 5

## 2020-04-10 MED ORDER — SUCRALFATE 1 GM/10ML PO SUSP: 1.0000 g | Freq: Four times a day (QID) | ORAL | 0 refills | Status: AC | PRN

## 2020-04-10 MED ORDER — LIDOCAINE VISCOUS HCL 2 % MT SOLN
15.0000 mL | Freq: Once | OROMUCOSAL | Status: AC
  Administered 2020-04-10: 15 mL via OROMUCOSAL
  Filled 2020-04-10: qty 15

## 2020-04-10 MED ORDER — PREDNISONE 5 MG PO TABS: ORAL_TABLET | ORAL | 0 refills | Status: DC

## 2020-04-10 MED ORDER — PREDNISONE 20 MG PO TABS
40.0000 mg | ORAL_TABLET | Freq: Once | ORAL | Status: AC
  Administered 2020-04-10: 40 mg via ORAL
  Filled 2020-04-10: qty 2

## 2020-04-10 NOTE — ED Triage Notes (Signed)
Pt with Behcet syndrome with lesions mouth and eyebrow and arm.  No bleeding, painful.

## 2020-04-11 NOTE — ED Provider Notes (Signed)
[facility] EMERGENCY DEPARTMENT Provider Note   CSN: [id number] Arrival date & time: 04/10/20  0909     History Chief Complaint  Patient presents with  . Mouth Lesions    Paul House is a 29 y.o. male.  HPI     29 year old male with history of Behcet's Disease presents with concern for oral lesions.  Reports that they began approximately 2 days ago.  Reports ulcers within the mouth, as well as a lesion above his left eye.  Reports these are typical locations for flares of his disease.  He has not tried any topical steroids at this point, reports that usually he receives oral steroids.  Denies fevers, arthritis, or other concerns. Denies ano/genital lesions. Eating and drinking normally.   Past Medical History:  Diagnosis Date  . Behcet's disease (HCC)   . Herpes   . MRSA (methicillin resistant staph aureus) culture positive   . Recurrent mouth ulceration    "since 29 years old"  . Stomatitis and mucositis     Patient Active Problem List   Diagnosis Date Noted  . Behcet's disease (HCC) 09/27/2013  . Stomatitis and mucositis 03/15/2013  . Aphthous ulcer of mouth 03/15/2013  . Acute kidney injury (HCC) 07/08/2012  . Cigarette smoker 07/08/2012    Past Surgical History:  Procedure Laterality Date  . BIOPSY PHARYNX         Family History  Problem Relation Age of Onset  . Thyroid disease Mother   . Asthma Maternal Grandfather   . Lupus Neg Hx   . Crohn's disease Neg Hx     Social History   Tobacco Use  . Smoking status: Current Every Day Smoker    Packs/day: 0.50    Types: Cigarettes  . Smokeless tobacco: Never Used  Vaping Use  . Vaping Use: Never used  Substance Use Topics  . Alcohol use: Yes    Comment: occ  . Drug use: No    Home Medications Prior to Admission medications   Medication Sig Start Date End Date Taking? Authorizing Provider  colchicine 0.6 MG tablet Take 1 tablet (0.6 mg total) by mouth daily. 06/09/19   Marcine Matar, MD  hydrocortisone cream 1 % Apply to affected area 2 times daily 01/20/20   Lorelee New, PA-C  predniSONE (DELTASONE) 5 MG tablet Take three tablets a day for one week followed by two tablets a day for one week. 04/10/20   Alvira Monday, MD  sucralfate (CARAFATE) 1 GM/10ML suspension Take 10 mLs (1 g total) by mouth 4 (four) times daily as needed. 04/10/20   Alvira Monday, MD    Allergies    Fluconazole  Review of Systems   Review of Systems  Constitutional: Negative for fever.  HENT: Positive for mouth sores.   Respiratory: Negative for shortness of breath.   Cardiovascular: Negative for chest pain.  Gastrointestinal: Negative for nausea and vomiting.  Genitourinary: Negative for penile pain.  Musculoskeletal: Negative for arthralgias.    Physical Exam Updated Vital Signs BP 111/71 (BP Location: Right Arm)   Pulse 84   Temp 98.1 F (36.7 C) (Oral)   Resp 16   SpO2 99%   Physical Exam Vitals and nursing note reviewed.  Constitutional:      General: He is not in acute distress.    Appearance: Normal appearance. He is not ill-appearing, toxic-appearing or diaphoretic.  HENT:     Head: Normocephalic.     Comments: apthous ulcers throughout mouth Ulcer left  eyebrow, no surrounding erythema or swelling Eyes:     Conjunctiva/sclera: Conjunctivae normal.  Cardiovascular:     Rate and Rhythm: Normal rate and regular rhythm.     Pulses: Normal pulses.  Pulmonary:     Effort: Pulmonary effort is normal. No respiratory distress.  Musculoskeletal:        General: No deformity or signs of injury.     Cervical back: No rigidity.  Skin:    General: Skin is warm and dry.     Coloration: Skin is not jaundiced or pale.  Neurological:     General: No focal deficit present.     Mental Status: He is alert and oriented to person, place, and time.     ED Results / Procedures / Treatments   Labs (all labs ordered are listed, but only abnormal results are  displayed) Labs Reviewed - No data to display  EKG None  Radiology No results found.  Procedures Procedures (including critical care time)  Medications Ordered in ED Medications  predniSONE (DELTASONE) tablet 40 mg (40 mg Oral Given 04/10/20 1015)  lidocaine (XYLOCAINE) 2 % viscous mouth solution 15 mL (15 mLs Mouth/Throat Given 04/10/20 1019)    ED Course  I have reviewed the triage vital signs and the nursing notes.  Pertinent labs & imaging results that were available during my care of the patient were reviewed by me and considered in my medical decision making (see chart for details).    MDM Rules/Calculators/A&P                          29 year old male with history of Behcet's Disease presents with concern for oral lesions.  Mucocutaneous lesions consistent with known hx of Behcet's. No sign of bacterial infection.  Given rx for prednisone 15mg  x1wk and 10mg  for 1wk as well as sucralfate. Patient discharged in stable condition with understanding of reasons to return.   Final Clinical Impression(s) / ED Diagnoses Final diagnoses:  Behcet recurrent disease (HCC)  Recurrent oral aphthae    Rx / DC Orders ED Discharge Orders         Ordered    sucralfate (CARAFATE) 1 GM/10ML suspension  4 times daily PRN     Discontinue  Reprint     04/10/20 1013    predniSONE (DELTASONE) 5 MG tablet     Discontinue  Reprint     04/10/20 1013           06/11/20, MD 04/11/20 1118

## 2020-05-05 ENCOUNTER — Other Ambulatory Visit: Payer: Self-pay

## 2020-05-05 ENCOUNTER — Emergency Department (HOSPITAL_BASED_OUTPATIENT_CLINIC_OR_DEPARTMENT_OTHER)
Admission: EM | Admit: 2020-05-05 | Discharge: 2020-05-05 | Disposition: A | Discharge status: Home / Self Care | Payer: Medicaid Other | Attending: Emergency Medicine | Admitting: Emergency Medicine

## 2020-05-05 ENCOUNTER — Encounter (HOSPITAL_BASED_OUTPATIENT_CLINIC_OR_DEPARTMENT_OTHER): Payer: Self-pay

## 2020-05-05 DIAGNOSIS — F17210 Nicotine dependence, cigarettes, uncomplicated: Secondary | ICD-10-CM | POA: Diagnosis not present

## 2020-05-05 DIAGNOSIS — L089 Local infection of the skin and subcutaneous tissue, unspecified: Secondary | ICD-10-CM | POA: Insufficient documentation

## 2020-05-05 DIAGNOSIS — T8140XA Infection following a procedure, unspecified, initial encounter: Secondary | ICD-10-CM | POA: Diagnosis not present

## 2020-05-05 MED ORDER — CEPHALEXIN 500 MG PO CAPS
500.0000 mg | ORAL_CAPSULE | Freq: Two times a day (BID) | ORAL | 0 refills | Status: AC
Start: 2020-05-05 — End: 2020-05-12

## 2020-05-05 MED ORDER — PREDNISONE 5 MG PO TABS: ORAL_TABLET | ORAL | 0 refills | Status: AC

## 2020-05-05 MED FILL — CEPHALEXIN 500 MG CAPSULE: 500 | 7 days supply | Qty: 14 | Fill #0

## 2020-05-05 MED FILL — predniSONE 5 MG TABS: 5 | 14 days supply | Qty: 35 | Fill #0

## 2020-05-05 NOTE — ED Provider Notes (Signed)
[facility] EMERGENCY DEPARTMENT Provider Note   CSN: [id number] Arrival date & time: 05/05/20  5993     History Chief Complaint  Patient presents with  . Wound Check    Paul House is a 29 y.o. male.  The history is provided by the patient.  Illness Location:  Arms Severity:  Mild Onset quality:  Gradual Duration:  2 weeks Timing:  Constant Progression:  Worsening Chronicity:  Recurrent Context:  Patient with history of Behcet's syndrome here with arm wounds for the last 2 weeks. Relieved by:  Nothing Worsened by:  Nothing Associated symptoms: no fatigue, no fever and no rash        Past Medical History:  Diagnosis Date  . Behcet's disease (HCC)   . Herpes   . MRSA (methicillin resistant staph aureus) culture positive   . Recurrent mouth ulceration    "since 29 years old"  . Stomatitis and mucositis     Patient Active Problem List   Diagnosis Date Noted  . Behcet's disease (HCC) 09/27/2013  . Stomatitis and mucositis 03/15/2013  . Aphthous ulcer of mouth 03/15/2013  . Acute kidney injury (HCC) 07/08/2012  . Cigarette smoker 07/08/2012    Past Surgical History:  Procedure Laterality Date  . BIOPSY PHARYNX         Family History  Problem Relation Age of Onset  . Thyroid disease Mother   . Asthma Maternal Grandfather   . Lupus Neg Hx   . Crohn's disease Neg Hx     Social History   Tobacco Use  . Smoking status: Current Every Day Smoker    Packs/day: 0.50    Types: Cigarettes  . Smokeless tobacco: Never Used  Vaping Use  . Vaping Use: Never used  Substance Use Topics  . Alcohol use: Yes    Comment: occ  . Drug use: No    Home Medications Prior to Admission medications   Medication Sig Start Date End Date Taking? Authorizing Provider  cephALEXin (KEFLEX) 500 MG capsule Take 1 capsule (500 mg total) by mouth 2 (two) times daily for 7 days. 05/05/20 05/12/20  Kaydence Baba, DO  colchicine 0.6 MG tablet Take 1 tablet  (0.6 mg total) by mouth daily. 06/09/19   Marcine Matar, MD  hydrocortisone cream 1 % Apply to affected area 2 times daily 01/20/20   Lorelee New, PA-C  predniSONE (DELTASONE) 5 MG tablet Take three tablets a day for one week followed by two tablets a day for one week. 05/05/20   Brunilda Eble, DO  sucralfate (CARAFATE) 1 GM/10ML suspension Take 10 mLs (1 g total) by mouth 4 (four) times daily as needed. 04/10/20   Alvira Monday, MD    Allergies    Fluconazole  Review of Systems   Review of Systems  Constitutional: Negative for chills, fatigue and fever.  HENT: Negative for mouth sores, sinus pain and trouble swallowing.   Skin: Positive for wound (arms). Negative for color change, pallor and rash.    Physical Exam Updated Vital Signs  ED Triage Vitals  Enc Vitals Group     BP 05/05/20 0923 (!) 121/94     Pulse Rate 05/05/20 0923 69     Resp 05/05/20 0923 18     Temp 05/05/20 0923 98.7 F (37.1 C)     Temp Source 05/05/20 0923 Oral     SpO2 05/05/20 0923 98 %     Weight 05/05/20 0924 150 lb (68 kg)     Height  05/05/20 0924 6' (1.829 m)     Head Circumference --      Peak Flow --      Pain Score 05/05/20 0924 2     Pain Loc --      Pain Edu? --      Excl. in GC? --     Physical Exam Constitutional:      General: He is not in acute distress.    Appearance: He is not ill-appearing.  HENT:     Head: Normocephalic and atraumatic.     Nose: Nose normal.  Cardiovascular:     Pulses: Normal pulses.  Skin:    Capillary Refill: Capillary refill takes less than 2 seconds.     Comments: Multiple areas on bilateral arms of broken down skin with some mild erythema  Neurological:     Mental Status: He is alert.     ED Results / Procedures / Treatments   Labs (all labs ordered are listed, but only abnormal results are displayed) Labs Reviewed - No data to display  EKG None  Radiology No results found.  Procedures Procedures (including critical care  time)  Medications Ordered in ED Medications - No data to display  ED Course  I have reviewed the triage vital signs and the nursing notes.  Pertinent labs & imaging results that were available during my care of the patient were reviewed by me and considered in my medical decision making (see chart for details).    MDM Rules/Calculators/A&P                          Paul House is a 30 year old male with history of behcet syndrome who presents to the ED with arm wound.  Patient normal vitals.  No fever.  Patient with wounds likely consistent with Behcet's syndrome.  Will give steroids. Will also give antibiotics as concern for some wounds having a cellulitis around them as well.  No concern for systemic infection.  Understands return precautions and discharged from ED in good condition.  This chart was dictated using voice recognition software.  Despite best efforts to proofread,  errors can occur which can change the documentation meaning.    Final Clinical Impression(s) / ED Diagnoses Final diagnoses:  Wound infection    Rx / DC Orders ED Discharge Orders         Ordered    predniSONE (DELTASONE) 5 MG tablet     Discontinue  Reprint     05/05/20 0930    cephALEXin (KEFLEX) 500 MG capsule  2 times daily     Discontinue  Reprint     05/05/20 0930           Virgina Norfolk, DO 05/05/20 [phone]

## 2020-05-05 NOTE — ED Triage Notes (Signed)
Pt arrives ambulatory to ED reports history of Behcet syndrome with lesions on both arms X2 weeks.

## 2020-05-05 NOTE — Discharge Instructions (Signed)
Take medications as prescribed.  Follow-up with your primary care doctor.  Return if symptoms worsen.

## 2020-05-31 DIAGNOSIS — M352 Behcet's disease: Secondary | ICD-10-CM | POA: Diagnosis not present

## 2020-05-31 DIAGNOSIS — K1379 Other lesions of oral mucosa: Secondary | ICD-10-CM | POA: Diagnosis not present

## 2020-06-24 DIAGNOSIS — M352 Behcet's disease: Secondary | ICD-10-CM | POA: Diagnosis not present

## 2020-06-24 DIAGNOSIS — Z720 Tobacco use: Secondary | ICD-10-CM | POA: Diagnosis not present

## 2020-07-16 DIAGNOSIS — Z0000 Encounter for general adult medical examination without abnormal findings: Secondary | ICD-10-CM | POA: Diagnosis not present

## 2020-07-16 DIAGNOSIS — Z720 Tobacco use: Secondary | ICD-10-CM | POA: Diagnosis not present

## 2020-07-16 DIAGNOSIS — M352 Behcet's disease: Secondary | ICD-10-CM | POA: Diagnosis not present

## 2020-07-16 DIAGNOSIS — K121 Other forms of stomatitis: Secondary | ICD-10-CM | POA: Diagnosis not present

## 2020-07-16 DIAGNOSIS — Z113 Encounter for screening for infections with a predominantly sexual mode of transmission: Secondary | ICD-10-CM | POA: Diagnosis not present

## 2020-08-26 DIAGNOSIS — E559 Vitamin D deficiency, unspecified: Secondary | ICD-10-CM | POA: Diagnosis not present

## 2020-08-26 DIAGNOSIS — G8929 Other chronic pain: Secondary | ICD-10-CM | POA: Diagnosis not present

## 2020-08-26 DIAGNOSIS — M352 Behcet's disease: Secondary | ICD-10-CM | POA: Diagnosis not present

## 2020-08-26 DIAGNOSIS — M25561 Pain in right knee: Secondary | ICD-10-CM | POA: Diagnosis not present

## 2020-08-26 DIAGNOSIS — Z720 Tobacco use: Secondary | ICD-10-CM | POA: Diagnosis not present

## 2020-08-26 DIAGNOSIS — M25562 Pain in left knee: Secondary | ICD-10-CM | POA: Diagnosis not present

## 2020-08-26 DIAGNOSIS — Z113 Encounter for screening for infections with a predominantly sexual mode of transmission: Secondary | ICD-10-CM | POA: Diagnosis not present

## 2020-08-26 DIAGNOSIS — Z79899 Other long term (current) drug therapy: Secondary | ICD-10-CM | POA: Diagnosis not present

## 2020-10-07 DIAGNOSIS — B370 Candidal stomatitis: Secondary | ICD-10-CM | POA: Diagnosis not present

## 2020-11-22 DIAGNOSIS — M352 Behcet's disease: Secondary | ICD-10-CM | POA: Diagnosis not present

## 2020-11-30 DIAGNOSIS — Z720 Tobacco use: Secondary | ICD-10-CM | POA: Diagnosis not present

## 2020-11-30 DIAGNOSIS — E7800 Pure hypercholesterolemia, unspecified: Secondary | ICD-10-CM | POA: Diagnosis not present

## 2020-11-30 DIAGNOSIS — E559 Vitamin D deficiency, unspecified: Secondary | ICD-10-CM | POA: Diagnosis not present

## 2020-11-30 DIAGNOSIS — M25561 Pain in right knee: Secondary | ICD-10-CM | POA: Diagnosis not present

## 2020-11-30 DIAGNOSIS — G8929 Other chronic pain: Secondary | ICD-10-CM | POA: Diagnosis not present

## 2020-11-30 DIAGNOSIS — M352 Behcet's disease: Secondary | ICD-10-CM | POA: Diagnosis not present

## 2020-11-30 DIAGNOSIS — M25562 Pain in left knee: Secondary | ICD-10-CM | POA: Diagnosis not present

## 2020-12-20 DIAGNOSIS — B370 Candidal stomatitis: Secondary | ICD-10-CM | POA: Diagnosis not present

## 2020-12-20 DIAGNOSIS — M352 Behcet's disease: Secondary | ICD-10-CM | POA: Diagnosis not present

## 2021-01-05 DIAGNOSIS — B370 Candidal stomatitis: Secondary | ICD-10-CM | POA: Diagnosis not present

## 2021-01-05 DIAGNOSIS — M352 Behcet's disease: Secondary | ICD-10-CM | POA: Diagnosis not present

## 2021-02-24 DIAGNOSIS — J309 Allergic rhinitis, unspecified: Secondary | ICD-10-CM

## 2021-03-02 DIAGNOSIS — M352 Behcet's disease: Secondary | ICD-10-CM | POA: Diagnosis not present

## 2021-03-15 DIAGNOSIS — K1379 Other lesions of oral mucosa: Secondary | ICD-10-CM | POA: Diagnosis not present

## 2021-03-15 DIAGNOSIS — M352 Behcet's disease: Secondary | ICD-10-CM | POA: Diagnosis not present

## 2021-03-16 ENCOUNTER — Telehealth: Payer: Self-pay

## 2021-03-16 NOTE — Telephone Encounter (Signed)
Transition Care Management Unsuccessful Follow-up Telephone Call  Date of discharge and from where:  03/15/2021 from [facility]  Attempts:  1st Attempt  Reason for unsuccessful TCM follow-up call:  Left voice message

## 2021-03-17 NOTE — Telephone Encounter (Signed)
Transition Care Management Unsuccessful Follow-up Telephone Call  Date of discharge and from where:  03/15/2021 from [facility]  Attempts:  2nd Attempt  Reason for unsuccessful TCM follow-up call:  Left voice message

## 2021-03-18 NOTE — Telephone Encounter (Signed)
Transition Care Management Unsuccessful Follow-up Telephone Call  Date of discharge and from where:  03/15/2021 from [facility]  Attempts:  3rd Attempt  Reason for unsuccessful TCM follow-up call:  Unable to reach patient

## 2021-04-06 DIAGNOSIS — Z716 Tobacco abuse counseling: Secondary | ICD-10-CM | POA: Diagnosis not present

## 2021-04-06 DIAGNOSIS — M352 Behcet's disease: Secondary | ICD-10-CM | POA: Diagnosis not present

## 2021-05-07 DIAGNOSIS — M352 Behcet's disease: Secondary | ICD-10-CM | POA: Diagnosis not present

## 2021-05-09 ENCOUNTER — Telehealth: Payer: Self-pay

## 2021-05-09 NOTE — Telephone Encounter (Addendum)
Transition Care Management Follow-up Telephone Call Date of discharge and from where: 05/07/2021-[facility] How have you been since you were released from the hospital? Patient stated he is doing fine.  Any questions or concerns? No  Items Reviewed: Did the pt receive and understand the discharge instructions provided? Yes  Medications obtained and verified? Yes  Other? No  Any new allergies since your discharge? No  Dietary orders reviewed? N/A Do you have support at home? Yes   Home Care and Equipment/Supplies: Were home health services ordered? not applicable If so, what is the name of the agency? N/A  Has the agency set up a time to come to the patient's home? not applicable Were any new equipment or medical supplies ordered?  No What is the name of the medical supply agency? N/A Were you able to get the supplies/equipment? not applicable Do you have any questions related to the use of the equipment or supplies? No  Functional Questionnaire: (I = Independent and D = Dependent) ADLs: I  Bathing/Dressing- I  Meal Prep- I  Eating- I  Maintaining continence- I  Transferring/Ambulation- I  Managing Meds- I  Follow up appointments reviewed:  PCP Hospital f/u appt confirmed? No   Specialist Hospital f/u appt confirmed? No   Are transportation arrangements needed? No  If their condition worsens, is the pt aware to call PCP or go to the Emergency Dept.? Yes Was the patient provided with contact information for the PCP's office or ED? Yes Was to pt encouraged to call back with questions or concerns? Yes

## 2021-05-30 DIAGNOSIS — Z720 Tobacco use: Secondary | ICD-10-CM | POA: Diagnosis not present

## 2021-05-30 DIAGNOSIS — M352 Behcet's disease: Secondary | ICD-10-CM | POA: Diagnosis not present

## 2021-05-30 DIAGNOSIS — D649 Anemia, unspecified: Secondary | ICD-10-CM | POA: Diagnosis not present

## 2021-05-30 DIAGNOSIS — E559 Vitamin D deficiency, unspecified: Secondary | ICD-10-CM | POA: Diagnosis not present

## 2021-05-30 DIAGNOSIS — Z113 Encounter for screening for infections with a predominantly sexual mode of transmission: Secondary | ICD-10-CM | POA: Diagnosis not present

## 2021-06-06 DIAGNOSIS — B370 Candidal stomatitis: Secondary | ICD-10-CM | POA: Diagnosis not present

## 2021-06-06 DIAGNOSIS — M352 Behcet's disease: Secondary | ICD-10-CM | POA: Diagnosis not present

## 2021-07-04 DIAGNOSIS — M352 Behcet's disease: Secondary | ICD-10-CM | POA: Diagnosis not present

## 2021-07-05 ENCOUNTER — Telehealth: Payer: Self-pay

## 2021-07-05 NOTE — Telephone Encounter (Signed)
Transition Care Management Unsuccessful Follow-up Telephone Call  Date of discharge and from where:  07/04/2021-[facility]   Attempts:  1st Attempt  Reason for unsuccessful TCM follow-up call:  Left voice message  Patient may no longer be a patient of [facility].

## 2021-07-11 NOTE — Telephone Encounter (Signed)
Transition Care Management Unsuccessful Follow-up Telephone Call  Date of discharge and from where:  07/04/2021 from [facility]  Attempts:  2nd Attempt  Reason for unsuccessful TCM follow-up call:  Unable to reach patient

## 2021-07-12 NOTE — Telephone Encounter (Signed)
Transition Care Management Unsuccessful Follow-up Telephone Call  Date of discharge and from where:  07/04/2021 from [facility]  Attempts:  3rd Attempt  Reason for unsuccessful TCM follow-up call:  Unable to reach patient

## 2021-07-14 DIAGNOSIS — M352 Behcet's disease: Secondary | ICD-10-CM | POA: Diagnosis not present

## 2021-07-26 DIAGNOSIS — M352 Behcet's disease: Secondary | ICD-10-CM | POA: Diagnosis not present

## 2021-08-15 DIAGNOSIS — Z113 Encounter for screening for infections with a predominantly sexual mode of transmission: Secondary | ICD-10-CM | POA: Diagnosis not present

## 2021-08-15 DIAGNOSIS — E7800 Pure hypercholesterolemia, unspecified: Secondary | ICD-10-CM | POA: Diagnosis not present

## 2021-10-06 DIAGNOSIS — B370 Candidal stomatitis: Secondary | ICD-10-CM | POA: Diagnosis not present

## 2021-10-06 DIAGNOSIS — M352 Behcet's disease: Secondary | ICD-10-CM | POA: Diagnosis not present

## 2021-10-06 DIAGNOSIS — E7800 Pure hypercholesterolemia, unspecified: Secondary | ICD-10-CM | POA: Diagnosis not present

## 2021-10-06 DIAGNOSIS — I10 Essential (primary) hypertension: Secondary | ICD-10-CM | POA: Diagnosis not present

## 2022-01-04 DIAGNOSIS — D649 Anemia, unspecified: Secondary | ICD-10-CM | POA: Diagnosis not present

## 2022-01-04 DIAGNOSIS — I10 Essential (primary) hypertension: Secondary | ICD-10-CM | POA: Diagnosis not present

## 2022-01-04 DIAGNOSIS — Z131 Encounter for screening for diabetes mellitus: Secondary | ICD-10-CM | POA: Diagnosis not present

## 2022-01-04 DIAGNOSIS — E7800 Pure hypercholesterolemia, unspecified: Secondary | ICD-10-CM | POA: Diagnosis not present

## 2022-01-04 DIAGNOSIS — Z720 Tobacco use: Secondary | ICD-10-CM | POA: Diagnosis not present

## 2022-01-04 DIAGNOSIS — E782 Mixed hyperlipidemia: Secondary | ICD-10-CM | POA: Diagnosis not present

## 2022-01-04 DIAGNOSIS — L84 Corns and callosities: Secondary | ICD-10-CM | POA: Diagnosis not present

## 2022-01-04 DIAGNOSIS — M352 Behcet's disease: Secondary | ICD-10-CM | POA: Diagnosis not present

## 2022-01-04 DIAGNOSIS — E559 Vitamin D deficiency, unspecified: Secondary | ICD-10-CM | POA: Diagnosis not present

## 2022-05-01 DIAGNOSIS — Z79899 Other long term (current) drug therapy: Secondary | ICD-10-CM | POA: Diagnosis not present

## 2022-05-01 DIAGNOSIS — I10 Essential (primary) hypertension: Secondary | ICD-10-CM | POA: Diagnosis not present

## 2022-05-01 DIAGNOSIS — Z0001 Encounter for general adult medical examination with abnormal findings: Secondary | ICD-10-CM | POA: Diagnosis not present

## 2022-05-01 DIAGNOSIS — D7589 Other specified diseases of blood and blood-forming organs: Secondary | ICD-10-CM | POA: Diagnosis not present

## 2022-05-01 DIAGNOSIS — F17210 Nicotine dependence, cigarettes, uncomplicated: Secondary | ICD-10-CM | POA: Diagnosis not present

## 2022-05-01 DIAGNOSIS — Z0000 Encounter for general adult medical examination without abnormal findings: Secondary | ICD-10-CM | POA: Diagnosis not present

## 2022-05-01 DIAGNOSIS — M352 Behcet's disease: Secondary | ICD-10-CM | POA: Diagnosis not present

## 2022-05-01 DIAGNOSIS — E559 Vitamin D deficiency, unspecified: Secondary | ICD-10-CM | POA: Diagnosis not present

## 2022-05-01 DIAGNOSIS — E782 Mixed hyperlipidemia: Secondary | ICD-10-CM | POA: Diagnosis not present

## 2022-06-18 DIAGNOSIS — M352 Behcet's disease: Secondary | ICD-10-CM | POA: Diagnosis not present

## 2022-06-27 DIAGNOSIS — Z113 Encounter for screening for infections with a predominantly sexual mode of transmission: Secondary | ICD-10-CM | POA: Diagnosis not present

## 2022-06-27 DIAGNOSIS — M352 Behcet's disease: Secondary | ICD-10-CM | POA: Diagnosis not present

## 2022-06-27 DIAGNOSIS — Z79899 Other long term (current) drug therapy: Secondary | ICD-10-CM | POA: Diagnosis not present

## 2022-08-08 DIAGNOSIS — J069 Acute upper respiratory infection, unspecified: Secondary | ICD-10-CM | POA: Diagnosis not present

## 2022-08-08 DIAGNOSIS — Z20822 Contact with and (suspected) exposure to covid-19: Secondary | ICD-10-CM | POA: Diagnosis not present

## 2022-08-11 DIAGNOSIS — J069 Acute upper respiratory infection, unspecified: Secondary | ICD-10-CM | POA: Diagnosis not present

## 2022-11-23 DIAGNOSIS — M352 Behcet's disease: Secondary | ICD-10-CM | POA: Diagnosis not present

## 2022-11-23 DIAGNOSIS — I10 Essential (primary) hypertension: Secondary | ICD-10-CM | POA: Diagnosis not present

## 2022-11-23 DIAGNOSIS — E782 Mixed hyperlipidemia: Secondary | ICD-10-CM | POA: Diagnosis not present

## 2022-11-23 DIAGNOSIS — E559 Vitamin D deficiency, unspecified: Secondary | ICD-10-CM | POA: Diagnosis not present

## 2022-12-26 DIAGNOSIS — Z79899 Other long term (current) drug therapy: Secondary | ICD-10-CM | POA: Diagnosis not present

## 2022-12-26 DIAGNOSIS — M352 Behcet's disease: Secondary | ICD-10-CM | POA: Diagnosis not present

## 2023-04-22 DIAGNOSIS — M352 Behcet's disease: Secondary | ICD-10-CM | POA: Diagnosis not present

## 2023-05-25 DIAGNOSIS — E782 Mixed hyperlipidemia: Secondary | ICD-10-CM | POA: Diagnosis not present

## 2023-05-25 DIAGNOSIS — Z0000 Encounter for general adult medical examination without abnormal findings: Secondary | ICD-10-CM | POA: Diagnosis not present

## 2023-05-25 DIAGNOSIS — M352 Behcet's disease: Secondary | ICD-10-CM | POA: Diagnosis not present

## 2023-05-25 DIAGNOSIS — I10 Essential (primary) hypertension: Secondary | ICD-10-CM | POA: Diagnosis not present

## 2023-05-25 DIAGNOSIS — E559 Vitamin D deficiency, unspecified: Secondary | ICD-10-CM | POA: Diagnosis not present

## 2023-11-26 DIAGNOSIS — E559 Vitamin D deficiency, unspecified: Secondary | ICD-10-CM | POA: Diagnosis not present

## 2023-11-26 DIAGNOSIS — Z1159 Encounter for screening for other viral diseases: Secondary | ICD-10-CM | POA: Diagnosis not present

## 2023-11-26 DIAGNOSIS — M352 Behcet's disease: Secondary | ICD-10-CM | POA: Diagnosis not present

## 2023-11-26 DIAGNOSIS — I10 Essential (primary) hypertension: Secondary | ICD-10-CM | POA: Diagnosis not present

## 2023-11-26 DIAGNOSIS — E782 Mixed hyperlipidemia: Secondary | ICD-10-CM | POA: Diagnosis not present

## 2023-12-26 DIAGNOSIS — Z79899 Other long term (current) drug therapy: Secondary | ICD-10-CM | POA: Diagnosis not present

## 2023-12-26 DIAGNOSIS — M352 Behcet's disease: Secondary | ICD-10-CM | POA: Diagnosis not present

## 2024-06-13 DIAGNOSIS — Z87891 Personal history of nicotine dependence: Secondary | ICD-10-CM | POA: Diagnosis not present

## 2024-06-13 DIAGNOSIS — M352 Behcet's disease: Secondary | ICD-10-CM | POA: Diagnosis not present

## 2024-06-13 DIAGNOSIS — E559 Vitamin D deficiency, unspecified: Secondary | ICD-10-CM | POA: Diagnosis not present

## 2024-06-13 DIAGNOSIS — I10 Essential (primary) hypertension: Secondary | ICD-10-CM | POA: Diagnosis not present

## 2024-06-27 DIAGNOSIS — Z79899 Other long term (current) drug therapy: Secondary | ICD-10-CM | POA: Diagnosis not present

## 2024-06-27 DIAGNOSIS — M352 Behcet's disease: Secondary | ICD-10-CM | POA: Diagnosis not present
